# Patient Record
Sex: Female | Born: 1960 | Race: White | Hispanic: No | Marital: Single | Smoking: Current every day smoker
Health system: Southern US, Community
[De-identification: ages and names within clinical notes are randomized; demographics above are authoritative.]

## PROBLEM LIST (undated history)

## (undated) DIAGNOSIS — F4310 Post-traumatic stress disorder, unspecified: Secondary | ICD-10-CM

## (undated) DIAGNOSIS — M5136 Other intervertebral disc degeneration, lumbar region: Secondary | ICD-10-CM

## (undated) DIAGNOSIS — M549 Dorsalgia, unspecified: Secondary | ICD-10-CM

## (undated) DIAGNOSIS — F32A Depression, unspecified: Secondary | ICD-10-CM

## (undated) DIAGNOSIS — J449 Chronic obstructive pulmonary disease, unspecified: Secondary | ICD-10-CM

## (undated) DIAGNOSIS — S060XAA Concussion with loss of consciousness status unknown, initial encounter: Secondary | ICD-10-CM

## (undated) DIAGNOSIS — M51369 Other intervertebral disc degeneration, lumbar region without mention of lumbar back pain or lower extremity pain: Secondary | ICD-10-CM

## (undated) DIAGNOSIS — F329 Major depressive disorder, single episode, unspecified: Secondary | ICD-10-CM

## (undated) DIAGNOSIS — S060X9A Concussion with loss of consciousness of unspecified duration, initial encounter: Secondary | ICD-10-CM

## (undated) DIAGNOSIS — J45909 Unspecified asthma, uncomplicated: Secondary | ICD-10-CM

## (undated) HISTORY — DX: Unspecified asthma, uncomplicated: J45.909

## (undated) HISTORY — DX: Chronic obstructive pulmonary disease, unspecified: J44.9

## (undated) HISTORY — DX: Other intervertebral disc degeneration, lumbar region: M51.36

## (undated) HISTORY — PX: TUBAL LIGATION: SHX77

## (undated) HISTORY — PX: TIBIA FRACTURE SURGERY: SHX806

## (undated) HISTORY — DX: Concussion with loss of consciousness status unknown, initial encounter: S06.0XAA

## (undated) HISTORY — DX: Other intervertebral disc degeneration, lumbar region without mention of lumbar back pain or lower extremity pain: M51.369

## (undated) HISTORY — PX: WRIST SURGERY: SHX841

## (undated) HISTORY — DX: Concussion with loss of consciousness of unspecified duration, initial encounter: S06.0X9A

---

## 2009-02-11 ENCOUNTER — Emergency Department: Payer: Self-pay | Admitting: Emergency Medicine

## 2009-12-31 ENCOUNTER — Emergency Department: Payer: Self-pay | Admitting: Emergency Medicine

## 2011-03-05 ENCOUNTER — Emergency Department (HOSPITAL_COMMUNITY): Payer: Medicaid Other

## 2011-03-05 ENCOUNTER — Emergency Department (HOSPITAL_COMMUNITY)
Admission: EM | Admit: 2011-03-05 | Discharge: 2011-03-05 | Disposition: A | Discharge status: Home / Self Care | Payer: Medicaid Other | Attending: Emergency Medicine | Admitting: Emergency Medicine

## 2011-03-05 DIAGNOSIS — S2239XA Fracture of one rib, unspecified side, initial encounter for closed fracture: Secondary | ICD-10-CM | POA: Insufficient documentation

## 2011-03-05 DIAGNOSIS — W010XXA Fall on same level from slipping, tripping and stumbling without subsequent striking against object, initial encounter: Secondary | ICD-10-CM | POA: Insufficient documentation

## 2011-05-07 ENCOUNTER — Emergency Department (HOSPITAL_COMMUNITY)
Admission: EM | Admit: 2011-05-07 | Discharge: 2011-05-08 | Disposition: A | Discharge status: Home / Self Care | Payer: Self-pay | Attending: Emergency Medicine | Admitting: Emergency Medicine

## 2011-05-07 DIAGNOSIS — F329 Major depressive disorder, single episode, unspecified: Secondary | ICD-10-CM | POA: Insufficient documentation

## 2011-05-07 DIAGNOSIS — F3289 Other specified depressive episodes: Secondary | ICD-10-CM | POA: Insufficient documentation

## 2011-05-07 LAB — DIFFERENTIAL
Basophils Absolute: 0 10*3/uL (ref 0.0–0.1)
Basophils Relative: 1 % (ref 0–1)
Eosinophils Absolute: 0.1 10*3/uL (ref 0.0–0.7)
Eosinophils Relative: 3 % (ref 0–5)
Lymphocytes Relative: 54 % — ABNORMAL HIGH (ref 12–46)
Lymphs Abs: 2.8 10*3/uL (ref 0.7–4.0)
Monocytes Absolute: 0.5 10*3/uL (ref 0.1–1.0)
Monocytes Relative: 9 % (ref 3–12)
Neutro Abs: 1.8 10*3/uL (ref 1.7–7.7)
Neutrophils Relative %: 34 % — ABNORMAL LOW (ref 43–77)

## 2011-05-07 LAB — URINALYSIS, ROUTINE W REFLEX MICROSCOPIC
Bilirubin Urine: NEGATIVE
Glucose, UA: NEGATIVE mg/dL
Ketones, ur: NEGATIVE mg/dL
Nitrite: NEGATIVE
Protein, ur: NEGATIVE mg/dL
Specific Gravity, Urine: 1.006 (ref 1.005–1.030)
Urobilinogen, UA: 0.2 mg/dL (ref 0.0–1.0)
pH: 5.5 (ref 5.0–8.0)

## 2011-05-07 LAB — RAPID URINE DRUG SCREEN, HOSP PERFORMED
Amphetamines: NOT DETECTED
Barbiturates: NOT DETECTED
Benzodiazepines: NOT DETECTED
Cocaine: NOT DETECTED
Opiates: NOT DETECTED
Tetrahydrocannabinol: NOT DETECTED

## 2011-05-07 LAB — URINE MICROSCOPIC-ADD ON

## 2011-05-07 LAB — CBC
HCT: 38.3 % (ref 36.0–46.0)
Hemoglobin: 13.7 g/dL (ref 12.0–15.0)
MCH: 35.7 pg — ABNORMAL HIGH (ref 26.0–34.0)
MCHC: 35.8 g/dL (ref 30.0–36.0)
MCV: 99.7 fL (ref 78.0–100.0)
Platelets: 178 10*3/uL (ref 150–400)
RBC: 3.84 MIL/uL — ABNORMAL LOW (ref 3.87–5.11)
RDW: 12.7 % (ref 11.5–15.5)
WBC: 5.2 10*3/uL (ref 4.0–10.5)

## 2011-05-07 LAB — POCT PREGNANCY, URINE: Preg Test, Ur: NEGATIVE

## 2011-05-08 LAB — BASIC METABOLIC PANEL
BUN: 6 mg/dL (ref 6–23)
CO2: 20 mEq/L (ref 19–32)
Calcium: 8.9 mg/dL (ref 8.4–10.5)
Chloride: 95 mEq/L — ABNORMAL LOW (ref 96–112)
Creatinine, Ser: 0.62 mg/dL (ref 0.50–1.10)
GFR calc Af Amer: >60 mL/min (ref >60)
GFR calc non Af Amer: >60 mL/min (ref >60)
Glucose, Bld: 69 mg/dL — ABNORMAL LOW (ref 70–99)
Potassium: 3.8 mEq/L (ref 3.5–5.1)
Sodium: 132 mEq/L — ABNORMAL LOW (ref 135–145)

## 2011-05-08 LAB — ETHANOL: Alcohol, Ethyl (B): 294 mg/dL — ABNORMAL HIGH (ref 0–11)

## 2011-08-17 ENCOUNTER — Encounter: Payer: Self-pay | Admitting: *Deleted

## 2011-08-17 ENCOUNTER — Emergency Department (HOSPITAL_COMMUNITY)
Admission: EM | Admit: 2011-08-17 | Discharge: 2011-08-17 | Disposition: A | Discharge status: Home / Self Care | Payer: Self-pay | Attending: Emergency Medicine | Admitting: Emergency Medicine

## 2011-08-17 DIAGNOSIS — M5380 Other specified dorsopathies, site unspecified: Secondary | ICD-10-CM | POA: Insufficient documentation

## 2011-08-17 DIAGNOSIS — M545 Low back pain, unspecified: Secondary | ICD-10-CM | POA: Insufficient documentation

## 2011-08-17 DIAGNOSIS — M5430 Sciatica, unspecified side: Secondary | ICD-10-CM | POA: Insufficient documentation

## 2011-08-17 DIAGNOSIS — Z79899 Other long term (current) drug therapy: Secondary | ICD-10-CM | POA: Insufficient documentation

## 2011-08-17 DIAGNOSIS — M79609 Pain in unspecified limb: Secondary | ICD-10-CM | POA: Insufficient documentation

## 2011-08-17 HISTORY — DX: Post-traumatic stress disorder, unspecified: F43.10

## 2011-08-17 MED ORDER — NAPROXEN 500 MG PO TABS: 500.0000 mg | ORAL_TABLET | Freq: Two times a day (BID) | ORAL | Status: DC

## 2011-08-17 MED ORDER — CYCLOBENZAPRINE HCL 5 MG PO TABS: 5.0000 mg | ORAL_TABLET | Freq: Three times a day (TID) | ORAL | Status: AC | PRN

## 2011-08-17 MED ORDER — HYDROCODONE-ACETAMINOPHEN 5-325 MG PO TABS: 1.0000 | ORAL_TABLET | Freq: Four times a day (QID) | ORAL | Status: AC | PRN

## 2011-08-17 NOTE — ED Notes (Signed)
x2 weeks lower back pain' "pinched nerve." pain radiating down rt. leg

## 2011-08-17 NOTE — ED Provider Notes (Signed)
History     CSN: [id number] Arrival date & time: 08/17/2011  8:57 AM   First MD Initiated Contact with Patient 08/17/11 [phone]      No chief complaint on file.   (Consider location/radiation/quality/duration/timing/severity/associated sxs/prior treatment) HPI Comments: The pain started after doing some lifting and moving.  Patient denies any falls. She does feel like the pain is sharp it shoots down her left leg and sometimes it feels like her leg will give out on her.  Patient is a 50 y.o. female presenting with back pain. The history is provided by the patient.  Back Pain  This is a new problem. Episode onset: The pain has been ongoing for several weeks. The problem has been gradually worsening. The pain is associated with lifting heavy objects. The pain is present in the lumbar spine. The quality of the pain is described as shooting and stabbing. The pain is moderate. The symptoms are aggravated by bending and certain positions. Associated symptoms include leg pain. Pertinent negatives include no fever, no abdominal swelling, no perianal numbness, no dysuria, no paresis and no weakness. She has tried NSAIDs and heat for the symptoms. The treatment provided mild relief.    No past medical history on file.  No past surgical history on file.  No family history on file.  History  Substance Use Topics  . Smoking status: Not on file  . Smokeless tobacco: Not on file  . Alcohol Use: Not on file    OB History    No data available      Review of Systems  Constitutional: Negative for fever.  Genitourinary: Negative for dysuria.  Musculoskeletal: Positive for back pain.  Neurological: Negative for weakness.  All other systems reviewed and are negative.    Allergies  Review of patient's allergies indicates no known allergies.  Home Medications   Current Outpatient Rx  Name Route Sig Dispense Refill  . HYDROXYZINE HCL 25 MG PO TABS Oral Take 25 mg by mouth 4 (four) times  daily as needed. For anxiety or sleep     . SERTRALINE HCL 100 MG PO TABS Oral Take 50 mg by mouth daily.      . CYCLOBENZAPRINE HCL 5 MG PO TABS Oral Take 1 tablet (5 mg total) by mouth 3 (three) times daily as needed for muscle spasms. 21 tablet 0  . HYDROCODONE-ACETAMINOPHEN 5-325 MG PO TABS Oral Take 1 tablet by mouth every 6 (six) hours as needed for pain. 20 tablet 0  . NAPROXEN 500 MG PO TABS Oral Take 1 tablet (500 mg total) by mouth 2 (two) times daily with a meal. As needed for pain 20 tablet 0    BP 113/80  Pulse 110  Temp(Src) 97 F (36.1 C) (Oral)  Resp 20  SpO2 96%  Physical Exam  Nursing note and vitals reviewed. Constitutional: She appears well-developed and well-nourished.  HENT:  Head: Normocephalic and atraumatic.  Right Ear: External ear normal.  Left Ear: External ear normal.  Nose: Nose normal.  Eyes: Conjunctivae and EOM are normal.  Neck: Neck supple. No tracheal deviation present.  Pulmonary/Chest: Effort normal. No stridor. No respiratory distress.  Abdominal: She exhibits no distension. There is no tenderness.  Musculoskeletal: She exhibits no edema and no tenderness.       Lumbar back: She exhibits decreased range of motion, tenderness, pain and spasm. She exhibits no swelling and no edema.  Neurological: She is alert. She is not disoriented. No cranial nerve deficit or sensory deficit.  She exhibits normal muscle tone. Coordination normal.  Reflex Scores:      Patellar reflexes are 2+ on the right side and 2+ on the left side.      Achilles reflexes are 2+ on the right side and 2+ on the left side. Skin: Skin is warm and dry. No rash noted. She is not diaphoretic. No erythema.  Psychiatric: She has a normal mood and affect. Her behavior is normal. Thought content normal.    ED Course  Procedures (including critical care time)  Labs Reviewed - No data to display No results found.   1. Sciatica       MDM  No sign of acute neurological or  vascular emergency associated with pt's back pain.  May have a component of sciatica.  Safe for outpatient follow up.         Celene Kras, MD 08/17/11 [phone]

## 2011-09-30 ENCOUNTER — Emergency Department: Payer: Self-pay | Admitting: Unknown Physician Specialty

## 2011-10-22 ENCOUNTER — Other Ambulatory Visit: Payer: Self-pay | Admitting: Anesthesiology

## 2011-10-22 DIAGNOSIS — M79604 Pain in right leg: Secondary | ICD-10-CM

## 2011-10-24 ENCOUNTER — Ambulatory Visit
Admission: RE | Admit: 2011-10-24 | Discharge: 2011-10-24 | Disposition: A | Discharge status: Home / Self Care | Payer: Self-pay | Source: Ambulatory Visit | Attending: Anesthesiology | Admitting: Anesthesiology

## 2011-10-24 DIAGNOSIS — M79605 Pain in left leg: Secondary | ICD-10-CM

## 2011-10-24 DIAGNOSIS — M79604 Pain in right leg: Secondary | ICD-10-CM

## 2011-10-25 ENCOUNTER — Other Ambulatory Visit: Payer: Self-pay

## 2011-11-29 ENCOUNTER — Encounter (HOSPITAL_COMMUNITY): Payer: Self-pay | Admitting: Physical Medicine and Rehabilitation

## 2011-11-29 ENCOUNTER — Emergency Department (HOSPITAL_COMMUNITY)
Admission: EM | Admit: 2011-11-29 | Discharge: 2011-11-29 | Disposition: A | Discharge status: Home / Self Care | Payer: Self-pay | Attending: Emergency Medicine | Admitting: Emergency Medicine

## 2011-11-29 DIAGNOSIS — G8929 Other chronic pain: Secondary | ICD-10-CM | POA: Insufficient documentation

## 2011-11-29 DIAGNOSIS — M47817 Spondylosis without myelopathy or radiculopathy, lumbosacral region: Secondary | ICD-10-CM | POA: Insufficient documentation

## 2011-11-29 DIAGNOSIS — F17200 Nicotine dependence, unspecified, uncomplicated: Secondary | ICD-10-CM | POA: Insufficient documentation

## 2011-11-29 DIAGNOSIS — F4310 Post-traumatic stress disorder, unspecified: Secondary | ICD-10-CM | POA: Insufficient documentation

## 2011-11-29 HISTORY — DX: Major depressive disorder, single episode, unspecified: F32.9

## 2011-11-29 HISTORY — DX: Depression, unspecified: F32.A

## 2011-11-29 LAB — URINALYSIS, ROUTINE W REFLEX MICROSCOPIC
Bilirubin Urine: NEGATIVE
Glucose, UA: NEGATIVE mg/dL
Hgb urine dipstick: NEGATIVE
Ketones, ur: NEGATIVE mg/dL
Nitrite: NEGATIVE
Protein, ur: NEGATIVE mg/dL
Specific Gravity, Urine: 1.008 (ref 1.005–1.030)
Urobilinogen, UA: 0.2 mg/dL (ref 0.0–1.0)
pH: 7 (ref 5.0–8.0)

## 2011-11-29 LAB — URINE MICROSCOPIC-ADD ON

## 2011-11-29 MED ORDER — HYDROCODONE-ACETAMINOPHEN 5-325 MG PO TABS
1.0000 | ORAL_TABLET | Freq: Once | ORAL | Status: AC
  Administered 2011-11-29: 1 via ORAL
  Filled 2011-11-29: qty 1

## 2011-11-29 MED ORDER — HYDROCODONE-ACETAMINOPHEN 5-325 MG PO TABS: 1.0000 | ORAL_TABLET | Freq: Four times a day (QID) | ORAL | Status: AC | PRN

## 2011-11-29 NOTE — ED Provider Notes (Signed)
History     CSN: [id number]  Arrival date & time 11/29/11  1013   First MD Initiated Contact with Patient 11/29/11 1024      Chief Complaint  Patient presents with  . Back Pain    (Consider location/radiation/quality/duration/timing/severity/associated sxs/prior treatment) HPI The patient is a 51 yo woman, presenting with back pain.  She notes a history of lower back pain, with shooting pain radiating to both legs, since an injury while moving heavy furniture in 08/2011.  She later had another accident involving tripping over a rope.  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  Since then, the patient has been taking tramadol and flexeril, which initially helped with the pain.  However, now her pain has been gradually increasing, causing her to seek medical attention.  She notes no new injury since her last MRI, and no loss of bowel/bladder function or motor dysfunction.  Past Medical History  Diagnosis Date  . Post traumatic stress disorder (PTSD)   . Depression     History reviewed. No pertinent past surgical history.  History reviewed. No pertinent family history.  History  Substance Use Topics  . Smoking status: Current Everyday Smoker    Types: Cigarettes  . Smokeless tobacco: Not on file  . Alcohol Use: Yes    OB History    Grav Para Term Preterm Abortions TAB SAB Ect Mult Living                  Review of Systems ROS General: no fevers, chills, changes in weight, changes in appetite Skin: no rash HEENT: no blurry vision, hearing changes, sore throat Pulm: no dyspnea, coughing, wheezing CV: no chest pain, palpitations, shortness of breath Abd: no abdominal pain, nausea/vomiting, diarrhea/constipation GU: no dysuria, hematuria, polyuria Ext: no arthralgias, myalgias Neuro: no  weakness, numbness, or tingling  Allergies  Review of patient's allergies indicates no known allergies.  Home Medications   Current Outpatient Rx  Name Route Sig Dispense Refill  . CYCLOBENZAPRINE HCL 10 MG PO TABS Oral Take 10 mg by mouth every 12 (twelve) hours as needed. As needed for muscle spasms.    Marland Kitchen HYDROXYZINE HCL 25 MG PO TABS Oral Take 25 mg by mouth 4 (four) times daily as needed. For anxiety or sleep     . SERTRALINE HCL 100 MG PO TABS Oral Take 50 mg by mouth daily.      . TRAMADOL HCL 50 MG PO TABS Oral Take 50 mg by mouth every 12 (twelve) hours as needed. As needed for pain.      BP 147/89  Pulse 103  Temp(Src) 97.7 F (36.5 C) (Oral)  Resp 20  SpO2 99%  LMP 06/17/2011  Physical Exam General: alert, cooperative, and in no apparent distress HEENT: pupils equal round and reactive to light, vision grossly intact, oropharynx clear and non-erythematous  Neck: supple, no lymphadenopathy Lungs: clear to ascultation bilaterally, normal work of respiration, no wheezes, rales, ronchi Heart: regular rate and rhythm, no murmurs, gallops, or rubs Abdomen: soft, non-tender, non-distended, normal bowel sounds   Back: ttp at L4-L5 vertebrae, mild tenderness to paraspinal muscles Extremities: 2+ DP/PT pulses bilaterally, no cyanosis, clubbing, or edema Neurologic: alert & oriented X3, cranial nerves II-XII intact, strength 5/5 in all 4 extremities, sensation intact to light touch, reflexes 2+ throughout bilateral LE  ED Course  Procedures (including critical care time)   Labs Reviewed  URINALYSIS, ROUTINE W REFLEX MICROSCOPIC   No results found.   No diagnosis found.    MDM  The patient is a 51 yo woman, history of back pain, presenting with persistent back pain.  # Back pain - she notes chronic back pain, with MRI imaging 1 month ago showing vertebral changes.  Flexeril and tramadol is giving diminishing results.  Unfortunately, this is a chronic problem, that will  require multiple visits to a PCP, and possibly an Orthopedic Surgeon +/- a pain clinic.  The first step will be establishing a PCP, as the patient is currently dissatisfied with her current provider. -hydrocodone now for pain -will prescribe short-term course of hydrocodone for home use -patient provided with list of PCP's in the area  # Dispo - discharge, patient to establish care with PCP   Linward Headland, MD 11/29/11 1100

## 2011-11-29 NOTE — ED Notes (Signed)
Pt presents to department for evaluation of lower back pain. Ongoing x1 month. Pt states she tripped on rope and injured back. 9/10 pain upon arrival to ED. Pt also states urinary frequency. Pain becomes with walking. No relief from tramadol at home. She is conscious alert and oriented x4.

## 2011-11-29 NOTE — Discharge Instructions (Signed)
RESOURCE GUIDE  Dental Problems  Patients with Medicaid: [facility]                     [facility] [address]                                           [address] Phone:  [phone]                                                  Phone:  [phone]  If unable to pay or uninsured, contact:  [facility] or [facility] to become qualified for the adult dental clinic.  Chronic Pain Problems Contact [facility]  [phone] Patients need to be referred by their primary care doctor.  Insufficient Money for Medicine Contact [employer]:  call "211" or [facility] 271-5999.  No Primary Care Doctor Call [facility]  832-8000 Other agencies that provide inexpensive medical care    [facility]  [phone]    [facility]  [phone]    [facility]  [phone]    Women's Clinic  [phone]    [facility]  [phone]    [facility]  [phone]  Psychological Services [facility]  [phone] [facility]  [phone] [facility]   800 853-5163 (emergency services 641-4993)  Substance Abuse Resources [facility]  336-882-2125 [facility] 336-784-9470 [facility] 336-285-9073 [facility] 336-845-3988 Residential & Outpatient Substance Abuse Program  800-659-3381  Abuse/Neglect [place] Child Abuse Hotline (336) 641-3795 [place] Child Abuse Hotline 800-378-5315 (After Hours)  Emergency Shelter [facility] (336) 271-5985  Maternity Homes [facility] (336) 275-9566 [facility] (704) 372-4663  MRSA Hotline #:   832-7006    [place] [facility]  [facility] [place] [facility]     [employer]                          [facility] [address] [place]                       [address]      [address]  [place]                                                [place]                            [place] Phone:  [phone]                                   Phone:  [phone]                 Phone:  [phone]  [facility] Phone:  [phone]    [facility] Child Abuse Hotline 915-285-0409 (281)303-0085 (After Hours)   Chronic Back Pain When back pain lasts longer than 3 months, it is called chronic back pain.This pain can be frustrating, but the cause of the pain is rarely dangerous.People with chronic back pain often go through certain periods that are more intense (flare-ups). CAUSES Chronic back pain can be caused by wear and tear (degeneration) on different structures in your back. These structures may include bones, ligaments, or discs. This degeneration may result in more pressure being placed on the nerves that travel to your legs and feet. This can lead to pain traveling from the low back down the back of the legs. When pain lasts longer than 3 months, it is not unusual for people to experience anxiety or depression. Anxiety and depression can also contribute to low back pain. TREATMENT  Establish a regular exercise plan. This is critical to improving your functional level.   Have a self-management plan for when you flare-up. Flare-ups rarely require a medical visit. Regular exercise will help reduce the intensity and frequency of your flare-ups.   Manage how you feel about your back pain and the rest of your life. Anxiety, depression, and feeling that you cannot alter your back pain have been shown to make back pain more intense and debilitating.   Medicines should never be your only treatment. They should be used along with other treatments to help you return to a more active lifestyle.   Procedures such as injections or surgery may be helpful but are rarely necessary. You may be able to get the same results with physical therapy or  chiropractic care.  HOME CARE INSTRUCTIONS  Avoid bending, heavy lifting, prolonged sitting, and activities which make the problem worse.   Continue normal activity as much as possible.   Take brief periods of rest throughout the day to reduce your pain during flare-ups.   Follow your back exercise rehabilitation program. This can help reduce symptoms and prevent more pain.   Only take over-the-counter or prescription medicines as directed by your caregiver. Muscle relaxants are sometimes prescribed. Narcotic pain medicine is discouraged for long-term pain, since addiction is a possible outcome.   If you smoke, quit.   Eat healthy foods and maintain a recommended body weight.  SEEK IMMEDIATE MEDICAL CARE IF:   You have weakness or numbness in one of your legs or feet.   You have trouble controlling your bladder or bowels.   You develop nausea, vomiting, abdominal pain, shortness of breath, or fainting.  Document Released: 09/27/2004 Document Revised: 08/09/2011 Document Reviewed: 08/04/2011 [facility] Patient Information 2012 [place], [place].

## 2011-11-29 NOTE — ED Provider Notes (Signed)
I saw and evaluated the patient, reviewed the resident's note and I agree with the findings and plan.  I saw the patient along with Dr. Manson Passey and agree with his note, assessment, and plan.  The patient presents with back pain and has a known history of abnormal mri last month.  She is having difficulty obtaining follow up due to financial reasons.  Her pain worsened several days ago and she has no relief with tramadol and flexeril.  There are no bowel or bladder complaints.  On exam, the patient appears uncomfortable but the vitals are stable. DTR's are symmetrical and strength is 5/5 in ble.  She is ambulatory without difficulty.  She will be given lortab for pain and is given the resource guide to help her to arrange follow up.  Geoffery Lyons, MD 11/29/11 1525

## 2011-12-26 ENCOUNTER — Emergency Department (HOSPITAL_COMMUNITY)
Admission: EM | Admit: 2011-12-26 | Discharge: 2011-12-26 | Disposition: A | Discharge status: Home / Self Care | Payer: Self-pay | Attending: Emergency Medicine | Admitting: Emergency Medicine

## 2011-12-26 ENCOUNTER — Encounter (HOSPITAL_COMMUNITY): Payer: Self-pay | Admitting: *Deleted

## 2011-12-26 DIAGNOSIS — M79609 Pain in unspecified limb: Secondary | ICD-10-CM | POA: Insufficient documentation

## 2011-12-26 DIAGNOSIS — IMO0001 Reserved for inherently not codable concepts without codable children: Secondary | ICD-10-CM | POA: Insufficient documentation

## 2011-12-26 DIAGNOSIS — G8929 Other chronic pain: Secondary | ICD-10-CM | POA: Insufficient documentation

## 2011-12-26 DIAGNOSIS — M549 Dorsalgia, unspecified: Secondary | ICD-10-CM

## 2011-12-26 DIAGNOSIS — M545 Low back pain, unspecified: Secondary | ICD-10-CM | POA: Insufficient documentation

## 2011-12-26 DIAGNOSIS — F329 Major depressive disorder, single episode, unspecified: Secondary | ICD-10-CM | POA: Insufficient documentation

## 2011-12-26 DIAGNOSIS — IMO0002 Reserved for concepts with insufficient information to code with codable children: Secondary | ICD-10-CM | POA: Insufficient documentation

## 2011-12-26 DIAGNOSIS — Z79899 Other long term (current) drug therapy: Secondary | ICD-10-CM | POA: Insufficient documentation

## 2011-12-26 DIAGNOSIS — F3289 Other specified depressive episodes: Secondary | ICD-10-CM | POA: Insufficient documentation

## 2011-12-26 DIAGNOSIS — M5416 Radiculopathy, lumbar region: Secondary | ICD-10-CM

## 2011-12-26 DIAGNOSIS — F17200 Nicotine dependence, unspecified, uncomplicated: Secondary | ICD-10-CM | POA: Insufficient documentation

## 2011-12-26 DIAGNOSIS — Z7982 Long term (current) use of aspirin: Secondary | ICD-10-CM | POA: Insufficient documentation

## 2011-12-26 MED ORDER — HYDROCODONE-ACETAMINOPHEN 5-500 MG PO TABS: 1.0000 | ORAL_TABLET | Freq: Four times a day (QID) | ORAL | Status: AC | PRN

## 2011-12-26 MED ORDER — HYDROCODONE-ACETAMINOPHEN 5-325 MG PO TABS
2.0000 | ORAL_TABLET | Freq: Once | ORAL | Status: AC
  Administered 2011-12-26: 2 via ORAL
  Filled 2011-12-26: qty 2

## 2011-12-26 MED ORDER — NAPROXEN 375 MG PO TABS: 375.0000 mg | ORAL_TABLET | Freq: Two times a day (BID) | ORAL | Status: AC

## 2011-12-26 NOTE — ED Provider Notes (Signed)
History     CSN: [id number]  Arrival date & time 12/26/11  1712   First MD Initiated Contact with Patient 12/26/11 1732      Chief Complaint  Patient presents with  . Back Pain    (Consider location/radiation/quality/duration/timing/severity/associated sxs/prior treatment) HPI  Patient presents to emergency department with complaint of left lower back pain with radiation of pain into her left buttock and leg. Patient states that she has been seen in emergency department on numerous occasions for this pain. She was last seen last month and was told that she needed to establish care with primary care provider. Patient states that she has a appointment in the near future to establish her orange card and to get her primary care provider. Patient states that over the last few months she's had multiple episodes of flares of her chronic pain. Patient states her current symptoms are consistent with her flares her chronic pain. Patient states that initially, multiple month ago, the pain was aggravated by sudden movement. However today patient states that she was standing in her kitchen and twisted to look behind her when the pain began once again. Patient denies extremity numbness/tingling/weakness, saddle seat paresthesias, loss of bowel or bladder function. She denies any abdominal pain, dysuria, or flank pain. Patient states that she has taken Vicodin, Percocet, and Ultram in the past for her pain. Patient states she most recently took day an Ultram without relief of pain. Pain is aggravated by movement. Past Medical History  Diagnosis Date  . Post traumatic stress disorder (PTSD)   . Depression     History reviewed. No pertinent past surgical history.  History reviewed. No pertinent family history.  History  Substance Use Topics  . Smoking status: Current Everyday Smoker    Types: Cigarettes  . Smokeless tobacco: Not on file  . Alcohol Use: Yes    OB History    Grav Para Term Preterm  Abortions TAB SAB Ect Mult Living                  Review of Systems  All other systems reviewed and are negative.    Allergies  Review of patient's allergies indicates no known allergies.  Home Medications   Current Outpatient Rx  Name Route Sig Dispense Refill  . ASPIRIN EC 81 MG PO TBEC Oral Take 81 mg by mouth daily.    . CYCLOBENZAPRINE HCL 10 MG PO TABS Oral Take 10 mg by mouth every 12 (twelve) hours as needed. As needed for muscle spasms.    Marland Kitchen HYDROXYZINE HCL 25 MG PO TABS Oral Take 25 mg by mouth 4 (four) times daily as needed. For anxiety or sleep     . SERTRALINE HCL 100 MG PO TABS Oral Take 100 mg by mouth daily.     . TRAMADOL HCL 50 MG PO TABS Oral Take 50 mg by mouth every 12 (twelve) hours as needed. As needed for pain.    Marland Kitchen HYDROCODONE-ACETAMINOPHEN 5-500 MG PO TABS Oral Take 1-2 tablets by mouth every 6 (six) hours as needed for pain. 20 tablet 0  . NAPROXEN 375 MG PO TABS Oral Take 1 tablet (375 mg total) by mouth 2 (two) times daily. 30 tablet 0    BP 132/90  Pulse 112  Temp(Src) 98.2 F (36.8 C) (Oral)  Resp 18  SpO2 97%  LMP 06/17/2011  Physical Exam  Nursing note and vitals reviewed. Constitutional: She is oriented to person, place, and time. She appears well-developed and  well-nourished. No distress.  HENT:  Head: Normocephalic and atraumatic.  Eyes: Conjunctivae are normal.  Neck: Normal range of motion. Neck supple.  Cardiovascular: Normal rate, regular rhythm, normal heart sounds and intact distal pulses.  Exam reveals no gallop and no friction rub.   No murmur heard. Pulmonary/Chest: Effort normal and breath sounds normal. No respiratory distress. She has no wheezes. She has no rales. She exhibits no tenderness.  Abdominal: Soft. Bowel sounds are normal. She exhibits no distension and no mass. There is no tenderness. There is no rebound and no guarding.  Musculoskeletal: Normal range of motion. She exhibits tenderness. She exhibits no edema.        Tenderness to palpation of left lumbar Pierce spinal and sacral region without skin changes, rash, or crepitus. Full range of motion of bilateral hips with pain in left lower back. 5 out of 5 strength bilaterally. Normal reflexes. Good femoral pulses bilaterally.  Neurological: She is alert and oriented to person, place, and time. She has normal reflexes.  Skin: Skin is warm and dry. No rash noted. She is not diaphoretic. No erythema.  Psychiatric: She has a normal mood and affect.    ED Course  Procedures (including critical care time)  By mouth Percocet  Labs Reviewed - No data to display No results found.   1. Chronic back pain   2. Lumbar radicular pain       MDM  Patient has history of chronic left lower back pain and lumbar radiculopathy. Patient states pain is consistent with her chronic pain. She has no signs or symptoms of central cord compression or cauda equina. She denies flank pain, urinary symptoms, or abdominal pain. Once again I spoke at length with patient about the importance of primary care provider and or having a limited number of providers for chronic pain management. Patient voices her understanding and states that she has an appointment in the near future, may first, to establish her orange card and her primary care provider.        Jenness [place], [place] 12/26/11 2215

## 2011-12-26 NOTE — ED Notes (Signed)
PT STATES SHE RAN OUT OF VICODIN LAST WEEK SHE THINKS. STATES SHE DID TAKE 3 TRAMADOL TODAY AND A FLEXERIL BUT CONTINUES TO HAVE PAIN. SHE DENIES INJURY TO HER BACK, STATES HAS BEEN TRYING TO GET HER ORANGE CARD AND HAS APPT ON MAY 1ST

## 2011-12-26 NOTE — ED Notes (Signed)
Reports lower back pain for several months, ambulatory at triage.

## 2011-12-26 NOTE — Discharge Instructions (Signed)
Quit smoking. Use naprosyn as directed for inflammation and pain with vicodin for breakthrough pain. A daily stretch routine has been shown to have great benefit for chronic low back pain and lumbar radiculopathy. Establishment with a Primary Care provider is Very important for general health care concerns, minor illness and minor injury and for further evaluation and management of your chronic back. Return to ER for emergent changing or worsening of symptoms.    Radicular Pain Radicular pain in either the arm or leg is usually from a bulging or herniated disk in the spine. A piece of the herniated disk may press against the nerves as the nerves exit the spine. This causes pain which is felt at the tips of the nerves down the arm or leg. Other causes of radicular pain may include:  Fractures.   Heart disease.   Cancer.   An abnormal and usually degenerative state of the nervous system or nerves (neuropathy).  Diagnosis may require CT or MRI scanning to determine the primary cause.  Nerves that start at the neck (nerve roots) may cause radicular pain in the outer shoulder and arm. It can spread down to the thumb and fingers. The symptoms vary depending on which nerve root has been affected. In most cases radicular pain improves with conservative treatment. Neck problems may require physical therapy, a neck collar, or cervical traction. Treatment may take many weeks, and surgery may be considered if the symptoms do not improve.  Conservative treatment is also recommended for sciatica. Sciatica causes pain to radiate from the lower back or buttock area down the leg into the foot. Often there is a history of back problems. Most patients with sciatica are better after 2 to 4 weeks of rest and other supportive care. Short term bed rest can reduce the disk pressure considerably. Sitting, however, is not a good position since this increases the pressure on the disk. You should avoid bending, lifting, and all  other activities which make the problem worse. Traction can be used in severe cases. Surgery is usually reserved for patients who do not improve within the first months of treatment. Only take over-the-counter or prescription medicines for pain, discomfort, or fever as directed by your caregiver. Narcotics and muscle relaxants may help by relieving more severe pain and spasm and by providing mild sedation. Cold or massage can give significant relief. Spinal manipulation is not recommended. It can increase the degree of disc protrusion. Epidural steroid injections are often effective treatment for radicular pain. These injections deliver medicine to the spinal nerve in the space between the protective covering of the spinal cord and back bones (vertebrae). Your caregiver can give you more information about steroid injections. These injections are most effective when given within two weeks of the onset of pain.  You should see your caregiver for follow up care as recommended. A program for neck and back injury rehabilitation with stretching and strengthening exercises is an important part of management.  SEEK IMMEDIATE MEDICAL CARE IF:  You develop increased pain, weakness, or numbness in your arm or leg.   You develop difficulty with bladder or bowel control.   You develop abdominal pain.  Document Released: 09/27/2004 Document Revised: 08/09/2011 Document Reviewed: 12/13/2008 [facility] Patient Information 2012 [place], [place].  Chronic Back Pain When back pain lasts longer than 3 months, it is called chronic back pain.This pain can be frustrating, but the cause of the pain is rarely dangerous.People with chronic back pain often go through certain periods that  are more intense (flare-ups). CAUSES Chronic back pain can be caused by wear and tear (degeneration) on different structures in your back. These structures may include bones, ligaments, or discs. This degeneration may result in more pressure  being placed on the nerves that travel to your legs and feet. This can lead to pain traveling from the low back down the back of the legs. When pain lasts longer than 3 months, it is not unusual for people to experience anxiety or depression. Anxiety and depression can also contribute to low back pain. TREATMENT  Establish a regular exercise plan. This is critical to improving your functional level.   Have a self-management plan for when you flare-up. Flare-ups rarely require a medical visit. Regular exercise will help reduce the intensity and frequency of your flare-ups.   Manage how you feel about your back pain and the rest of your life. Anxiety, depression, and feeling that you cannot alter your back pain have been shown to make back pain more intense and debilitating.   Medicines should never be your only treatment. They should be used along with other treatments to help you return to a more active lifestyle.   Procedures such as injections or surgery may be helpful but are rarely necessary. You may be able to get the same results with physical therapy or chiropractic care.  HOME CARE INSTRUCTIONS  Avoid bending, heavy lifting, prolonged sitting, and activities which make the problem worse.   Continue normal activity as much as possible.   Take brief periods of rest throughout the day to reduce your pain during flare-ups.   Follow your back exercise rehabilitation program. This can help reduce symptoms and prevent more pain.   Only take over-the-counter or prescription medicines as directed by your caregiver. Muscle relaxants are sometimes prescribed. Narcotic pain medicine is discouraged for long-term pain, since addiction is a possible outcome.   If you smoke, quit.   Eat healthy foods and maintain a recommended body weight.  SEEK IMMEDIATE MEDICAL CARE IF:   You have weakness or numbness in one of your legs or feet.   You have trouble controlling your bladder or bowels.   You  develop nausea, vomiting, abdominal pain, shortness of breath, or fainting.  Document Released: 09/27/2004 Document Revised: 08/09/2011 Document Reviewed: 08/04/2011 [facility] Patient Information 2012 [place], [place].  Back Exercises Back exercises help treat and prevent back injuries. The goal of back exercises is to increase the strength of your abdominal and back muscles and the flexibility of your back. These exercises should be started when you no longer have back pain. Back exercises include:  Pelvic Tilt. Lie on your back with your knees bent. Tilt your pelvis until the lower part of your back is against the floor. Hold this position 5 to 10 sec and repeat 5 to 10 times.   Knee to Chest. Pull first 1 knee up against your chest and hold for 20 to 30 seconds, repeat this with the other knee, and then both knees. This may be done with the other leg straight or bent, whichever feels better.   Sit-Ups or Curl-Ups. Bend your knees 90 degrees. Start with tilting your pelvis, and do a partial, slow sit-up, lifting your trunk only 30 to 45 degrees off the floor. Take at least 2 to 3 seconds for each sit-up. Do not do sit-ups with your knees out straight. If partial sit-ups are difficult, simply do the above but with only tightening your abdominal muscles and holding it  as directed.   Hip-Lift. Lie on your back with your knees flexed 90 degrees. Push down with your feet and shoulders as you raise your hips a couple inches off the floor; hold for 10 seconds, repeat 5 to 10 times.   Back arches. Lie on your stomach, propping yourself up on bent elbows. Slowly press on your hands, causing an arch in your low back. Repeat 3 to 5 times. Any initial stiffness and discomfort should lessen with repetition over time.   Shoulder-Lifts. Lie face down with arms beside your body. Keep hips and torso pressed to floor as you slowly lift your head and shoulders off the floor.  Do not overdo your exercises, especially  in the beginning. Exercises may cause you some mild back discomfort which lasts for a few minutes; however, if the pain is more severe, or lasts for more than 15 minutes, do not continue exercises until you see your caregiver. Improvement with exercise therapy for back problems is slow.  See your caregivers for assistance with developing a proper back exercise program. Document Released: 09/27/2004 Document Revised: 08/09/2011 Document Reviewed: 08/20/2005 [facility] Patient Information 2012 [place], [place].

## 2011-12-27 NOTE — ED Provider Notes (Signed)
Medical screening examination/treatment/procedure(s) were performed by non-physician practitioner and as supervising physician I was immediately available for consultation/collaboration.  Azalyn Sliwa, MD 12/27/11 1509 

## 2013-01-09 ENCOUNTER — Emergency Department (HOSPITAL_COMMUNITY)
Admission: EM | Admit: 2013-01-09 | Discharge: 2013-01-09 | Disposition: A | Discharge status: Home / Self Care | Payer: Medicaid Other | Attending: Emergency Medicine | Admitting: Emergency Medicine

## 2013-01-09 ENCOUNTER — Encounter (HOSPITAL_COMMUNITY): Payer: Self-pay | Admitting: *Deleted

## 2013-01-09 ENCOUNTER — Emergency Department (HOSPITAL_COMMUNITY): Payer: Medicaid Other

## 2013-01-09 DIAGNOSIS — M545 Low back pain, unspecified: Secondary | ICD-10-CM

## 2013-01-09 DIAGNOSIS — IMO0002 Reserved for concepts with insufficient information to code with codable children: Secondary | ICD-10-CM | POA: Insufficient documentation

## 2013-01-09 DIAGNOSIS — F17200 Nicotine dependence, unspecified, uncomplicated: Secondary | ICD-10-CM | POA: Insufficient documentation

## 2013-01-09 DIAGNOSIS — Y929 Unspecified place or not applicable: Secondary | ICD-10-CM | POA: Insufficient documentation

## 2013-01-09 DIAGNOSIS — R296 Repeated falls: Secondary | ICD-10-CM | POA: Insufficient documentation

## 2013-01-09 DIAGNOSIS — W19XXXA Unspecified fall, initial encounter: Secondary | ICD-10-CM

## 2013-01-09 DIAGNOSIS — Z8659 Personal history of other mental and behavioral disorders: Secondary | ICD-10-CM | POA: Insufficient documentation

## 2013-01-09 DIAGNOSIS — S79919A Unspecified injury of unspecified hip, initial encounter: Secondary | ICD-10-CM | POA: Insufficient documentation

## 2013-01-09 DIAGNOSIS — Y939 Activity, unspecified: Secondary | ICD-10-CM | POA: Insufficient documentation

## 2013-01-09 MED ORDER — TRAMADOL HCL 50 MG PO TABS: 50.0000 mg | ORAL_TABLET | Freq: Four times a day (QID) | ORAL | Status: DC | PRN

## 2013-01-09 MED ORDER — CYCLOBENZAPRINE HCL 10 MG PO TABS: 10.0000 mg | ORAL_TABLET | Freq: Two times a day (BID) | ORAL | Status: DC | PRN

## 2013-01-09 NOTE — ED Provider Notes (Signed)
History     CSN: [id number]  Arrival date & time 01/09/13  1148   First MD Initiated Contact with Patient 01/09/13 1300      Chief Complaint  Patient presents with  . Fall    (Consider location/radiation/quality/duration/timing/severity/associated sxs/prior treatment) HPI Jill Poole is a 52 y.o. female who presents to the ED with low back and hip pain. She has a history of chronic pain and nerve pain. Her PCP will no longer give her pain medication until she sees a doctor for her pain. She reports that this is a new problem today. Because of her "nerve problem" she sometimes falls. This time when she fell on her buttocks she had pain in her lower back and left buttock. The history was provided by the patient.   Past Medical History  Diagnosis Date  . Post traumatic stress disorder (PTSD)   . Depression     Past Surgical History  Procedure Laterality Date  . Tibia fracture surgery    . Wrist surgery    . Tubal ligation      History reviewed. No pertinent family history.  History  Substance Use Topics  . Smoking status: Current Every Day Smoker    Types: Cigarettes  . Smokeless tobacco: Not on file  . Alcohol Use: Yes    OB History   Grav Para Term Preterm Abortions TAB SAB Ect Mult Living                  Review of Systems  Constitutional: Negative for fever and chills.  HENT: Negative for neck pain.   Respiratory: Negative for shortness of breath.   Cardiovascular: Negative for chest pain.  Gastrointestinal: Negative for nausea and vomiting.  Genitourinary: Negative for dysuria, urgency and frequency.  Musculoskeletal: Positive for back pain.  Skin: Negative for wound.  Neurological: Negative for syncope and headaches.  Psychiatric/Behavioral:       Hx of depression     Allergies  Review of patient's allergies indicates no known allergies.  Home Medications  No current outpatient prescriptions on file.  BP 132/88  Temp(Src) 97.5 F (36.4 C)  (Oral)  Resp 20  Ht 5\' 4"  (1.626 m)  Wt 135 lb (61.236 kg)  BMI 23.16 kg/m2  LMP 06/17/2011  Physical Exam  Nursing note and vitals reviewed. Constitutional: She is oriented to person, place, and time. She appears well-developed and well-nourished.  HENT:  Head: Normocephalic and atraumatic.  Eyes: EOM are normal.  Neck: Normal range of motion. Neck supple.  Pulmonary/Chest: Effort normal.  Abdominal: Soft. Bowel sounds are normal. There is no tenderness.  Musculoskeletal:       Lumbar back: She exhibits decreased range of motion, tenderness and spasm. She exhibits no deformity and no laceration.       Back:  Tenderness and muscle spasm noted in the left lower lumbar area. Pain with range of motion. Pain over the left sciatic nerve.  Neurological: She is alert and oriented to person, place, and time. She has normal strength and normal reflexes. No cranial nerve deficit or sensory deficit.  Pedal pulses equal bilateral. Adequate circulation, good touch sensation. Straight leg raises without difficulty. Complains of pain with leg raise on the left.   Skin: Skin is warm and dry.  Psychiatric: She has a normal mood and affect.    ED Course  Procedures (including critical care time)  Labs Reviewed - No data to display Dg Lumbar Spine Complete  01/09/2013  *RADIOLOGY REPORT*  Clinical  Data: Fall.  Back pain.  LUMBAR SPINE - COMPLETE 4+ VIEW  Comparison: 10/24/2011 lumbar spine MR.  Findings: Levoscoliosis of the lumbar spine with degenerative changes most notable along the concavity.  Facet joint degenerative changes most prominent L5-S1.  No acute fracture detected.  IMPRESSION: Levoscoliosis of the lumbar spine with degenerative changes most notable along the concavity.  Facet joint degenerative changes most prominent L5-S1.  No acute fracture detected.   Original Report Authenticated By: Lacy Duverney, M.D.     Assessment: 52 y.o. female with low back pain   Sciatica  Plan:  Pain  management, muscle relaxants, follow up with ortho   Return as needed  MDM  I have reviewed this patient's vital signs, nurses notes, appropriate imaging and discussed findings with the patient and plan of care. She voices understanding.      Medication List    TAKE these medications       cyclobenzaprine 10 MG tablet  Commonly known as:  FLEXERIL  Take 1 tablet (10 mg total) by mouth 2 (two) times daily as needed for muscle spasms.     traMADol 50 MG tablet  Commonly known as:  ULTRAM  Take 1 tablet (50 mg total) by mouth every 6 (six) hours as needed for pain.               [facility] Orlene Och, [place] 01/09/13 [phone]

## 2013-01-09 NOTE — ED Notes (Signed)
Pain lt hip since fall, Hx of back and "nerve" pain

## 2013-01-11 NOTE — ED Provider Notes (Signed)
Medical screening examination/treatment/procedure(s) were performed by non-physician practitioner and as supervising physician I was immediately available for consultation/collaboration.  Joseline Mccampbell, MD 01/11/13 1521 

## 2013-04-27 ENCOUNTER — Emergency Department (HOSPITAL_COMMUNITY)
Admission: EM | Admit: 2013-04-27 | Discharge: 2013-04-27 | Disposition: A | Discharge status: Home / Self Care | Payer: Medicaid Other | Attending: Emergency Medicine | Admitting: Emergency Medicine

## 2013-04-27 ENCOUNTER — Encounter (HOSPITAL_COMMUNITY): Payer: Self-pay | Admitting: *Deleted

## 2013-04-27 DIAGNOSIS — R238 Other skin changes: Secondary | ICD-10-CM

## 2013-04-27 DIAGNOSIS — Z79899 Other long term (current) drug therapy: Secondary | ICD-10-CM | POA: Insufficient documentation

## 2013-04-27 DIAGNOSIS — N760 Acute vaginitis: Secondary | ICD-10-CM | POA: Insufficient documentation

## 2013-04-27 DIAGNOSIS — A499 Bacterial infection, unspecified: Secondary | ICD-10-CM | POA: Insufficient documentation

## 2013-04-27 DIAGNOSIS — B9689 Other specified bacterial agents as the cause of diseases classified elsewhere: Secondary | ICD-10-CM | POA: Insufficient documentation

## 2013-04-27 DIAGNOSIS — L988 Other specified disorders of the skin and subcutaneous tissue: Secondary | ICD-10-CM | POA: Insufficient documentation

## 2013-04-27 DIAGNOSIS — F3289 Other specified depressive episodes: Secondary | ICD-10-CM | POA: Insufficient documentation

## 2013-04-27 DIAGNOSIS — F329 Major depressive disorder, single episode, unspecified: Secondary | ICD-10-CM | POA: Insufficient documentation

## 2013-04-27 DIAGNOSIS — F17200 Nicotine dependence, unspecified, uncomplicated: Secondary | ICD-10-CM | POA: Insufficient documentation

## 2013-04-27 DIAGNOSIS — Z8659 Personal history of other mental and behavioral disorders: Secondary | ICD-10-CM | POA: Insufficient documentation

## 2013-04-27 HISTORY — DX: Dorsalgia, unspecified: M54.9

## 2013-04-27 LAB — WET PREP, GENITAL
Trich, Wet Prep: NONE SEEN
Yeast Wet Prep HPF POC: NONE SEEN

## 2013-04-27 MED ORDER — METRONIDAZOLE 500 MG PO TABS: 500.0000 mg | ORAL_TABLET | Freq: Two times a day (BID) | ORAL | Status: DC

## 2013-04-27 MED ORDER — BACITRACIN ZINC 500 UNIT/GM EX OINT
TOPICAL_OINTMENT | Freq: Two times a day (BID) | CUTANEOUS | Status: DC
Start: 2013-04-27 — End: 2013-09-24

## 2013-04-27 NOTE — ED Provider Notes (Signed)
CSN: [id number]     Arrival date & time 04/27/13  1100 History     First MD Initiated Contact with Patient 04/27/13 1135     Chief Complaint  Patient presents with  . Rash   (Consider location/radiation/quality/duration/timing/severity/associated sxs/prior Treatment) HPI Comments: Jill Poole is a 52 y.o. Female presenting with a skin lesion in her right groin which is itchy and has become irritated and now burning probably from scratching and irritating her skin.  She recently discovered her long term partner has had sex with another partner and is concerned for possible std.  She denies vaginal discharge, also has had no abdominal or pelvic pain, denies nausea, vomiting, fever or flu like symptoms.  She denies any new exposures to soaps, lotions, detergents or other skin products which might explain itching.  To her knowledge her partner was having no symptoms.     The history is provided by the patient.    Past Medical History  Diagnosis Date  . Post traumatic stress disorder (PTSD)   . Depression   . Back pain    Past Surgical History  Procedure Laterality Date  . Tibia fracture surgery    . Wrist surgery    . Tubal ligation     History reviewed. No pertinent family history. History  Substance Use Topics  . Smoking status: Current Every Day Smoker    Types: Cigarettes  . Smokeless tobacco: Not on file  . Alcohol Use: Yes   OB History   Grav Para Term Preterm Abortions TAB SAB Ect Mult Living                 Review of Systems  Constitutional: Negative for fever.  HENT: Negative for congestion, sore throat and neck pain.   Eyes: Negative.   Respiratory: Negative for chest tightness and shortness of breath.   Cardiovascular: Negative for chest pain.  Gastrointestinal: Negative for nausea and abdominal pain.  Genitourinary: Positive for genital sores. Negative for dysuria, vaginal bleeding, vaginal discharge and vaginal pain.  Musculoskeletal: Negative for joint  swelling and arthralgias.  Skin: Negative.  Negative for rash and wound.  Neurological: Negative for dizziness, weakness, light-headedness, numbness and headaches.  Psychiatric/Behavioral: Negative.     Allergies  Tramadol  Home Medications   Current Outpatient Rx  Name  Route  Sig  Dispense  Refill  . FLUoxetine (PROZAC) 20 MG capsule   Oral   Take 20 mg by mouth daily.         . traZODone (DESYREL) 100 MG tablet   Oral   Take 100 mg by mouth at bedtime.         . bacitracin ointment   Topical   Apply topically 2 (two) times daily.   15 g   0   . metroNIDAZOLE (FLAGYL) 500 MG tablet   Oral   Take 1 tablet (500 mg total) by mouth 2 (two) times daily.   14 tablet   0    BP 111/81  Pulse 92  Temp(Src) 98.5 F (36.9 C) (Oral)  Resp 18  Ht 5\' 4"  (1.626 m)  Wt 130 lb (58.968 kg)  BMI 22.3 kg/m2  SpO2 97%  LMP 06/17/2011 Physical Exam  Constitutional: She appears well-developed and well-nourished. No distress.  HENT:  Head: Normocephalic.  Eyes: Conjunctivae are normal.  Neck: Neck supple.  Cardiovascular: Normal rate.   Pulmonary/Chest: Effort normal. She has no wheezes.  Abdominal: Soft. Bowel sounds are normal. She exhibits no distension. There is  no tenderness.  Genitourinary: Vagina normal and uterus normal. Cervix exhibits no motion tenderness, no discharge and no friability. Right adnexum displays no mass, no tenderness and no fullness. Left adnexum displays no mass, no tenderness and no fullness. No erythema or tenderness around the vagina. No vaginal discharge found.  Small papule right groin without drainage or surrounding erythema.  Excoriations.  Musculoskeletal: Normal range of motion. She exhibits no edema.  Skin: Skin is warm and dry.    ED Course   Procedures (including critical care time)  Labs Reviewed  WET PREP, GENITAL - Abnormal; Notable for the following:    Clue Cells Wet Prep HPF POC FEW (*)    WBC, Wet Prep HPF POC FEW (*)     All other components within normal limits  GC/CHLAMYDIA PROBE AMP  RPR   No results found. 1. Bacterial vaginosis   2. Papule     MDM  Pt with small tender papule right groin with surrounding lichenificantion c/w scratching.  Unclear why patient is itching,  She has no signs of fungal infection,  Small clue cells, no findings suggesting std.  The papule is tender, appears to be an infected hair follicle.  Doubt chancre.  No ulceration.  Pt was prescribed flagyl, bacitracin ointment for folliculitis.  Encouraged desitin or vagisil cream for skin irritation.  Discussed that std cultures are pending.   Burgess Amor, PA-C 04/27/13 1736
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
## 2013-04-27 NOTE — ED Notes (Signed)
Pt left after lab draw without signing out. Discharge papers were given and scripts discussed. Pt verbalized understanding.

## 2013-04-28 LAB — GC/CHLAMYDIA PROBE AMP
CT Probe RNA: NEGATIVE
GC Probe RNA: NEGATIVE

## 2013-04-28 LAB — RPR: RPR Ser Ql: NONREACTIVE

## 2013-04-28 NOTE — ED Provider Notes (Signed)
Medical screening examination/treatment/procedure(s) were performed by non-physician practitioner and as supervising physician I was immediately available for consultation/collaboration.  Donnetta Hutching, MD 04/28/13 [phone]

## 2013-05-21 ENCOUNTER — Other Ambulatory Visit (HOSPITAL_COMMUNITY): Payer: Self-pay | Admitting: Physician Assistant

## 2013-05-21 DIAGNOSIS — Z139 Encounter for screening, unspecified: Secondary | ICD-10-CM

## 2013-06-05 ENCOUNTER — Ambulatory Visit (HOSPITAL_COMMUNITY)
Admission: RE | Admit: 2013-06-05 | Discharge: 2013-06-05 | Disposition: A | Discharge status: Home / Self Care | Payer: Medicaid Other | Source: Ambulatory Visit | Attending: Physician Assistant | Admitting: Physician Assistant

## 2013-06-05 DIAGNOSIS — Z139 Encounter for screening, unspecified: Secondary | ICD-10-CM

## 2013-09-24 ENCOUNTER — Emergency Department (HOSPITAL_COMMUNITY)
Admission: EM | Admit: 2013-09-24 | Discharge: 2013-09-25 | Disposition: A | Discharge status: Home / Self Care | Payer: MEDICAID | Attending: Emergency Medicine | Admitting: Emergency Medicine

## 2013-09-24 ENCOUNTER — Encounter (HOSPITAL_COMMUNITY): Payer: Self-pay | Admitting: Emergency Medicine

## 2013-09-24 DIAGNOSIS — F329 Major depressive disorder, single episode, unspecified: Secondary | ICD-10-CM

## 2013-09-24 DIAGNOSIS — F3289 Other specified depressive episodes: Secondary | ICD-10-CM | POA: Insufficient documentation

## 2013-09-24 DIAGNOSIS — R45851 Suicidal ideations: Secondary | ICD-10-CM | POA: Insufficient documentation

## 2013-09-24 DIAGNOSIS — F32A Depression, unspecified: Secondary | ICD-10-CM

## 2013-09-24 DIAGNOSIS — Z8739 Personal history of other diseases of the musculoskeletal system and connective tissue: Secondary | ICD-10-CM | POA: Insufficient documentation

## 2013-09-24 DIAGNOSIS — F17200 Nicotine dependence, unspecified, uncomplicated: Secondary | ICD-10-CM | POA: Insufficient documentation

## 2013-09-24 LAB — BASIC METABOLIC PANEL
BUN: 11 mg/dL (ref 6–23)
CO2: 23 mEq/L (ref 19–32)
Calcium: 9.2 mg/dL (ref 8.4–10.5)
Chloride: 95 mEq/L — ABNORMAL LOW (ref 96–112)
Creatinine, Ser: 0.7 mg/dL (ref 0.50–1.10)
GFR calc Af Amer: >90 mL/min (ref >90)
GFR calc non Af Amer: >90 mL/min (ref >90)
Glucose, Bld: 92 mg/dL (ref 70–99)
Potassium: 3.8 mEq/L (ref 3.7–5.3)
Sodium: 135 mEq/L — ABNORMAL LOW (ref 137–147)

## 2013-09-24 LAB — CBC WITH DIFFERENTIAL/PLATELET
Basophils Absolute: 0 10*3/uL (ref 0.0–0.1)
Basophils Relative: 0 % (ref 0–1)
Eosinophils Absolute: 0 10*3/uL (ref 0.0–0.7)
Eosinophils Relative: 0 % (ref 0–5)
HCT: 37.3 % (ref 36.0–46.0)
Hemoglobin: 13.2 g/dL (ref 12.0–15.0)
Lymphocytes Relative: 29 % (ref 12–46)
Lymphs Abs: 2.3 10*3/uL (ref 0.7–4.0)
MCH: 34.5 pg — ABNORMAL HIGH (ref 26.0–34.0)
MCHC: 35.4 g/dL (ref 30.0–36.0)
MCV: 97.4 fL (ref 78.0–100.0)
Monocytes Absolute: 0.4 10*3/uL (ref 0.1–1.0)
Monocytes Relative: 5 % (ref 3–12)
Neutro Abs: 5.2 10*3/uL (ref 1.7–7.7)
Neutrophils Relative %: 65 % (ref 43–77)
Platelets: 236 10*3/uL (ref 150–400)
RBC: 3.83 MIL/uL — ABNORMAL LOW (ref 3.87–5.11)
RDW: 11.8 % (ref 11.5–15.5)
WBC: 8 10*3/uL (ref 4.0–10.5)

## 2013-09-24 LAB — RAPID URINE DRUG SCREEN, HOSP PERFORMED
Amphetamines: NOT DETECTED
Barbiturates: NOT DETECTED
Benzodiazepines: NOT DETECTED
Cocaine: NOT DETECTED
Opiates: NOT DETECTED
Tetrahydrocannabinol: NOT DETECTED

## 2013-09-24 LAB — ETHANOL: Alcohol, Ethyl (B): 249 mg/dL — ABNORMAL HIGH (ref 0–11)

## 2013-09-24 MED ORDER — ZOLPIDEM TARTRATE 5 MG PO TABS: 5.0000 mg | ORAL_TABLET | Freq: Every evening | ORAL | Status: DC | PRN

## 2013-09-24 MED ORDER — ONDANSETRON HCL 4 MG PO TABS: 4.0000 mg | ORAL_TABLET | Freq: Three times a day (TID) | ORAL | Status: DC | PRN

## 2013-09-24 MED ORDER — ACETAMINOPHEN 325 MG PO TABS: 650.0000 mg | ORAL_TABLET | ORAL | Status: DC | PRN

## 2013-09-24 MED ORDER — IBUPROFEN 400 MG PO TABS: 600.0000 mg | ORAL_TABLET | Freq: Three times a day (TID) | ORAL | Status: DC | PRN

## 2013-09-24 MED ORDER — ALUM & MAG HYDROXIDE-SIMETH 200-200-20 MG/5ML PO SUSP: 30.0000 mL | ORAL | Status: DC | PRN

## 2013-09-24 MED ORDER — LORAZEPAM 1 MG PO TABS
1.0000 mg | ORAL_TABLET | Freq: Three times a day (TID) | ORAL | Status: DC | PRN
  Administered 2013-09-24: 1 mg via ORAL
  Filled 2013-09-24: qty 1

## 2013-09-24 MED ORDER — NICOTINE 21 MG/24HR TD PT24
21.0000 mg | MEDICATED_PATCH | Freq: Every day | TRANSDERMAL | Status: DC
  Administered 2013-09-24 – 2013-09-25 (×2): 21 mg via TRANSDERMAL
  Filled 2013-09-24 (×2): qty 1
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
## 2013-09-24 NOTE — ED Notes (Signed)
Pt states she is not suicidal. States she was just upset at the moment and feels better now. Spoke with EDP and still wants pt to have TTS since she initially stated she was suicidal and since police had to go to residence to bring pt in. Pt is aware.

## 2013-09-24 NOTE — ED Notes (Signed)
[facility] signed off by previous nurse.
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
## 2013-09-24 NOTE — ED Notes (Signed)
Pt resting w/ eyes closed, snoring respirations, rise & fall of chest noted. Bed in low position, side rails up x2. NAD noted, sitter remains in sight of pt.
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
## 2013-09-25 DIAGNOSIS — F3289 Other specified depressive episodes: Secondary | ICD-10-CM

## 2013-09-25 DIAGNOSIS — F329 Major depressive disorder, single episode, unspecified: Secondary | ICD-10-CM

## 2013-09-25 NOTE — ED Notes (Signed)
EDP signed rescending papers and paperwork faxed to magistrate.

## 2013-09-25 NOTE — Discharge Instructions (Signed)
°Emergency Department Resource Guide °1) Find a Doctor and Pay Out of Pocket °Although you won't have to find out who is covered by your insurance plan, it is a good idea to ask around and get recommendations. You will then need to call the office and see if the doctor you have chosen will accept you as a new patient and what types of options they offer for patients who are self-pay. Some doctors offer discounts or will set up payment plans for their patients who do not have insurance, but you will need to ask so you aren't surprised when you get to your appointment. ° °2) Contact Your Local Health Department °Not all health departments have doctors that can see patients for sick visits, but many do, so it is worth a call to see if yours does. If you don't know where your local health department is, you can check in your phone book. The CDC also has a tool to help you locate your state's health department, and many state websites also have listings of all of their local health departments. ° °3) Find a Walk-in Clinic °If your illness is not likely to be very severe or complicated, you may want to try a walk in clinic. These are popping up all over the country in pharmacies, drugstores, and shopping centers. They're usually staffed by nurse practitioners or physician assistants that have been trained to treat common illnesses and complaints. They're usually fairly quick and inexpensive. However, if you have serious medical issues or chronic medical problems, these are probably not your best option. ° °No Primary Care Doctor: °- Call [facility] at  832-8000 - they can help you locate a primary care doctor that  accepts your insurance, provides certain services, etc. °- Physician Referral Service- 1-800-533-3463 ° °Chronic Pain Problems: °Organization         Address  Phone   Notes  °[facility]  (336) [phone] Patients need to be referred by their primary care doctor.  ° °Medication  Assistance: °Organization         Address  Phone   Notes  °[place] [facility] [address] °[place], [place] [address] (336) 641-8030 --Must be a resident of [place] °-- Must have NO insurance coverage whatsoever (no Medicaid/ Medicare, etc.) °-- The pt. MUST have a primary care doctor that directs their care regularly and follows them in the community °  °[facility]  (866) 331-1348   °[employer]  (888) 892-1162   ° °Agencies that provide inexpensive medical care: °Organization         Address  Phone   Notes  °[facility]  (336) 832-8035   ° Internal Medicine    (336) 832-7272   °[facility] [address] °[place], [place] [address] (336) 832-4777   °[facility] [address], °[place] (336) 271-4999   °[facility]    (336) 373-0678   °[facility]    (336) 272-1050   °[facility] ° [address], [place] Phone:  (336) 832-4444, Fax:  (336) 832-4440 Hours of Operation:  9 am - 6 pm, M-F.  Also accepts Medicaid/Medicare and self-pay.  °[facility] ° [address], [place] Phone: (336) [phone], Fax: (336) [phone]. Hours of Operation:  8:30 am - 5:30 pm, M-F.  Also accepts Medicaid and self-pay.  °[facility] [place] [address], [place] Phone: (336) 878-6027   °[facility] [address], [place], [place] (336)723-1848, Ext. 123 Mondays & Thursdays: 7-9 AM.  First 15 patients are seen on a first come, first serve basis. °  ° °Medicaid-accepting [place] Providers: ° °Organization         Address  Phone   Notes  °[facility] [address], [place] (336) 641-2100 Also accepts self-pay patients.  °[facility] [address], [place] ° (336) 856-9996   °[facility] [address], [place]  (336) 288-8857   °[facility] [address] [place] [address], [place] (336) 299-7000   °Veita Bland [address], [place]  ° (336) 373-1557 Only accepts [place] Access Medicaid patients after they have their name applied to their card.  ° °Self-Pay (no insurance) in [place]: ° °Organization         Address  Phone   Notes  °Sickle Cell Patients, [facility] [address], [place] (336) 832-1970   °[facility] [address], [place] (336) 832-4400   ° [facility] ° [address], [place] (336) 992-4800   °[facility]/Dr. Osei-Bonsu ° [address] [place] [address], [place] or [address], [place] (336) [phone] Phone number for both [place] and [place] locations is the same.  °[facility] [address], [place] (336) 299-0000   °[facility] [address] [place] [address], [place] or [address] (336) 852-7530 °(336) 878-2260   °[facility] [address], [place] (336) 350-1642, phone; (336) 294-5005, fax Sees patients 1st and 3rd Saturday of every month.  Must not qualify for public or private insurance (i.e. Medicaid, Medicare, [place] Health Choice, Veterans' Benefits) • Household income should be no more than 200% of the poverty level •The clinic cannot treat you if you are pregnant or think you are pregnant • Sexually transmitted diseases are not treated at the clinic.  ° ° °Dental Care: °Organization         Address  Phone  Notes  °[place] [facility] [address], [place] (336) 641-6152 Accepts children up to age 21 who are enrolled in Medicaid or [place] Health Choice; pregnant women with a Medicaid card; and children who have applied for Medicaid or [place] Health Choice, but were declined, whose parents can pay a reduced fee at time of service.  °[place]  [facility] [place]  [address], [place] (336) 641-7733 Accepts children up to age 21 who are enrolled in Medicaid or [place] Health Choice; pregnant women with a Medicaid card; and children who have applied for Medicaid or [place] Health Choice, but were declined, whose parents can pay a reduced fee at time of service.  °[facility] ° [address],  (336) 641-4533 Patients are seen by appointment only. Walk-ins are not accepted. [facility] will see patients 18 years of age and older. °Monday - Tuesday (8am-5pm) °Most Wednesdays (8:30-5pm) °$30 per visit, cash only  °[facility] ° [address], [place] (336) 641-4533 Patients are seen by appointment only. Walk-ins are not accepted. [facility] will see patients 18 years of age and older. °One   Wednesday Evening (Monthly: Volunteer Based).  $30 per visit, cash only  °[facility]  (919) [phone] for adults; Children under age 4, call Graduate Pediatric Dentistry at ([phone]) [phone]. Children aged 4-14, please call ([phone]) [phone] to request a pediatric application. ° Dental services are provided in all areas of dental care including fillings, crowns and bridges, complete and partial dentures, implants, gum treatment, root canals, and extractions. Preventive care is also provided. Treatment is provided to both adults and children. °Patients are selected via a lottery and there is often a waiting list. °  °[facility] [address], °[place] ° (336) 763-8833 [email or web address] °  °[facility] [address], [place], [place] (336)723-1848, Ext. 123 Second and Fourth Thursday of each month, opens at 6:30 AM; Clinic ends at 9 AM.  Patients are seen on a first-come first-served basis, and a limited number are seen during each clinic.  ° °[facility] ° [address], [place], [place] (336) 723-7904    Eligibility Requirements °You must have lived in [place], [place], or [place] counties for at least the last three months. °  You cannot be eligible for state or federal sponsored healthcare insurance, including Veterans Administration, Medicaid, or Medicare. °  You generally cannot be eligible for healthcare insurance through your employer.  °  How to apply: °Eligibility screenings are held every Tuesday and Wednesday afternoon from 1:00 pm until 4:00 pm. You do not need an appointment for the interview!  °[facility] [address], [place], [place] 336-631-2330   °[facility]  336-342-8273   °[facility]  336-703-3100   °[facility]  336-570-6415   ° °Behavioral Health Resources in the Community: °Intensive Outpatient Programs °Organization         Address  Phone  Notes  °[facility] [address], [place], [place] 336-878-6098   °[facility] [address], [place], [place] 336-832-9800   °[facility] [address], [place], [place] ° 336-882-2125   °[facility] [address],  °[place], [place] 1-800-853-5163 or 336-641-4981   °Substance Abuse Resources °Organization         Address  Phone  Notes  °[facility]  336-882-2125   °[facility]  336-784-9470   °[facility]  336-285-9073   °[facility]  336-845-3988   °Residential & Outpatient Substance Abuse Program  1-800-659-3381   °Psychological Services °Organization         Address  Phone  Notes  °[facility]  336- 832-9600   °[facility]  336- 378-7881   °[facility] [address], [place] 1-800-853-5163 or 336-641-4981   ° °Mobile Crisis Teams °Organization         Address  Phone  Notes  °[facility], Mobile Crisis Care Unit  1-877-626-1772   °Assertive °[facility] ° [address]  [place], [place] 336-834-9664   °Sharon DeEsch [address] °[place] 336-554-5454   ° °Self-Help/Support Groups °Organization         Address  Phone             Notes  °[facility] [place] - variety of support groups  336- 373-1402 Call for more information  °Narcotics Anonymous (NA), [facility] [address], °[place]  2 meetings at this location  ° °  Residential Treatment Programs Organization         Address  Phone  Notes  [facility] [address],    [place]  [phone]   [facility]  [address] [place] [id number], [place], [place]   [facility] [place], [place] (949)753-6199 Admissions: 8am-3pm M-F  [facility] [place] [address]    [place], [place] 697-948-0165   [facility] [address] [place], [place], [place]   [facility] [address],  [place], [place]   [facility] - Intensive Outpatient [address], [place], [place]   [facility] [address]  [place], [place] 1-915-339-5873 or 973-855-6267   [facility] [address], [place], [place] Accepts Medicaid  [facility] [address]  [place] 1-(760)600-1853 Substance Abuse/Addiction Treatment   [facility]         Address  Phone  Notes  [facility]  762-731-2622   Domenic Schwab, PhD [address] Arlis Porta [place], [place]   9412498260 or [phone]   [facility], [place] (862)765-6203   [facility] [address], [place], [place] (217) 660-7332 Insurance/Medicaid/sponsorship through [facility] [address] [place]                                    [place], [place] [phone] [place] [address], [place] ([phone]) [phone]    Dr. Adele Schilder  ([phone])[phone]   [facility] Dept. 1) [address], [place] 2) Media 3)  [address], [place] [phone]  ([phone]) [phone]   [place] [phone] or [phone] (After Hours)       Follow the instructions given to you by the mental health team. Take your usual prescriptions as previously directed.  Call your regular medical doctor and the mental health resources given to you today to schedule a follow up appointment within the next week.  Return to the Emergency Department immediately sooner if worsening.
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
## 2013-09-25 NOTE — Progress Notes (Signed)
B.Opha Mcghee, MHT was requested by Marlou Porch, TTS to complete a hospital discharge appointment for patient who has been recommended to follow up with [facility] in [place]. Writer contacted [facility] spoke with Dejennet who advised to contact [employer] for authorization and appointment time. Writer spoke with clinician Bessie at [facility] and arranged for hospital discharge appointment. Patient has an appointment scheduled with [facility] on 09/28/13 between 7:45 am and 11:am appointment ID # [phone] provided by clinician. Writer advised attending nurse of this discharge plan.

## 2013-09-25 NOTE — ED Provider Notes (Signed)
Psych NP has evaluated pt: states pt denies SI/HI, does not have a plan, contracts for safety, agrees to re-start her psych meds; pt does not meet IVC nor inpt psych admit criteria at this time; requests EDP to rescind IVC and d/c pt to home with outpt f/u with [facility]. Will d/c pt.   Alfonzo Feller, DO 09/25/13 1441

## 2013-09-25 NOTE — ED Notes (Signed)
Pt set up to do 2nd telepsych.

## 2013-09-25 NOTE — ED Notes (Signed)
Per Bruce at [facility], pt to be d/c with a referral for [facility].  [facility] will call pt for appt.

## 2013-09-28 NOTE — Consult Note (Signed)
Case discussed, patient needs to attend AA, outpatient counseling. Patient does not need inpatient treatment and can be discharged with followup

## 2013-12-02 DIAGNOSIS — F17200 Nicotine dependence, unspecified, uncomplicated: Secondary | ICD-10-CM | POA: Insufficient documentation

## 2013-12-02 DIAGNOSIS — M5430 Sciatica, unspecified side: Secondary | ICD-10-CM | POA: Insufficient documentation

## 2014-03-18 DIAGNOSIS — R29898 Other symptoms and signs involving the musculoskeletal system: Secondary | ICD-10-CM | POA: Insufficient documentation

## 2014-03-18 DIAGNOSIS — M5416 Radiculopathy, lumbar region: Secondary | ICD-10-CM | POA: Insufficient documentation

## 2014-09-15 DIAGNOSIS — F17200 Nicotine dependence, unspecified, uncomplicated: Secondary | ICD-10-CM | POA: Insufficient documentation

## 2014-09-15 DIAGNOSIS — G5602 Carpal tunnel syndrome, left upper limb: Secondary | ICD-10-CM | POA: Insufficient documentation

## 2014-12-28 LAB — HM MAMMOGRAPHY: HM Mammogram: NORMAL (ref 0–4)

## 2014-12-29 LAB — HM MAMMOGRAPHY

## 2016-01-13 ENCOUNTER — Encounter: Payer: Self-pay | Admitting: Anesthesiology

## 2016-01-13 ENCOUNTER — Ambulatory Visit: Payer: Medicaid Other | Attending: Anesthesiology | Admitting: Anesthesiology

## 2016-01-13 VITALS — BP 145/84 | HR 58 | Temp 97.8°F | Resp 16 | Ht 64.0 in | Wt 134.0 lb

## 2016-01-13 DIAGNOSIS — M1288 Other specific arthropathies, not elsewhere classified, other specified site: Secondary | ICD-10-CM | POA: Insufficient documentation

## 2016-01-13 DIAGNOSIS — J449 Chronic obstructive pulmonary disease, unspecified: Secondary | ICD-10-CM | POA: Diagnosis not present

## 2016-01-13 DIAGNOSIS — M545 Low back pain, unspecified: Secondary | ICD-10-CM

## 2016-01-13 DIAGNOSIS — M5136 Other intervertebral disc degeneration, lumbar region: Secondary | ICD-10-CM | POA: Insufficient documentation

## 2016-01-13 DIAGNOSIS — J45909 Unspecified asthma, uncomplicated: Secondary | ICD-10-CM | POA: Insufficient documentation

## 2016-01-13 DIAGNOSIS — M797 Fibromyalgia: Secondary | ICD-10-CM

## 2016-01-13 DIAGNOSIS — G8929 Other chronic pain: Secondary | ICD-10-CM | POA: Diagnosis not present

## 2016-01-13 NOTE — Progress Notes (Signed)
Safety precautions to be maintained throughout the outpatient stay will include: orient to surroundings, keep bed in low position, maintain call bell within reach at all times, provide assistance with transfer out of bed and ambulation.  
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
## 2016-02-01 ENCOUNTER — Telehealth: Payer: Self-pay | Admitting: *Deleted

## 2016-02-01 NOTE — Telephone Encounter (Signed)
Returned pt call asking her to give me a call..Thanks

## 2016-02-03 ENCOUNTER — Encounter: Payer: Self-pay | Admitting: Anesthesiology

## 2016-02-03 ENCOUNTER — Ambulatory Visit: Payer: Medicaid Other | Attending: Anesthesiology | Admitting: Anesthesiology

## 2016-02-03 ENCOUNTER — Encounter (INDEPENDENT_AMBULATORY_CARE_PROVIDER_SITE_OTHER): Payer: Self-pay

## 2016-02-03 VITALS — BP 162/92 | HR 63 | Temp 98.1°F | Resp 16 | Ht 64.0 in | Wt 134.0 lb

## 2016-02-03 DIAGNOSIS — M1288 Other specific arthropathies, not elsewhere classified, other specified site: Secondary | ICD-10-CM | POA: Diagnosis not present

## 2016-02-03 DIAGNOSIS — M5136 Other intervertebral disc degeneration, lumbar region: Secondary | ICD-10-CM | POA: Diagnosis not present

## 2016-02-03 DIAGNOSIS — L720 Epidermal cyst: Secondary | ICD-10-CM | POA: Diagnosis not present

## 2016-02-03 DIAGNOSIS — M545 Low back pain, unspecified: Secondary | ICD-10-CM

## 2016-02-03 DIAGNOSIS — G8929 Other chronic pain: Secondary | ICD-10-CM | POA: Diagnosis not present

## 2016-02-03 NOTE — Progress Notes (Signed)
Safety precautions to be maintained throughout the outpatient stay will include: orient to surroundings, keep bed in low position, maintain call bell within reach at all times, provide assistance with transfer out of bed and ambulation.  

## 2016-02-06 NOTE — Progress Notes (Signed)
   Subjective:    Patient ID: Jill Poole, female    DOB: Sep 02, 1961, 55 y.o.   MRN: [id number]  HPI  This patient came in to the clinic to would not have the prior schedule caudal epidural  Steroid injection:because she has a lumbar facet cyst I assured her that the location of my needle for the caudal epidural steroid injection is quite a distance from her possible lumbar facet cyst She was determined that she was not going to risk having the injection I nevertheless respected helping and and she requested opioids I advised her that since she was not going to have the treatment that I was recommendin that she should return to the [facility] pain clinic for care She agreed with that recommendation and was discharged back to the [facility]>  Review of Systems  Constitutional: Negative.   HENT: Negative.   Eyes: Negative.   Respiratory: Negative.   Cardiovascular: Negative.   Gastrointestinal: Negative.   Endocrine: Negative.   Genitourinary: Negative.   Musculoskeletal: Positive for myalgias, back pain and arthralgias. Negative for joint swelling, gait problem, neck pain and neck stiffness.       HER LUMBAR MRI showed multiple level degenerative disc changes from the left L5-S1 facet joint which contained a large facet cyst which abuts the left S1 nerve root as it exits from the S1-S2 neural foramina  Skin: Negative.   Allergic/Immunologic: Negative.   Neurological: Negative.   Hematological: Negative.   Psychiatric/Behavioral: Negative.        Objective:   Physical Exam  Cardiovascular:  The patient appeared to be in no distrerss There were no new neurological or  Musculoskeletal findings Vital signs were relatively stable Blood pressure was 162/92 mmHg Pulse was 63 bpm Equal and regular Temperature was 98.56F Respirations was 16 breaths per min SPO2 was 100%  patient appeared to be in no distress at all  Nursing note and vitals reviewed.         Assessment & Plan:    Assessment 1 chronic low back pain 2  lumbar degenerative disc disease 3 lumbar facet arthropathy 4 left  Lumbar facet cyst    Plan of management Patient has refused therapy and we have mutually agreed to have her referred back to the [facility]  Pain clinic    Established patient      Level III    Lance Bosch M.D.

## 2016-02-29 DIAGNOSIS — M4105 Infantile idiopathic scoliosis, thoracolumbar region: Secondary | ICD-10-CM | POA: Insufficient documentation

## 2016-02-29 DIAGNOSIS — F419 Anxiety disorder, unspecified: Secondary | ICD-10-CM | POA: Insufficient documentation

## 2016-05-14 ENCOUNTER — Encounter: Payer: Self-pay | Admitting: Family Medicine

## 2016-05-14 ENCOUNTER — Ambulatory Visit (INDEPENDENT_AMBULATORY_CARE_PROVIDER_SITE_OTHER): Payer: Medicaid Other | Admitting: Family Medicine

## 2016-05-14 VITALS — BP 126/82 | HR 91 | Temp 99.0°F | Ht 62.0 in | Wt 131.0 lb

## 2016-05-14 DIAGNOSIS — F329 Major depressive disorder, single episode, unspecified: Secondary | ICD-10-CM | POA: Diagnosis not present

## 2016-05-14 DIAGNOSIS — Z23 Encounter for immunization: Secondary | ICD-10-CM | POA: Diagnosis not present

## 2016-05-14 DIAGNOSIS — J449 Chronic obstructive pulmonary disease, unspecified: Secondary | ICD-10-CM | POA: Diagnosis not present

## 2016-05-14 DIAGNOSIS — M5136 Other intervertebral disc degeneration, lumbar region: Secondary | ICD-10-CM | POA: Insufficient documentation

## 2016-05-14 DIAGNOSIS — F32A Depression, unspecified: Secondary | ICD-10-CM | POA: Insufficient documentation

## 2016-05-14 MED ORDER — ALBUTEROL SULFATE HFA 108 (90 BASE) MCG/ACT IN AERS: 2.0000 | INHALATION_SPRAY | Freq: Four times a day (QID) | RESPIRATORY_TRACT | 1 refills | Status: DC | PRN

## 2016-05-14 MED ORDER — FLUTICASONE-SALMETEROL 250-50 MCG/DOSE IN AEPB: 1.0000 | INHALATION_SPRAY | Freq: Two times a day (BID) | RESPIRATORY_TRACT | 12 refills | Status: DC

## 2016-05-14 NOTE — Assessment & Plan Note (Signed)
Had epidural steroid injection done at [facility] 05/01/16. States that it did not help. Would like to go back on her percocet. Discussed with patient that I do not prescribe any controlled substances on the first visit, and that for long term medications we would recommend that she see pain management. She cannot do PT due to insurance issues. She is currently seeing [facility] pain management and is being worked up by them. Called over to their office today and she has an appointment scheduled for 05/31/16 at 8:30AM. Advised her to keep that appointment, continue naproxen and baclofen. Advised OTC lidoderm patches. Call with any concerns.

## 2016-05-14 NOTE — Assessment & Plan Note (Signed)
Stable. Does not need refill today. Continue current regimen. Continue to monitor.
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
## 2016-05-14 NOTE — Patient Instructions (Addendum)
Influenza (Flu) Vaccine (Inactivated or Recombinant):  1. Why get vaccinated? Influenza ("flu") is a contagious disease that spreads around the [place] every year, usually between October and May. Flu is caused by influenza viruses, and is spread mainly by coughing, sneezing, and close contact. Anyone can get flu. Flu strikes suddenly and can last several days. Symptoms vary by age, but can include:  fever/chills  sore throat  muscle aches  fatigue  cough  headache  runny or stuffy nose Flu can also lead to pneumonia and blood infections, and cause diarrhea and seizures in children. If you have a medical condition, such as heart or lung disease, flu can make it worse. Flu is more dangerous for some people. Infants and young children, people 65 years of age and older, pregnant women, and people with certain health conditions or a weakened immune system are at greatest risk. Each year thousands of people in the [place] die from flu, and many more are hospitalized. Flu vaccine can:  keep you from getting flu,  make flu less severe if you do get it, and  keep you from spreading flu to your family and other people. 2. Inactivated and recombinant flu vaccines A dose of flu vaccine is recommended every flu season. Children 6 months through 8 years of age may need two doses during the same flu season. Everyone else needs only one dose each flu season. Some inactivated flu vaccines contain a very small amount of a mercury-based preservative called thimerosal. Studies have not shown thimerosal in vaccines to be harmful, but flu vaccines that do not contain thimerosal are available. There is no live flu virus in flu shots. They cannot cause the flu. There are many flu viruses, and they are always changing. Each year a new flu vaccine is made to protect against three or four viruses that are likely to cause disease in the upcoming flu season. But even when the vaccine doesn't exactly  match these viruses, it may still provide some protection. Flu vaccine cannot prevent:  flu that is caused by a virus not covered by the vaccine, or  illnesses that look like flu but are not. It takes about 2 weeks for protection to develop after vaccination, and protection lasts through the flu season. 3. Some people should not get this vaccine Tell the person who is giving you the vaccine:  If you have any severe, life-threatening allergies. If you ever had a life-threatening allergic reaction after a dose of flu vaccine, or have a severe allergy to any part of this vaccine, you may be advised not to get vaccinated. Most, but not all, types of flu vaccine contain a small amount of egg protein.  If you ever had Guillain-Barre Syndrome (also called GBS). Some people with a history of GBS should not get this vaccine. This should be discussed with your doctor.  If you are not feeling well. It is usually okay to get flu vaccine when you have a mild illness, but you might be asked to come back when you feel better. 4. Risks of a vaccine reaction With any medicine, including vaccines, there is a chance of reactions. These are usually mild and go away on their own, but serious reactions are also possible. Most people who get a flu shot do not have any problems with it. Minor problems following a flu shot include:  soreness, redness, or swelling where the shot was given  hoarseness  sore, red or itchy eyes  cough    fever  aches  headache  itching  fatigue If these problems occur, they usually begin soon after the shot and last 1 or 2 days. More serious problems following a flu shot can include the following:  There may be a small increased risk of Guillain-Barre Syndrome (GBS) after inactivated flu vaccine. This risk has been estimated at 1 or 2 additional cases per million people vaccinated. This is much lower than the risk of severe complications from flu, which can be prevented by  flu vaccine.  Young children who get the flu shot along with pneumococcal vaccine (PCV13) and/or DTaP vaccine at the same time might be slightly more likely to have a seizure caused by fever. Ask your doctor for more information. Tell your doctor if a child who is getting flu vaccine has ever had a seizure. Problems that could happen after any injected vaccine:  People sometimes faint after a medical procedure, including vaccination. Sitting or lying down for about 15 minutes can help prevent fainting, and injuries caused by a fall. Tell your doctor if you feel dizzy, or have vision changes or ringing in the ears.  Some people get severe pain in the shoulder and have difficulty moving the arm where a shot was given. This happens very rarely.  Any medication can cause a severe allergic reaction. Such reactions from a vaccine are very rare, estimated at about 1 in a million doses, and would happen within a few minutes to a few hours after the vaccination. As with any medicine, there is a very remote chance of a vaccine causing a serious injury or death. The safety of vaccines is always being monitored. For more information, visit: www.cdc.gov/vaccinesafety/ 5. What if there is a serious reaction? What should I look for?  Look for anything that concerns you, such as signs of a severe allergic reaction, very high fever, or unusual behavior. Signs of a severe allergic reaction can include hives, swelling of the face and throat, difficulty breathing, a fast heartbeat, dizziness, and weakness. These would start a few minutes to a few hours after the vaccination. What should I do?  If you think it is a severe allergic reaction or other emergency that can't wait, call 9-1-1 and get the person to the nearest hospital. Otherwise, call your doctor.  Reactions should be reported to the Vaccine Adverse Event Reporting System (VAERS). Your doctor should file this report, or you can do it yourself through the  VAERS web site at www.vaers.hhs.gov, or by calling 1-800-822-7967. VAERS does not give medical advice. 6. The National Vaccine Injury Compensation Program The National Vaccine Injury Compensation Program (VICP) is a federal program that was created to compensate people who may have been injured by certain vaccines. Persons who believe they may have been injured by a vaccine can learn about the program and about filing a claim by calling 1-800-338-2382 or visiting the VICP website at www.hrsa.gov/vaccinecompensation. There is a time limit to file a claim for compensation. 7. How can I learn more?  Ask your healthcare provider. He or she can give you the vaccine package insert or suggest other sources of information.  Call your local or state health department.  Contact the Centers for Disease Control and Prevention (CDC):  Call 1-800-232-4636 (1-800-CDC-INFO) or  Visit CDC's website at www.cdc.gov/flu Vaccine Information Statement Inactivated Influenza Vaccine (04/09/2014)   This information is not intended to replace advice given to you by your health care provider. Make sure you discuss any questions you have with   your health care provider.   Document Released: 06/14/2006 Document Revised: 09/10/2014 Document Reviewed: 04/12/2014 Elsevier Interactive Patient Education 2016 [place]. Lidocaine dermal patch- Look for the 4% OTC patches, they are cheaper than the Rx What is this medicine? LIDOCAINE (LYE doe kane) causes loss of feeling in the skin and surrounding area. The medicine helps treat nerve pain from herpes (shingles) infection. This medicine may be used for other purposes; ask your health care provider or pharmacist if you have questions. What should I tell my health care provider before I take this medicine? They need to know if you have any of these conditions: -liver disease -skin rash or inflamed and irritated skin -an unusual or allergic reaction to parabens,  lidocaine, other medicines, foods, dyes, or preservatives -pregnant or trying to get pregnant -breast-feeding How should I use this medicine? Apply the patches over the most painful areas of skin. Make sure the skin does not have any open sores or rashes. If irritation or burning feelings occur, remove the patch or patches, and do not apply the patch again until the irritation resolves. Do not touch your eyes after touching a patch. The medicine can irritate your eyes. If medicine gets in your eye, flush the eye with water, and protect the eye until sensation returns. You may apply up to 3 patches to different skin areas at one time. Leave the patches on for only 12 hours. After a patch has been on your skin for up to 12 hours, remove the patch and throw it away. Do not apply another patch or patches for at least 12 hours. If you use more than 3 patches at a time or leave a patch on your skin for more than 12 hours, you may have serious side effects. Patches may be cut into smaller sizes with scissors before removing the adhesive liner. You may wear clothing over the patches. Do not use a heating pad or electric blanket over the patch. The patch may not stick to the skin if it gets wet. Avoid contact with water when wearing the patch. Use this medicine as directed. Talk to your pediatrician regarding the use of this medicine in children. Special care may be needed. Overdosage: If you think you have taken too much of this medicine contact a poison control center or emergency room at once. NOTE: This medicine is only for you. Do not share this medicine with others. What if I miss a dose? Apply the patches as needed for pain. What may interact with this medicine? -dofetilide -heart medicines -MAOIs like Carbex, Eldepryl, Marplan, Nardil, and Parnate -other ointments or creams that may contain anesthetic medicine This list may not describe all possible interactions. Give your health care provider a list  of all the medicines, herbs, non-prescription drugs, or dietary supplements you use. Also tell them if you smoke, drink alcohol, or use illegal drugs. Some items may interact with your medicine. What should I watch for while using this medicine? Be careful to avoid injury while the area is numb from the medicine, and you are not aware of pain. If you are going to have a MRI procedure, let your MRI technician know about the use of these patches. Some drug patches contain an aluminized backing that can become heated when exposed to MRI and may cause burns. You may need to temporarily remove the patch during the MRI procedure. What side effects may I notice from receiving this medicine? Side effects that you should report to your doctor  or health care professional as soon as possible: -chest pain -dizziness -skin rash -swelling of face, lips, or tongue -wheezing or difficulty breathing Side effects that usually do not require medical attention (report to your doctor or health care professional if they continue or are bothersome): -skin irritation such as redness or swelling -unusual sensations such as numbness, tingling, or burning feelings This list may not describe all possible side effects. Call your doctor for medical advice about side effects. You may report side effects to FDA at 1-800-FDA-1088. Where should I keep my medicine? Keep out of the reach of children or pets. Accidental chewing or swallowing of a new or used patch may cause serious and life-threatening effects. A used patch still contains enough medicine to cause serious side effects and even death to children or pets. Store at room temperature between 15 and 30 degrees C (59 and 86 degrees F). Do not store the patches out of their wrappers. Throw away any unused medicine after the expiration date. NOTE: This sheet is a summary. It may not cover all possible information. If you have questions about this medicine, talk to your doctor,  pharmacist, or health care provider.    2016, Elsevier/Gold Standard. (2013-04-14 15:01:52)

## 2016-05-14 NOTE — Assessment & Plan Note (Signed)
Stable. Will continue current regimen. Continue to monitor. Refills given today.

## 2016-05-29 ENCOUNTER — Encounter (INDEPENDENT_AMBULATORY_CARE_PROVIDER_SITE_OTHER): Payer: Self-pay

## 2016-06-14 ENCOUNTER — Encounter: Payer: Self-pay | Admitting: Family Medicine

## 2016-06-14 ENCOUNTER — Ambulatory Visit (INDEPENDENT_AMBULATORY_CARE_PROVIDER_SITE_OTHER): Payer: Medicaid Other | Admitting: Family Medicine

## 2016-06-14 VITALS — BP 144/94 | HR 71 | Temp 98.6°F | Ht 62.1 in | Wt 131.6 lb

## 2016-06-14 DIAGNOSIS — Z23 Encounter for immunization: Secondary | ICD-10-CM

## 2016-06-14 DIAGNOSIS — Z114 Encounter for screening for human immunodeficiency virus [HIV]: Secondary | ICD-10-CM

## 2016-06-14 DIAGNOSIS — Z0000 Encounter for general adult medical examination without abnormal findings: Secondary | ICD-10-CM

## 2016-06-14 DIAGNOSIS — Z1159 Encounter for screening for other viral diseases: Secondary | ICD-10-CM

## 2016-06-14 DIAGNOSIS — M5136 Other intervertebral disc degeneration, lumbar region: Secondary | ICD-10-CM

## 2016-06-14 DIAGNOSIS — Z124 Encounter for screening for malignant neoplasm of cervix: Secondary | ICD-10-CM

## 2016-06-14 DIAGNOSIS — Z1211 Encounter for screening for malignant neoplasm of colon: Secondary | ICD-10-CM

## 2016-06-14 NOTE — Progress Notes (Signed)
BP (!) 144/94 (BP Location: Left Arm, Patient Position: Sitting, Cuff Size: Normal)   Pulse 71   Temp 98.6 F (37 C)   Ht 5' 2.1" (1.577 m)   Wt 131 lb 9.3 oz (59.7 kg)   LMP 06/17/2011   SpO2 97%   BMI 23.99 kg/m    Subjective:    Patient ID: Jill Poole, female    DOB: [birth year], 55 y.o.   MRN: [id number]  HPI: Jill Poole is a 55 y.o. female presenting on 06/14/2016 for comprehensive medical examination. Current medical complaints include: back is still bothering her, not seeing the pain clinic any more.   Menopausal Symptoms: no  Depression Screen done today and results listed below:  Depression screen [facility] 2/9 05/14/2016 02/03/2016 01/13/2016  Decreased Interest 0 0 0  Down, Depressed, Hopeless 0 0 0  PHQ - 2 Score 0 0 0    Past Medical History:  Past Medical History:  Diagnosis Date  . Asthma   . Back pain   . Concussion   . COPD (chronic obstructive pulmonary disease) ([place])   . DDD (degenerative disc disease), lumbar   . Depression   . Post traumatic stress disorder (PTSD)     Surgical History:  Past Surgical History:  Procedure Laterality Date  . TIBIA FRACTURE SURGERY    . TUBAL LIGATION    . WRIST SURGERY      Medications:  Current Outpatient Prescriptions on File Prior to Visit  Medication Sig  . albuterol (PROVENTIL HFA;VENTOLIN HFA) 108 (90 Base) MCG/ACT inhaler Inhale 2 puffs into the lungs every 6 (six) hours as needed for wheezing or shortness of breath.  Marland Kitchen aspirin 81 MG tablet Take 81 mg by mouth daily.  Marland Kitchen FLUoxetine (PROZAC) 20 MG capsule Take 20 mg by mouth daily.  . Fluticasone-Salmeterol (ADVAIR) 250-50 MCG/DOSE AEPB Inhale 1 puff into the lungs 2 (two) times daily.  . Multiple Vitamins-Minerals (CENTRUM ADULTS PO) Take by mouth every other day.  . naproxen (NAPROSYN) 500 MG tablet Take 500 mg by mouth 2 (two) times daily with a meal.  . omeprazole (PRILOSEC) 20 MG capsule Take 20 mg by mouth daily.  . traZODone (DESYREL) 150 MG tablet  Take 100 mg by mouth at bedtime.    No current facility-administered medications on file prior to visit.     Allergies:  Allergies  Allergen Reactions  . Adenosine   . Tramadol Nausea And Vomiting  . Codeine Nausea And Vomiting and Other (See Comments)    Hot flashes, throwing up sick.  . Hydrocodone-Acetaminophen Nausea Only    Social History:  Social History   Social History  . Marital status: Single    Spouse name: N/A  . Number of children: N/A  . Years of education: N/A   Occupational History  . Not on file.   Social History Main Topics  . Smoking status: Current Every Day Smoker    Packs/day: 0.50    Types: Cigarettes  . Smokeless tobacco: Never Used  . Alcohol use Yes     Comment: once every two weeks  . Drug use: No  . Sexual activity: No   Other Topics Concern  . Not on file   Social History Narrative  . No narrative on file   History  Smoking Status  . Current Every Day Smoker  . Packs/day: 0.50  . Types: Cigarettes  Smokeless Tobacco  . Never Used   History  Alcohol Use  . Yes    Comment:  once every two weeks    Family History:  Family History  Problem Relation Age of Onset  . Arthritis Mother   . Hypertension Mother   . Hypertension Sister   . Diabetes Sister   . Cancer Sister   . Heart disease Brother   . Asthma Brother   . Other Son     Lipoma  . Cancer Maternal Grandmother     Stomach    Past medical history, surgical history, medications, allergies, family history and social history reviewed with patient today and changes made to appropriate areas of the chart.   Review of Systems  Constitutional: Negative.   HENT: Positive for hearing loss and tinnitus. Negative for congestion, ear discharge, ear pain, nosebleeds and sore throat.   Eyes: Negative.   Respiratory: Positive for cough, shortness of breath (with moving around, inhalers help sometimes) and wheezing. Negative for hemoptysis, sputum production and stridor.     Cardiovascular: Negative.   Gastrointestinal: Negative.   Genitourinary: Negative.   Musculoskeletal: Positive for back pain and myalgias. Negative for falls, joint pain and neck pain.  Skin: Negative.   Neurological: Positive for dizziness. Negative for tingling, tremors, sensory change, speech change, focal weakness, seizures, loss of consciousness and headaches.  Endo/Heme/Allergies: Negative for environmental allergies and polydipsia. Bruises/bleeds easily.  Psychiatric/Behavioral: Negative.     All other ROS negative except what is listed above and in the HPI.      Objective:    BP (!) 144/94 (BP Location: Left Arm, Patient Position: Sitting, Cuff Size: Normal)   Pulse 71   Temp 98.6 F (37 C)   Ht 5' 2.1" (1.577 m)   Wt 131 lb 9.3 oz (59.7 kg)   LMP 06/17/2011   SpO2 97%   BMI 23.99 kg/m   Wt Readings from Last 3 Encounters:  06/14/16 131 lb 9.3 oz (59.7 kg)  05/14/16 131 lb (59.4 kg)  02/03/16 134 lb (60.8 kg)    Physical Exam  Constitutional: She is oriented to person, place, and time. She appears well-developed and well-nourished. No distress.  HENT:  Head: Normocephalic and atraumatic.  Right Ear: Hearing, tympanic membrane, external ear and ear canal normal.  Left Ear: Hearing, tympanic membrane, external ear and ear canal normal.  Nose: Nose normal.  Mouth/Throat: Uvula is midline, oropharynx is clear and moist and mucous membranes are normal. No oropharyngeal exudate.  Eyes: Conjunctivae, EOM and lids are normal. Pupils are equal, round, and reactive to light. Right eye exhibits no discharge. Left eye exhibits no discharge. No scleral icterus.  Neck: Normal range of motion. Neck supple. No JVD present. No tracheal deviation present. No thyromegaly present.  Cardiovascular: Normal rate, regular rhythm, normal heart sounds and intact distal pulses.  Exam reveals no gallop and no friction rub.   No murmur heard. Pulmonary/Chest: Effort normal. No stridor. No  respiratory distress. She has no decreased breath sounds. She has wheezes in the right upper field and the left upper field. She has no rhonchi. She has no rales. She exhibits no tenderness.  Abdominal: Soft. Bowel sounds are normal. She exhibits no distension and no mass. There is no tenderness. There is no rebound and no guarding. Hernia confirmed negative in the right inguinal area and confirmed negative in the left inguinal area.  Genitourinary: Vagina normal and uterus normal. No breast swelling, tenderness, discharge or bleeding. No labial fusion. There is no rash, tenderness, lesion or injury on the right labia. There is no rash, tenderness, lesion or injury  on the left labia. Uterus is not deviated, not enlarged, not fixed and not tender. Cervix exhibits no motion tenderness, no discharge and no friability. Right adnexum displays no mass, no tenderness and no fullness. Left adnexum displays no mass, no tenderness and no fullness. No erythema, tenderness or bleeding in the vagina. No foreign body in the vagina. No signs of injury around the vagina. No vaginal discharge found.    Musculoskeletal: Normal range of motion. She exhibits no edema, tenderness or deformity.  Lymphadenopathy:    She has no cervical adenopathy.       Right: No inguinal adenopathy present.       Left: No inguinal adenopathy present.  Neurological: She is alert and oriented to person, place, and time. She has normal reflexes. She displays normal reflexes. No cranial nerve deficit. She exhibits normal muscle tone. Coordination normal.  Skin: Skin is warm, dry and intact. No rash noted. She is not diaphoretic. No erythema. No pallor.  Psychiatric: She has a normal mood and affect. Her speech is normal and behavior is normal. Judgment and thought content normal. Cognition and memory are normal.  Nursing note and vitals reviewed.   Results for orders placed or performed in visit on 05/14/16  HM MAMMOGRAPHY  Result Value Ref  Range   HM Mammogram Self Reported Normal 0-4 Bi-Rad, Self Reported Normal  HM MAMMOGRAPHY  Result Value Ref Range   HM Mammogram 0-4 Bi-Rad 0-4 Bi-Rad, Self Reported Normal      Assessment & Plan:   Problem List Items Addressed This Visit      Musculoskeletal and Integument   DDD (degenerative disc disease), lumbar    Not happy with current pain management at [facility]. Will get her into new pain management. Call with any concerns.       Relevant Medications   tiZANidine (ZANAFLEX) 4 MG tablet   Other Relevant Orders   Ambulatory referral to Pain Clinic    Other Visit Diagnoses    Routine general medical examination at a health care facility    -  Primary   updating vaccines. Screening labs checked today. Mammogram up to date. Pap done today. Will do FOBT for colon cancer screening. Continue diet and exercise.    Relevant Orders   CBC with Differential/Platelet   Comprehensive metabolic panel   Lipid Panel Piccol[place], Waived   TSH   UA/M w/rflx Culture, Routine   Immunization due       Pneumovax given today.   Relevant Orders   Pneumococcal polysaccharide vaccine 23-valent greater than or equal to 2yo subcutaneous/IM (Completed)   Need for hepatitis C screening test       Labs checked today.    Relevant Orders   Hepatitis C Antibody   Screening for HIV without presence of risk factors       Labs checked today.    Relevant Orders   HIV antibody   Screening for colon cancer       Will return FOBT cards   Relevant Orders   Fecal occult blood, imunochemical   Screening for cervical cancer       Pap done today.   Relevant Orders   IGP, Aptima HPV, rfx 16/18,45       Follow up plan: No Follow-up on file.   LABORATORY TESTING:  - Pap smear: pap done  IMMUNIZATIONS:   - Tdap: Tetanus vaccination status reviewed: Wants to get it next time - Influenza: Up to date - Pneumovax: Up to date -  Prevnar: Not applicable - Zostavax vaccine: Will check on  coverage  SCREENING: -Mammogram: Up to date  - Colonoscopy: Will do stool cards - Bone Density: Not applicable   PATIENT COUNSELING:   Advised to take 1 mg of folate supplement per day if capable of pregnancy.   Sexuality: Discussed sexually transmitted diseases, partner selection, use of condoms, avoidance of unintended pregnancy  and contraceptive alternatives.   Advised to avoid cigarette smoking.  I discussed with the patient that most people either abstain from alcohol or drink within safe limits (<=14/week and <=4 drinks/occasion for males, <=7/weeks and <= 3 drinks/occasion for females) and that the risk for alcohol disorders and other health effects rises proportionally with the number of drinks per week and how often a drinker exceeds daily limits.  Discussed cessation/primary prevention of drug use and availability of treatment for abuse.   Diet: Encouraged to adjust caloric intake to maintain  or achieve ideal body weight, to reduce intake of dietary saturated fat and total fat, to limit sodium intake by avoiding high sodium foods and not adding table salt, and to maintain adequate dietary potassium and calcium preferably from fresh fruits, vegetables, and low-fat dairy products.    stressed the importance of regular exercise  Injury prevention: Discussed safety belts, safety helmets, smoke detector, smoking near bedding or upholstery.   Dental health: Discussed importance of regular tooth brushing, flossing, and dental visits.    NEXT PREVENTATIVE PHYSICAL DUE IN 1 YEAR. No Follow-up on file.

## 2016-06-14 NOTE — Patient Instructions (Addendum)
Pneumococcal Vaccine, Polyvalent solution for injection What is this medicine? PNEUMOCOCCAL VACCINE, POLYVALENT (NEU mo KOK al vak SEEN, pol ee VEY luhnt) is a vaccine to prevent pneumococcus bacteria infection. These bacteria are a major cause of ear infections, Strep throat infections, and serious pneumonia, meningitis, or blood infections worldwide. These vaccines help the body to produce antibodies (protective substances) that help your body defend against these bacteria. This vaccine is recommended for people 53 years of age and older with health problems. It is also recommended for all adults over 52 years old. This vaccine will not treat an infection. This medicine may be used for other purposes; ask your health care provider or pharmacist if you have questions. What should I tell my health care provider before I take this medicine? They need to know if you have any of these conditions: -bleeding problems -bone marrow or organ transplant -cancer, Hodgkin's disease -fever -infection -immune system problems -low platelet count in the blood -seizures -an unusual or allergic reaction to pneumococcal vaccine, diphtheria toxoid, other vaccines, latex, other medicines, foods, dyes, or preservatives -pregnant or trying to get pregnant -breast-feeding How should I use this medicine? This vaccine is for injection into a muscle or under the skin. It is given by a health care professional. A copy of Vaccine Information Statements will be given before each vaccination. Read this sheet carefully each time. The sheet may change frequently. Talk to your pediatrician regarding the use of this medicine in children. While this drug may be prescribed for children as young as 55 years of age for selected conditions, precautions do apply. Overdosage: If you think you have taken too much of this medicine contact a poison control center or emergency room at once. NOTE: This medicine is only for you. Do not share  this medicine with others. What if I miss a dose? It is important not to miss your dose. Call your doctor or health care professional if you are unable to keep an appointment. What may interact with this medicine? -medicines for cancer chemotherapy -medicines that suppress your immune function -medicines that treat or prevent blood clots like warfarin, enoxaparin, and dalteparin -steroid medicines like prednisone or cortisone This list may not describe all possible interactions. Give your health care provider a list of all the medicines, herbs, non-prescription drugs, or dietary supplements you use. Also tell them if you smoke, drink alcohol, or use illegal drugs. Some items may interact with your medicine. What should I watch for while using this medicine? Mild fever and pain should go away in 3 days or less. Report any unusual symptoms to your doctor or health care professional. What side effects may I notice from receiving this medicine? Side effects that you should report to your doctor or health care professional as soon as possible: -allergic reactions like skin rash, itching or hives, swelling of the face, lips, or tongue -breathing problems -confused -fever over 102 degrees F -pain, tingling, numbness in the hands or feet -seizures -unusual bleeding or bruising -unusual muscle weakness Side effects that usually do not require medical attention (report to your doctor or health care professional if they continue or are bothersome): -aches and pains -diarrhea -fever of 102 degrees F or less -headache -irritable -loss of appetite -pain, tender at site where injected -trouble sleeping This list may not describe all possible side effects. Call your doctor for medical advice about side effects. You may report side effects to FDA at 1-800-FDA-1088. Where should I keep my medicine? This  does not apply. This vaccine is given in a clinic, pharmacy, doctor's office, or other health care  setting and will not be stored at home. NOTE: This sheet is a summary. It may not cover all possible information. If you have questions about this medicine, talk to your doctor, pharmacist, or health care provider.    2016, Elsevier/[employer] Standard. (2008-03-26 14:32:37) Health Maintenance, Female Adopting a healthy lifestyle and getting preventive care can go a long way to promote health and wellness. Talk with your health care provider about what schedule of regular examinations is right for you. This is a good chance for you to check in with your provider about disease prevention and staying healthy. In between checkups, there are plenty of things you can do on your own. Experts have done a lot of research about which lifestyle changes and preventive measures are most likely to keep you healthy. Ask your health care provider for more information. WEIGHT AND DIET  Eat a healthy diet  Be sure to include plenty of vegetables, fruits, low-fat dairy products, and lean protein.  Do not eat a lot of foods high in solid fats, added sugars, or salt.  Get regular exercise. This is one of the most important things you can do for your health.  Most adults should exercise for at least 150 minutes each week. The exercise should increase your heart rate and make you sweat (moderate-intensity exercise).  Most adults should also do strengthening exercises at least twice a week. This is in addition to the moderate-intensity exercise.  Maintain a healthy weight  Body mass index (BMI) is a measurement that can be used to identify possible weight problems. It estimates body fat based on height and weight. Your health care provider can help determine your BMI and help you achieve or maintain a healthy weight.  For females 53 years of age and older:   A BMI below 18.5 is considered underweight.  A BMI of 18.5 to 24.9 is normal.  A BMI of 25 to 29.9 is considered overweight.  A BMI of 30 and above is  considered obese.  Watch levels of cholesterol and blood lipids  You should start having your blood tested for lipids and cholesterol at 53 years of age, then have this test every 5 years.  You may need to have your cholesterol levels checked more often if:  Your lipid or cholesterol levels are high.  You are older than 53 years of age.  You are at high risk for heart disease.  CANCER SCREENING   Lung Cancer  Lung cancer screening is recommended for adults 68-14 years old who are at high risk for lung cancer because of a history of smoking.  A yearly low-dose CT scan of the lungs is recommended for people who:  Currently smoke.  Have quit within the past 15 years.  Have at least a 30-pack-year history of smoking. A pack year is smoking an average of one pack of cigarettes a day for 1 year.  Yearly screening should continue until it has been 15 years since you quit.  Yearly screening should stop if you develop a health problem that would prevent you from having lung cancer treatment.  Breast Cancer  Practice breast self-awareness. This means understanding how your breasts normally appear and feel.  It also means doing regular breast self-exams. Let your health care provider know about any changes, no matter how small.  If you are in your 20s or 30s, you should have  a clinical breast exam (CBE) by a health care provider every 1-3 years as part of a regular health exam.  If you are 62 or older, have a CBE every year. Also consider having a breast X-ray (mammogram) every year.  If you have a family history of breast cancer, talk to your health care provider about genetic screening.  If you are at high risk for breast cancer, talk to your health care provider about having an MRI and a mammogram every year.  Breast cancer gene (BRCA) assessment is recommended for women who have family members with BRCA-related cancers. BRCA-related cancers  include:  Breast.  Ovarian.  Tubal.  Peritoneal cancers.  Results of the assessment will determine the need for genetic counseling and BRCA1 and BRCA2 testing. Cervical Cancer Your health care provider may recommend that you be screened regularly for cancer of the pelvic organs (ovaries, uterus, and vagina). This screening involves a pelvic examination, including checking for microscopic changes to the surface of your cervix (Pap test). You may be encouraged to have this screening done every 3 years, beginning at age 27.  For women ages 85-65, health care providers may recommend pelvic exams and Pap testing every 3 years, or they may recommend the Pap and pelvic exam, combined with testing for human papilloma virus (HPV), every 5 years. Some types of HPV increase your risk of cervical cancer. Testing for HPV may also be done on women of any age with unclear Pap test results.  Other health care providers may not recommend any screening for nonpregnant women who are considered low risk for pelvic cancer and who do not have symptoms. Ask your health care provider if a screening pelvic exam is right for you.  If you have had past treatment for cervical cancer or a condition that could lead to cancer, you need Pap tests and screening for cancer for at least 20 years after your treatment. If Pap tests have been discontinued, your risk factors (such as having a new sexual partner) need to be reassessed to determine if screening should resume. Some women have medical problems that increase the chance of getting cervical cancer. In these cases, your health care provider may recommend more frequent screening and Pap tests. Colorectal Cancer  This type of cancer can be detected and often prevented.  Routine colorectal cancer screening usually begins at 52 years of age and continues through 53 years of age.  Your health care provider may recommend screening at an earlier age if you have risk factors for  colon cancer.  Your health care provider may also recommend using home test kits to check for hidden blood in the stool.  A small camera at the end of a tube can be used to examine your colon directly (sigmoidoscopy or colonoscopy). This is done to check for the earliest forms of colorectal cancer.  Routine screening usually begins at age 39.  Direct examination of the colon should be repeated every 5-10 years through 53 years of age. However, you may need to be screened more often if early forms of precancerous polyps or small growths are found. Skin Cancer  Check your skin from head to toe regularly.  Tell your health care provider about any new moles or changes in moles, especially if there is a change in a mole's shape or color.  Also tell your health care provider if you have a mole that is larger than the size of a pencil eraser.  Always use sunscreen. Apply sunscreen  liberally and repeatedly throughout the day.  Protect yourself by wearing long sleeves, pants, a wide-brimmed hat, and sunglasses whenever you are outside. HEART DISEASE, DIABETES, AND HIGH BLOOD PRESSURE   High blood pressure causes heart disease and increases the risk of stroke. High blood pressure is more likely to develop in:  People who have blood pressure in the high end of the normal range (130-139/85-89 mm Hg).  People who are overweight or obese.  People who are African American.  If you are 40-36 years of age, have your blood pressure checked every 3-5 years. If you are 99 years of age or older, have your blood pressure checked every year. You should have your blood pressure measured twice--once when you are at a hospital or clinic, and once when you are not at a hospital or clinic. Record the average of the two measurements. To check your blood pressure when you are not at a hospital or clinic, you can use:  An automated blood pressure machine at a pharmacy.  A home blood pressure monitor.  If you  are between 28 years and 66 years old, ask your health care provider if you should take aspirin to prevent strokes.  Have regular diabetes screenings. This involves taking a blood sample to check your fasting blood sugar level.  If you are at a normal weight and have a low risk for diabetes, have this test once every three years after 53 years of age.  If you are overweight and have a high risk for diabetes, consider being tested at a younger age or more often. PREVENTING INFECTION  Hepatitis B  If you have a higher risk for hepatitis B, you should be screened for this virus. You are considered at high risk for hepatitis B if:  You were born in a country where hepatitis B is common. Ask your health care provider which countries are considered high risk.  Your parents were born in a high-risk country, and you have not been immunized against hepatitis B (hepatitis B vaccine).  You have HIV or AIDS.  You use needles to inject street drugs.  You live with someone who has hepatitis B.  You have had sex with someone who has hepatitis B.  You get hemodialysis treatment.  You take certain medicines for conditions, including cancer, organ transplantation, and autoimmune conditions. Hepatitis C  Blood testing is recommended for:  Everyone born from [birth year] through 1963.  Anyone with known risk factors for hepatitis C. Sexually transmitted infections (STIs)  You should be screened for sexually transmitted infections (STIs) including gonorrhea and chlamydia if:  You are sexually active and are younger than 53 years of age.  You are older than 53 years of age and your health care provider tells you that you are at risk for this type of infection.  Your sexual activity has changed since you were last screened and you are at an increased risk for chlamydia or gonorrhea. Ask your health care provider if you are at risk.  If you do not have HIV, but are at risk, it may be recommended that you  take a prescription medicine daily to prevent HIV infection. This is called pre-exposure prophylaxis (PrEP). You are considered at risk if:  You are sexually active and do not regularly use condoms or know the HIV status of your partner(s).  You take drugs by injection.  You are sexually active with a partner who has HIV. Talk with your health care provider about whether you  are at high risk of being infected with HIV. If you choose to begin PrEP, you should first be tested for HIV. You should then be tested every 3 months for as long as you are taking PrEP.  PREGNANCY   If you are premenopausal and you may become pregnant, ask your health care provider about preconception counseling.  If you may become pregnant, take 400 to 800 micrograms (mcg) of folic acid every day.  If you want to prevent pregnancy, talk to your health care provider about birth control (contraception). OSTEOPOROSIS AND MENOPAUSE   Osteoporosis is a disease in which the bones lose minerals and strength with aging. This can result in serious bone fractures. Your risk for osteoporosis can be identified using a bone density scan.  If you are 51 years of age or older, or if you are at risk for osteoporosis and fractures, ask your health care provider if you should be screened.  Ask your health care provider whether you should take a calcium or vitamin D supplement to lower your risk for osteoporosis.  Menopause may have certain physical symptoms and risks.  Hormone replacement therapy may reduce some of these symptoms and risks. Talk to your health care provider about whether hormone replacement therapy is right for you.  HOME CARE INSTRUCTIONS   Schedule regular health, dental, and eye exams.  Stay current with your immunizations.   Do not use any tobacco products including cigarettes, chewing tobacco, or electronic cigarettes.  If you are pregnant, do not drink alcohol.  If you are breastfeeding, limit how  much and how often you drink alcohol.  Limit alcohol intake to no more than 1 drink per day for nonpregnant women. One drink equals 12 ounces of beer, 5 ounces of wine, or 1 ounces of hard liquor.  Do not use street drugs.  Do not share needles.  Ask your health care provider for help if you need support or information about quitting drugs.  Tell your health care provider if you often feel depressed.  Tell your health care provider if you have ever been abused or do not feel safe at home.   This information is not intended to replace advice given to you by your health care provider. Make sure you discuss any questions you have with your health care provider.   Document Released: 03/05/2011 Document Revised: 09/10/2014 Document Reviewed: 07/22/2013 Elsevier Interactive Patient Education [employer]. Menopause is a normal process in which your reproductive ability comes to an end. This process happens gradually over a span of months to years, usually between the ages of 58 and 4. Menopause is complete when you have missed 12 consecutive menstrual periods. It is important to talk with your health care provider about some of the most common conditions that affect postmenopausal women, such as heart disease, cancer, and bone loss (osteoporosis). Adopting a healthy lifestyle and getting preventive care can help to promote your health and wellness. Those actions can also lower your chances of developing some of these common conditions. WHAT SHOULD I KNOW ABOUT MENOPAUSE? During menopause, you may experience a number of symptoms, such as:  Moderate-to-severe hot flashes.  Night sweats.  Decrease in sex drive.  Mood swings.  Headaches.  Tiredness.  Irritability.  Memory problems.  Insomnia. Choosing to treat or not to treat menopausal changes is an individual decision that you make with your health care provider. WHAT SHOULD I KNOW ABOUT HORMONE REPLACEMENT THERAPY AND  SUPPLEMENTS? Hormone therapy products are effective  for treating symptoms that are associated with menopause, such as hot flashes and night sweats. Hormone replacement carries certain risks, especially as you become older. If you are thinking about using estrogen or estrogen with progestin treatments, discuss the benefits and risks with your health care provider. WHAT SHOULD I KNOW ABOUT HEART DISEASE AND STROKE? Heart disease, heart attack, and stroke become more likely as you age. This may be due, in part, to the hormonal changes that your body experiences during menopause. These can affect how your body processes dietary fats, triglycerides, and cholesterol. Heart attack and stroke are both medical emergencies. There are many things that you can do to help prevent heart disease and stroke:  Have your blood pressure checked at least every 1-2 years. High blood pressure causes heart disease and increases the risk of stroke.  If you are 35-62 years old, ask your health care provider if you should take aspirin to prevent a heart attack or a stroke.  Do not use any tobacco products, including cigarettes, chewing tobacco, or electronic cigarettes. If you need help quitting, ask your health care provider.  It is important to eat a healthy diet and maintain a healthy weight.  Be sure to include plenty of vegetables, fruits, low-fat dairy products, and lean protein.  Avoid eating foods that are high in solid fats, added sugars, or salt (sodium).  Get regular exercise. This is one of the most important things that you can do for your health.  Try to exercise for at least 150 minutes each week. The type of exercise that you do should increase your heart rate and make you sweat. This is known as moderate-intensity exercise.  Try to do strengthening exercises at least twice each week. Do these in addition to the moderate-intensity exercise.  Know your numbers.Ask your health care provider to check  your cholesterol and your blood glucose. Continue to have your blood tested as directed by your health care provider. WHAT SHOULD I KNOW ABOUT CANCER SCREENING? There are several types of cancer. Take the following steps to reduce your risk and to catch any cancer development as early as possible. Breast Cancer  Practice breast self-awareness.  This means understanding how your breasts normally appear and feel.  It also means doing regular breast self-exams. Let your health care provider know about any changes, no matter how small.  If you are 68 or older, have a clinician do a breast exam (clinical breast exam or CBE) every year. Depending on your age, family history, and medical history, it may be recommended that you also have a yearly breast X-ray (mammogram).  If you have a family history of breast cancer, talk with your health care provider about genetic screening.  If you are at high risk for breast cancer, talk with your health care provider about having an MRI and a mammogram every year.  Breast cancer (BRCA) gene test is recommended for women who have family members with BRCA-related cancers. Results of the assessment will determine the need for genetic counseling and BRCA1 and for BRCA2 testing. BRCA-related cancers include these types:  Breast. This occurs in males or females.  Ovarian.  Tubal. This may also be called fallopian tube cancer.  Cancer of the abdominal or pelvic lining (peritoneal cancer).  Prostate.  Pancreatic. Cervical, Uterine, and Ovarian Cancer Your health care provider may recommend that you be screened regularly for cancer of the pelvic organs. These include your ovaries, uterus, and vagina. This screening involves a pelvic exam,  which includes checking for microscopic changes to the surface of your cervix (Pap test).  For women ages 21-65, health care providers may recommend a pelvic exam and a Pap test every three years. For women ages 38-65, they  may recommend the Pap test and pelvic exam, combined with testing for human papilloma virus (HPV), every five years. Some types of HPV increase your risk of cervical cancer. Testing for HPV may also be done on women of any age who have unclear Pap test results.  Other health care providers may not recommend any screening for nonpregnant women who are considered low risk for pelvic cancer and have no symptoms. Ask your health care provider if a screening pelvic exam is right for you.  If you have had past treatment for cervical cancer or a condition that could lead to cancer, you need Pap tests and screening for cancer for at least 20 years after your treatment. If Pap tests have been discontinued for you, your risk factors (such as having a new sexual partner) need to be reassessed to determine if you should start having screenings again. Some women have medical problems that increase the chance of getting cervical cancer. In these cases, your health care provider may recommend that you have screening and Pap tests more often.  If you have a family history of uterine cancer or ovarian cancer, talk with your health care provider about genetic screening.  If you have vaginal bleeding after reaching menopause, tell your health care provider.  There are currently no reliable tests available to screen for ovarian cancer. Lung Cancer Lung cancer screening is recommended for adults 78-55 years old who are at high risk for lung cancer because of a history of smoking. A yearly low-dose CT scan of the lungs is recommended if you:  Currently smoke.  Have a history of at least 30 pack-years of smoking and you currently smoke or have quit within the past 15 years. A pack-year is smoking an average of one pack of cigarettes per day for one year. Yearly screening should:  Continue until it has been 15 years since you quit.  Stop if you develop a health problem that would prevent you from having lung cancer  treatment. Colorectal Cancer  This type of cancer can be detected and can often be prevented.  Routine colorectal cancer screening usually begins at age 18 and continues through age 70.  If you have risk factors for colon cancer, your health care provider may recommend that you be screened at an earlier age.  If you have a family history of colorectal cancer, talk with your health care provider about genetic screening.  Your health care provider may also recommend using home test kits to check for hidden blood in your stool.  A small camera at the end of a tube can be used to examine your colon directly (sigmoidoscopy or colonoscopy). This is done to check for the earliest forms of colorectal cancer.  Direct examination of the colon should be repeated every 5-10 years until age 35. However, if early forms of precancerous polyps or small growths are found or if you have a family history or genetic risk for colorectal cancer, you may need to be screened more often. Skin Cancer  Check your skin from head to toe regularly.  Monitor any moles. Be sure to tell your health care provider:  About any new moles or changes in moles, especially if there is a change in a mole's shape or color.  If you have a mole that is larger than the size of a pencil eraser.  If any of your family members has a history of skin cancer, especially at a young age, talk with your health care provider about genetic screening.  Always use sunscreen. Apply sunscreen liberally and repeatedly throughout the day.  Whenever you are outside, protect yourself by wearing long sleeves, pants, a wide-brimmed hat, and sunglasses. WHAT SHOULD I KNOW ABOUT OSTEOPOROSIS? Osteoporosis is a condition in which bone destruction happens more quickly than new bone creation. After menopause, you may be at an increased risk for osteoporosis. To help prevent osteoporosis or the bone fractures that can happen because of osteoporosis, the  following is recommended:  If you are 19-69 years old, get at least 1,000 mg of calcium and at least 600 mg of vitamin D per day.  If you are older than age 37 but younger than age 66, get at least 1,200 mg of calcium and at least 600 mg of vitamin D per day.  If you are older than age 31, get at least 1,200 mg of calcium and at least 800 mg of vitamin D per day. Smoking and excessive alcohol intake increase the risk of osteoporosis. Eat foods that are rich in calcium and vitamin D, and do weight-bearing exercises several times each week as directed by your health care provider. WHAT SHOULD I KNOW ABOUT HOW MENOPAUSE AFFECTS [place]? Depression may occur at any age, but it is more common as you become older. Common symptoms of depression include:  Low or sad mood.  Changes in sleep patterns.  Changes in appetite or eating patterns.  Feeling an overall lack of motivation or enjoyment of activities that you previously enjoyed.  Frequent crying spells. Talk with your health care provider if you think that you are experiencing depression. WHAT SHOULD I KNOW ABOUT IMMUNIZATIONS? It is important that you get and maintain your immunizations. These include:  Tetanus, diphtheria, and pertussis (Tdap) booster vaccine.  Influenza every year before the flu season begins.  Pneumonia vaccine.  Shingles vaccine. Your health care provider may also recommend other immunizations.   This information is not intended to replace advice given to you by your health care provider. Make sure you discuss any questions you have with your health care provider.   Document Released: 10/12/2005 Document Revised: 09/10/2014 Document Reviewed: 04/22/2014 Elsevier Interactive Patient Education [employer].

## 2016-06-14 NOTE — Assessment & Plan Note (Signed)
Not happy with current pain management at [facility]. Will get her into new pain management. Call with any concerns.

## 2016-06-15 ENCOUNTER — Encounter: Payer: Self-pay | Admitting: Family Medicine

## 2016-06-15 LAB — COMPREHENSIVE METABOLIC PANEL
ALT: 14 IU/L (ref 0–32)
AST: 19 IU/L (ref 0–40)
Albumin/Globulin Ratio: 1.7 (ref 1.2–2.2)
Albumin: 4.6 g/dL (ref 3.5–5.5)
Alkaline Phosphatase: 95 IU/L (ref 39–117)
BUN/Creatinine Ratio: 13 (ref 9–23)
BUN: 9 mg/dL (ref 6–24)
Bilirubin Total: 0.2 mg/dL (ref 0.0–1.2)
CO2: 22 mmol/L (ref 18–29)
Calcium: 9.2 mg/dL (ref 8.7–10.2)
Chloride: 96 mmol/L (ref 96–106)
Creatinine, Ser: 0.7 mg/dL (ref 0.57–1.00)
GFR calc Af Amer: 114 mL/min/{1.73_m2} (ref >59)
GFR calc non Af Amer: 99 mL/min/{1.73_m2} (ref >59)
Globulin, Total: 2.7 g/dL (ref 1.5–4.5)
Glucose: 95 mg/dL (ref 65–99)
Potassium: 3.8 mmol/L (ref 3.5–5.2)
Sodium: 135 mmol/L (ref 134–144)
Total Protein: 7.3 g/dL (ref 6.0–8.5)

## 2016-06-15 LAB — CBC WITH DIFFERENTIAL/PLATELET
Basophils Absolute: 0 10*3/uL (ref 0.0–0.2)
Basos: 1 %
EOS (ABSOLUTE): 0.1 10*3/uL (ref 0.0–0.4)
Eos: 1 %
Hematocrit: 38.1 % (ref 34.0–46.6)
Hemoglobin: 13.3 g/dL (ref 11.1–15.9)
Immature Grans (Abs): 0 10*3/uL (ref 0.0–0.1)
Immature Granulocytes: 0 %
Lymphocytes Absolute: 1.8 10*3/uL (ref 0.7–3.1)
Lymphs: 34 %
MCH: 35.7 pg — ABNORMAL HIGH (ref 26.6–33.0)
MCHC: 34.9 g/dL (ref 31.5–35.7)
MCV: 102 fL — ABNORMAL HIGH (ref 79–97)
Monocytes Absolute: 0.5 10*3/uL (ref 0.1–0.9)
Monocytes: 9 %
Neutrophils Absolute: 3 10*3/uL (ref 1.4–7.0)
Neutrophils: 55 %
Platelets: 210 10*3/uL (ref 150–379)
RBC: 3.73 x10E6/uL — ABNORMAL LOW (ref 3.77–5.28)
RDW: 13.7 % (ref 12.3–15.4)
WBC: 5.4 10*3/uL (ref 3.4–10.8)

## 2016-06-15 LAB — TSH: TSH: 1.12 u[IU]/mL (ref 0.450–4.500)

## 2016-06-15 LAB — HEPATITIS C ANTIBODY: Hep C Virus Ab: <0.1 s/co ratio (ref 0.0–0.9)

## 2016-06-15 LAB — HIV ANTIBODY (ROUTINE TESTING W REFLEX): HIV Screen 4th Generation wRfx: NONREACTIVE

## 2016-06-16 LAB — MICROSCOPIC EXAMINATION

## 2016-06-16 LAB — LIPID PANEL W/O CHOL/HDL RATIO
Cholesterol, Total: 225 mg/dL — ABNORMAL HIGH (ref 100–199)
HDL: 93 mg/dL (ref >39)
LDL Calculated: 86 mg/dL (ref 0–99)
Triglycerides: 232 mg/dL — ABNORMAL HIGH (ref 0–149)
VLDL Cholesterol Cal: 46 mg/dL — ABNORMAL HIGH (ref 5–40)

## 2016-06-16 LAB — UA/M W/RFLX CULTURE, ROUTINE
Bilirubin, UA: NEGATIVE
Glucose, UA: NEGATIVE
Ketones, UA: NEGATIVE
Nitrite, UA: NEGATIVE
Protein, UA: NEGATIVE
Specific Gravity, UA: 1.01 (ref 1.005–1.030)
Urobilinogen, Ur: 0.2 mg/dL (ref 0.2–1.0)
pH, UA: 6 (ref 5.0–7.5)

## 2016-06-16 LAB — SPECIMEN STATUS REPORT

## 2016-06-16 LAB — URINE CULTURE, REFLEX: Organism ID, Bacteria: NO GROWTH

## 2016-06-22 ENCOUNTER — Encounter: Payer: Self-pay | Admitting: Family Medicine

## 2016-06-22 DIAGNOSIS — B977 Papillomavirus as the cause of diseases classified elsewhere: Secondary | ICD-10-CM | POA: Insufficient documentation

## 2016-06-22 LAB — IGP, APTIMA HPV, RFX 16/18,45
HPV Aptima: POSITIVE — AB
HPV Genotype 16: NEGATIVE
HPV Genotype 18,45: NEGATIVE
PAP Smear Comment: 0

## 2016-09-14 ENCOUNTER — Ambulatory Visit (INDEPENDENT_AMBULATORY_CARE_PROVIDER_SITE_OTHER): Payer: Medicaid Other | Admitting: Family Medicine

## 2016-09-14 ENCOUNTER — Encounter: Payer: Self-pay | Admitting: Family Medicine

## 2016-09-14 ENCOUNTER — Telehealth: Payer: Self-pay

## 2016-09-14 VITALS — BP 147/87 | HR 69 | Wt 144.6 lb

## 2016-09-14 DIAGNOSIS — F331 Major depressive disorder, recurrent, moderate: Secondary | ICD-10-CM | POA: Diagnosis not present

## 2016-09-14 DIAGNOSIS — L719 Rosacea, unspecified: Secondary | ICD-10-CM | POA: Diagnosis not present

## 2016-09-14 DIAGNOSIS — G4700 Insomnia, unspecified: Secondary | ICD-10-CM | POA: Diagnosis not present

## 2016-09-14 DIAGNOSIS — R0683 Snoring: Secondary | ICD-10-CM

## 2016-09-14 DIAGNOSIS — M5136 Other intervertebral disc degeneration, lumbar region: Secondary | ICD-10-CM | POA: Diagnosis not present

## 2016-09-14 MED ORDER — TRAZODONE HCL 100 MG PO TABS: 100.0000 mg | ORAL_TABLET | Freq: Every day | ORAL | 6 refills | Status: DC

## 2016-09-14 MED ORDER — NICOTINE POLACRILEX 2 MG MT GUM: 2.0000 mg | CHEWING_GUM | OROMUCOSAL | 1 refills | Status: DC | PRN

## 2016-09-14 MED ORDER — METRONIDAZOLE 500 MG PO TABS: 500.0000 mg | ORAL_TABLET | Freq: Two times a day (BID) | ORAL | 0 refills | Status: DC

## 2016-09-14 NOTE — Assessment & Plan Note (Signed)
Stable. Will continue her current regimen. Concern for OSA- will obtain sleep study. Call with any concerns.

## 2016-09-14 NOTE — Assessment & Plan Note (Signed)
Stable. Continue current regimen. Call with any concerns.  

## 2016-09-14 NOTE — Assessment & Plan Note (Signed)
Will check on her pain center referral. Call with any concerns.

## 2016-09-14 NOTE — Telephone Encounter (Signed)
Called and spoke with patient.   Patient had a referral back in November. She wanted to see Dr. Patricia Nettle new office. However, the number that is listed does not work. I called and told patient of this and she explained she would stick with [facility] appointment 07/25/2016.  Appoinment not kept in [place]. Patient had an appointment today and asked Dr. Wynetta Emery about her referral.   Reminded patient of what we discussed previously and she said "yeah, I just didn't like the doctor at [facility]. She was demanding or wanted me to do what she wanted me to do."  Offered patient referral to [facility]. Patient declined and said she'd been there once before.  Offered patient referral to [facility] in [place]. Patient denied again.  Patient said "they're going to tell me the same thing, I'll just wait till it's really bad".  Canceling referral. Dr. Wynetta Emery notified.

## 2016-09-14 NOTE — Addendum Note (Signed)
Addended by: Valerie Roys on: 09/14/2016 02:10 PM   Modules accepted: Orders

## 2016-09-14 NOTE — Assessment & Plan Note (Signed)
Will treat with metronidazole. May have to use metrogel. Call with any concerns. Continue to monitor.

## 2016-09-14 NOTE — Patient Instructions (Addendum)
Rosacea Introduction Rosacea is a long-term (chronic) condition that affects the skin of the face, including the cheeks, nose, brow, and chin. This condition can also affect the eyes. Rosacea causes blood vessels near the surface of the skin to get bigger (be enlarged), and that makes the skin red. There is no cure for this condition, but treatment can help to control your symptoms. Follow these instructions at home: Skin Care  Take care of your skin as told by your doctor. Your doctor may tell you do these things:  Wash your skin gently two or more times each day.  Use mild soap.  Use a sunscreen or sunblock with SPF 30 or greater.  Use gentle cosmetics that are meant for sensitive skin.  Shave with an electric shaver instead of a blade. Lifestyle  Try to keep track of what foods trigger this condition. Avoid any triggers. These may include:  Spicy foods.  Seafood.  Cheese.  Hot liquids.  Nuts.  Chocolate.  Iodized salt.  Do not drink alcohol.  Avoid extremely cold or hot temperatures.  Try to reduce your stress. If you need help to do this, talk with your doctor.  When you exercise, do these things to stay cool:  Limit your sun exposure.  Use a fan.  Exercise for a shorter time, and exercise more often. General instructions  Keep all follow-up visits as told by your doctor. This is important.  Take over-the-counter and prescription medicines only as told by your doctor.  If your eyelids are affected, press warm compresses to them. Do this as told by your doctor.  If you were prescribed an antibiotic medicine, apply or take it as told by your doctor. Do not stop using the antibiotic even if your condition improves. Contact a doctor if:  Your symptoms get worse.  Your symptoms do not improve after two months of treatment.  You have new symptoms.  You have any changes in vision or you have problems with your eyes, such as redness or itching.  You feel  very sad (depressed).  You lose your appetite.  You have trouble concentrating. This information is not intended to replace advice given to you by your health care provider. Make sure you discuss any questions you have with your health care provider. Document Released: 11/12/2011 Document Revised: 01/26/2016 Document Reviewed: 10/27/2014  2017 Elsevier

## 2016-09-14 NOTE — Progress Notes (Signed)
BP (!) 147/87 (BP Location: Left Arm, Patient Position: Sitting, Cuff Size: Normal)   Pulse 69   Wt 144 lb 9.6 oz (65.6 kg)   LMP 06/17/2011   SpO2 99%   BMI 26.36 kg/m    Subjective:    Patient ID: Jill Poole, female    DOB: [birth year], 56 y.o.   MRN: [id number]  HPI: Jill Poole is a 56 y.o. female  Chief Complaint  Patient presents with  . Insomnia  . Back Pain  . Gastroesophageal Reflux  . Depression  . Rash    Patient states that her face has been breaking out on her face, she went to urgent care, they gave her steriods, that did not help her    INSOMNIA Duration: chronic Satisfied with sleep quality: yes Difficulty falling asleep: no Difficulty staying asleep: yes Waking a few hours after sleep onset: no Early morning awakenings: yes Daytime hypersomnolence: no Wakes feeling refreshed: yes Good sleep hygiene: yes Apnea: yes Snoring: yes Depressed/anxious mood: yes Recent stress: yes Restless legs/nocturnal leg cramps: no Chronic pain/arthritis: yes History of sleep study: no Treatments attempted: trazadone   RASH Duration:  3 weeks  Location: face  Itching: yes Burning: yes Redness: yes Oozing: no Scaling: yes Blisters: no Painful: no Fevers: no Change in detergents/soaps/personal care products: no- did move into a new place Recent illness: no Recent travel:no History of same: no Context: better Alleviating factors: nothing Treatments attempted:allergy medicine, hydrocortisone cream, benadryl and lotion/moisturizer Shortness of breath: no  Throat/tongue swelling: no Myalgias/arthralgias: no    GERD under good control. Has not heard from the pain center yet. Otherwise feeling well with no concerns. Her mood has been stable.   Relevant past medical, surgical, family and social history reviewed and updated as indicated. Interim medical history since our last visit reviewed. Allergies and medications reviewed and updated.  Review of  Systems  Constitutional: Negative.   Respiratory: Negative.   Cardiovascular: Negative.   Musculoskeletal: Positive for back pain, gait problem and myalgias. Negative for arthralgias, joint swelling, neck pain and neck stiffness.  Neurological: Negative for dizziness, tremors, seizures, syncope, facial asymmetry, speech difficulty, weakness, light-headedness, numbness and headaches.  Psychiatric/Behavioral: Positive for sleep disturbance. Negative for agitation, behavioral problems, confusion, decreased concentration, dysphoric mood, hallucinations, self-injury and suicidal ideas. The patient is not nervous/anxious and is not hyperactive.     Per HPI unless specifically indicated above     Objective:    BP (!) 147/87 (BP Location: Left Arm, Patient Position: Sitting, Cuff Size: Normal)   Pulse 69   Wt 144 lb 9.6 oz (65.6 kg)   LMP 06/17/2011   SpO2 99%   BMI 26.36 kg/m   Wt Readings from Last 3 Encounters:  09/14/16 144 lb 9.6 oz (65.6 kg)  06/14/16 131 lb 9.3 oz (59.7 kg)  05/14/16 131 lb (59.4 kg)    Physical Exam  Constitutional: She is oriented to person, place, and time. She appears well-developed and well-nourished. No distress.  HENT:  Head: Normocephalic and atraumatic.  Right Ear: Hearing normal.  Left Ear: Hearing normal.  Nose: Nose normal.  Eyes: Conjunctivae and lids are normal. Right eye exhibits no discharge. Left eye exhibits no discharge. No scleral icterus.  Cardiovascular: Normal rate, regular rhythm, normal heart sounds and intact distal pulses.  Exam reveals no gallop and no friction rub.   No murmur heard. Pulmonary/Chest: Effort normal and breath sounds normal. No respiratory distress. She has no wheezes. She has no rales. She exhibits  no tenderness.  Musculoskeletal: Normal range of motion.  Neurological: She is alert and oriented to person, place, and time.  Skin: Skin is warm, dry and intact. No rash noted. She is not diaphoretic. No erythema. No  pallor.  Psychiatric: She has a normal mood and affect. Her speech is normal and behavior is normal. Judgment and thought content normal. Cognition and memory are normal.  Nursing note and vitals reviewed.   Results for orders placed or performed in visit on 06/14/16  Microscopic Examination  Result Value Ref Range   WBC, UA 6-10 (A) 0 - 5 /hpf   RBC, UA 3-10 (A) 0 - 2 /hpf   Epithelial Cells (non renal) 0-10 0 - 10 /hpf   Bacteria, UA Moderate (A) None seen/Few  CBC with Differential/Platelet  Result Value Ref Range   WBC 5.4 3.4 - 10.8 x10E3/uL   RBC 3.73 (L) 3.77 - 5.28 x10E6/uL   Hemoglobin 13.3 11.1 - 15.9 g/dL   Hematocrit 38.1 34.0 - 46.6 %   MCV 102 (H) 79 - 97 fL   MCH 35.7 (H) 26.6 - 33.0 pg   MCHC 34.9 31.5 - 35.7 g/dL   RDW 13.7 12.3 - 15.4 %   Platelets 210 150 - 379 x10E3/uL   Neutrophils 55 Not Estab. %   Lymphs 34 Not Estab. %   Monocytes 9 Not Estab. %   Eos 1 Not Estab. %   Basos 1 Not Estab. %   Neutrophils Absolute 3.0 1.4 - 7.0 x10E3/uL   Lymphocytes Absolute 1.8 0.7 - 3.1 x10E3/uL   Monocytes Absolute 0.5 0.1 - 0.9 x10E3/uL   EOS (ABSOLUTE) 0.1 0.0 - 0.4 x10E3/uL   Basophils Absolute 0.0 0.0 - 0.2 x10E3/uL   Immature Granulocytes 0 Not Estab. %   Immature Grans (Abs) 0.0 0.0 - 0.1 x10E3/uL  Comprehensive metabolic panel  Result Value Ref Range   Glucose 95 65 - 99 mg/dL   BUN 9 6 - 24 mg/dL   Creatinine, Ser 0.70 0.57 - 1.00 mg/dL   GFR calc non Af Amer 99 >59 mL/min/1.73   GFR calc Af Amer 114 >59 mL/min/1.73   BUN/Creatinine Ratio 13 9 - 23   Sodium 135 134 - 144 mmol/L   Potassium 3.8 3.5 - 5.2 mmol/L   Chloride 96 96 - 106 mmol/L   CO2 22 18 - 29 mmol/L   Calcium 9.2 8.7 - 10.2 mg/dL   Total Protein 7.3 6.0 - 8.5 g/dL   Albumin 4.6 3.5 - 5.5 g/dL   Globulin, Total 2.7 1.5 - 4.5 g/dL   Albumin/Globulin Ratio 1.7 1.2 - 2.2   Bilirubin Total 0.2 0.0 - 1.2 mg/dL   Alkaline Phosphatase 95 39 - 117 IU/L   AST 19 0 - 40 IU/L   ALT 14 0 - 32  IU/L  TSH  Result Value Ref Range   TSH 1.120 0.450 - 4.500 uIU/mL  UA/M w/rflx Culture, Routine  Result Value Ref Range   Specific Gravity, UA 1.010 1.005 - 1.030   pH, UA 6.0 5.0 - 7.5   Color, UA Yellow Yellow   Appearance Ur Clear Clear   Leukocytes, UA 1+ (A) Negative   Protein, UA Negative Negative/Trace   Glucose, UA Negative Negative   Ketones, UA Negative Negative   RBC, UA 1+ (A) Negative   Bilirubin, UA Negative Negative   Urobilinogen, Ur 0.2 0.2 - 1.0 mg/dL   Nitrite, UA Negative Negative   Microscopic Examination See below:  Urinalysis Reflex Comment   Hepatitis C Antibody  Result Value Ref Range   Hep C Virus Ab <0.1 0.0 - 0.9 s/co ratio  HIV antibody  Result Value Ref Range   HIV Screen 4th Generation wRfx Non Reactive Non Reactive  Urine Culture, Routine  Result Value Ref Range   Urine Culture, Routine Final report    Urine Culture result 1 No growth   Lipid Panel w/o Chol/HDL Ratio  Result Value Ref Range   Cholesterol, Total 225 (H) 100 - 199 mg/dL   Triglycerides 232 (H) 0 - 149 mg/dL   HDL 93 >39 mg/dL   VLDL Cholesterol Cal 46 (H) 5 - 40 mg/dL   LDL Calculated 86 0 - 99 mg/dL  Specimen status report  Result Value Ref Range   specimen status report Comment   IGP, Aptima HPV, rfx 16/18,45  Result Value Ref Range   DIAGNOSIS: Comment    Specimen adequacy: Comment    CLINICIAN PROVIDED ICD10: Comment    Performed by: Comment    QC reviewed by: Comment    Electronically signed by: Comment    PAP SMEAR COMMENT .    Note: Comment    Test Methodology Comment    HPV Aptima Positive (A) Negative   HPV Genotype 16 Negative Negative   HPV Genotype 18,45 Negative Negative      Assessment & Plan:   Problem List Items Addressed This Visit      Musculoskeletal and Integument   DDD (degenerative disc disease), lumbar    Will check on her pain center referral. Call with any concerns.       Rosacea    Will treat with metronidazole. May have to  use metrogel. Call with any concerns. Continue to monitor.         Other   Depression    Stable. Continue current regimen. Call with any concerns.       Relevant Medications   traZODone (DESYREL) 100 MG tablet   Insomnia    Stable. Will continue her current regimen. Concern for OSA- will obtain sleep study. Call with any concerns.        Other Visit Diagnoses    Snoring    -  Primary   Will check sleep study. Await results. Call with any concerns.    Relevant Orders   Ambulatory referral to Sleep Studies       Follow up plan: Return in about 6 months (around 03/14/2017) for follow up.

## 2016-09-27 ENCOUNTER — Other Ambulatory Visit: Payer: Self-pay | Admitting: Family Medicine

## 2016-10-04 ENCOUNTER — Telehealth: Payer: Self-pay | Admitting: Family Medicine

## 2016-10-04 DIAGNOSIS — L719 Rosacea, unspecified: Secondary | ICD-10-CM

## 2016-10-04 NOTE — Telephone Encounter (Signed)
Patient needs a referral to [facility] regarding her issue with rash, bumps she saw Dr Wynetta Emery with these issues and does not seem to be getting any better.  Thank You

## 2016-10-04 NOTE — Telephone Encounter (Signed)
Routing to provider to enter in a referral.

## 2016-10-17 ENCOUNTER — Ambulatory Visit (INDEPENDENT_AMBULATORY_CARE_PROVIDER_SITE_OTHER): Payer: Medicaid Other | Admitting: Family Medicine

## 2016-10-17 ENCOUNTER — Encounter: Payer: Self-pay | Admitting: Family Medicine

## 2016-10-17 ENCOUNTER — Telehealth: Payer: Self-pay | Admitting: Family Medicine

## 2016-10-17 VITALS — BP 139/79 | HR 73 | Temp 97.8°F | Wt 151.0 lb

## 2016-10-17 DIAGNOSIS — R21 Rash and other nonspecific skin eruption: Secondary | ICD-10-CM | POA: Diagnosis not present

## 2016-10-17 MED ORDER — TRIAMCINOLONE ACETONIDE 0.1 % EX CREA: 1.0000 "application " | TOPICAL_CREAM | Freq: Two times a day (BID) | CUTANEOUS | 1 refills | Status: DC

## 2016-10-17 MED ORDER — HYDROXYZINE HCL 25 MG PO TABS: 25.0000 mg | ORAL_TABLET | Freq: Three times a day (TID) | ORAL | 0 refills | Status: DC | PRN

## 2016-10-17 NOTE — Progress Notes (Signed)
   BP 139/79   Pulse 73   Temp 97.8 F (36.6 C)   Wt 151 lb (68.5 kg)   LMP 06/17/2011   SpO2 97%   BMI 27.53 kg/m    Subjective:    Patient ID: Jill Poole, female    DOB: 1961-07-16, 56 y.o.   MRN: [id number]  HPI: Jill Poole is a 56 y.o. female  Chief Complaint  Patient presents with  . Facial Swelling    x 2 months, was given abx at last visit, started clearing up and now back again. Itches and burns. Neosporin helps a little.    Patient presents for follow up regarding a rash that has been present diffusely on face x 2 months. Itching, burning, red. Does not notice anything that makes it better or worse. Thought her foundation was causing it at first, but d/c'd it after one use. No new soaps, foods, medications. Tried benadryl cream, hydrocortisone, and flagyl tablets with minimal relief. Uses cetaphil cream intermittently. No other assoc. Sxs.   Dermatology referral placed, appt scheduled for May.   Relevant past medical, surgical, family and social history reviewed and updated as indicated. Interim medical history since our last visit reviewed. Allergies and medications reviewed and updated.  Review of Systems  Constitutional: Negative.   HENT: Negative.   Respiratory: Negative.   Cardiovascular: Negative.   Gastrointestinal: Negative.   Genitourinary: Negative.   Musculoskeletal: Negative.   Skin: Negative.   Neurological: Negative.   Psychiatric/Behavioral: Negative.     Per HPI unless specifically indicated above     Objective:    BP 139/79   Pulse 73   Temp 97.8 F (36.6 C)   Wt 151 lb (68.5 kg)   LMP 06/17/2011   SpO2 97%   BMI 27.53 kg/m   Wt Readings from Last 3 Encounters:  10/17/16 151 lb (68.5 kg)  09/14/16 144 lb 9.6 oz (65.6 kg)  06/14/16 131 lb 9.3 oz (59.7 kg)    Physical Exam  Constitutional: She is oriented to person, place, and time. She appears well-developed and well-nourished.  HENT:  Head: Atraumatic.  Eyes: Conjunctivae  are normal. Pupils are equal, round, and reactive to light.  Neck: Normal range of motion. Neck supple.  Cardiovascular: Normal rate and normal heart sounds.   Pulmonary/Chest: Effort normal and breath sounds normal. No respiratory distress.  Musculoskeletal: Normal range of motion.  Neurological: She is alert and oriented to person, place, and time.  Skin: Skin is warm and dry. Rash (erythematous, diffuse macular rash across face. skin appears mildly dry, no scaling, acne bumps, or pustules present. ) noted.  Psychiatric: She has a normal mood and affect. Her behavior is normal.  Nursing note and vitals reviewed.     Assessment & Plan:   Problem List Items Addressed This Visit    None    Visit Diagnoses    Rash    -  Primary   Unclear etiology, will try hydroxyzine and triamcinolone for relief. Gentle unscented products, good moisturizing regimen. Follow up with Derm as scheduled.       Follow up plan: Return for with dermatology.

## 2016-10-17 NOTE — Patient Instructions (Signed)
Follow up as needed

## 2016-10-17 NOTE — Telephone Encounter (Signed)
Patient scheduled appt with Merrie Roof

## 2016-11-26 ENCOUNTER — Other Ambulatory Visit: Payer: Self-pay | Admitting: Family Medicine

## 2016-11-26 NOTE — Telephone Encounter (Signed)
Routing to provider. Appt 02/25/17.

## 2017-01-03 DIAGNOSIS — M5431 Sciatica, right side: Secondary | ICD-10-CM | POA: Diagnosis not present

## 2017-01-03 DIAGNOSIS — M545 Low back pain: Secondary | ICD-10-CM | POA: Diagnosis not present

## 2017-01-25 ENCOUNTER — Other Ambulatory Visit: Payer: Self-pay | Admitting: Family Medicine

## 2017-02-25 ENCOUNTER — Ambulatory Visit (INDEPENDENT_AMBULATORY_CARE_PROVIDER_SITE_OTHER): Payer: Medicaid Other | Admitting: Family Medicine

## 2017-02-25 ENCOUNTER — Encounter: Payer: Self-pay | Admitting: Family Medicine

## 2017-02-25 ENCOUNTER — Other Ambulatory Visit: Payer: Self-pay | Admitting: Family Medicine

## 2017-02-25 VITALS — BP 132/81 | HR 85 | Temp 98.4°F | Wt 144.4 lb

## 2017-02-25 DIAGNOSIS — J449 Chronic obstructive pulmonary disease, unspecified: Secondary | ICD-10-CM

## 2017-02-25 DIAGNOSIS — G4700 Insomnia, unspecified: Secondary | ICD-10-CM | POA: Diagnosis not present

## 2017-02-25 DIAGNOSIS — K219 Gastro-esophageal reflux disease without esophagitis: Secondary | ICD-10-CM | POA: Insufficient documentation

## 2017-02-25 DIAGNOSIS — M25551 Pain in right hip: Secondary | ICD-10-CM | POA: Diagnosis not present

## 2017-02-25 DIAGNOSIS — L719 Rosacea, unspecified: Secondary | ICD-10-CM

## 2017-02-25 DIAGNOSIS — M5136 Other intervertebral disc degeneration, lumbar region: Secondary | ICD-10-CM | POA: Diagnosis not present

## 2017-02-25 DIAGNOSIS — F331 Major depressive disorder, recurrent, moderate: Secondary | ICD-10-CM | POA: Diagnosis not present

## 2017-02-25 DIAGNOSIS — M51369 Other intervertebral disc degeneration, lumbar region without mention of lumbar back pain or lower extremity pain: Secondary | ICD-10-CM

## 2017-02-25 MED ORDER — CYCLOBENZAPRINE HCL 10 MG PO TABS: 10.0000 mg | ORAL_TABLET | Freq: Every day | ORAL | 0 refills | Status: DC

## 2017-02-25 MED ORDER — FLUOXETINE HCL 20 MG PO CAPS: 20.0000 mg | ORAL_CAPSULE | Freq: Every day | ORAL | 3 refills | Status: DC

## 2017-02-25 MED ORDER — ALBUTEROL SULFATE HFA 108 (90 BASE) MCG/ACT IN AERS: 2.0000 | INHALATION_SPRAY | Freq: Four times a day (QID) | RESPIRATORY_TRACT | 1 refills | Status: DC | PRN

## 2017-02-25 MED ORDER — TRAZODONE HCL 100 MG PO TABS: 100.0000 mg | ORAL_TABLET | Freq: Every day | ORAL | 1 refills | Status: DC

## 2017-02-25 MED ORDER — FLUTICASONE-SALMETEROL 250-50 MCG/DOSE IN AEPB: 1.0000 | INHALATION_SPRAY | Freq: Two times a day (BID) | RESPIRATORY_TRACT | 3 refills | Status: DC

## 2017-02-25 MED ORDER — NAPROXEN 500 MG PO TABS: 500.0000 mg | ORAL_TABLET | Freq: Two times a day (BID) | ORAL | 1 refills | Status: DC

## 2017-02-25 NOTE — Assessment & Plan Note (Signed)
Stable on current regimen. Continue current regimen. Continue to monitor. Call with any concerns.  

## 2017-02-25 NOTE — Patient Instructions (Signed)
Sciatica Rehab  Ask your health care provider which exercises are safe for you. Do exercises exactly as told by your health care provider and adjust them as directed. It is normal to feel mild stretching, pulling, tightness, or discomfort as you do these exercises, but you should stop right away if you feel sudden pain or your pain gets worse. Do not begin these exercises until told by your health care provider.  Stretching and range of motion exercises  These exercises warm up your muscles and joints and improve the movement and flexibility of your hips and your back. These exercises also help to relieve pain, numbness, and tingling.  Exercise A: Sciatic nerve glide  1. Sit in a chair with your head facing down toward your chest. Place your hands behind your back. Let your shoulders slump forward.  2. Slowly straighten one of your knees while you tilt your head back as if you are looking toward the ceiling. Only straighten your leg as far as you can without making your symptoms worse.  3. Hold for __________ seconds.  4. Slowly return to the starting position.  5. Repeat with your other leg.  Repeat __________ times. Complete this exercise __________ times a day.  Exercise B: Knee to chest with hip adduction and internal rotation    1. Lie on your back on a firm surface with both legs straight.  2. Bend one of your knees and move it up toward your chest until you feel a gentle stretch in your lower back and buttock. Then, move your knee toward the shoulder that is on the opposite side from your leg.  ? Hold your leg in this position by holding onto the front of your knee.  3. Hold for __________ seconds.  4. Slowly return to the starting position.  5. Repeat with your other leg.  Repeat __________ times. Complete this exercise __________ times a day.  Exercise C: Prone extension on elbows    1. Lie on your abdomen on a firm surface. A bed may be too soft for this exercise.  2. Prop yourself up on your  elbows.  3. Use your arms to help lift your chest up until you feel a gentle stretch in your abdomen and your lower back.  ? This will place some of your body weight on your elbows. If this is uncomfortable, try stacking pillows under your chest.  ? Your hips should stay down, against the surface that you are lying on. Keep your hip and back muscles relaxed.  4. Hold for __________ seconds.  5. Slowly relax your upper body and return to the starting position.  Repeat __________ times. Complete this exercise __________ times a day.  Strengthening exercises  These exercises build strength and endurance in your back. Endurance is the ability to use your muscles for a long time, even after they get tired.  Exercise D: Pelvic tilt  1. Lie on your back on a firm surface. Bend your knees and keep your feet flat.  2. Tense your abdominal muscles. Tip your pelvis up toward the ceiling and flatten your lower back into the floor.  ? To help with this exercise, you may place a small towel under your lower back and try to push your back into the towel.  3. Hold for __________ seconds.  4. Let your muscles relax completely before you repeat this exercise.  Repeat __________ times. Complete this exercise __________ times a day.  Exercise E: Alternating arm and leg raises      1. Get on your hands and knees on a firm surface. If you are on a hard floor, you may want to use padding to cushion your knees, such as an exercise mat.  2. Line up your arms and legs. Your hands should be below your shoulders, and your knees should be below your hips.  3. Lift your left leg behind you. At the same time, raise your right arm and straighten it in front of you.  ? Do not lift your leg higher than your hip.  ? Do not lift your arm higher than your shoulder.  ? Keep your abdominal and back muscles tight.  ? Keep your hips facing the ground.  ? Do not arch your back.  ? Keep your balance carefully, and do not hold your breath.  4. Hold for  __________ seconds.  5. Slowly return to the starting position and repeat with your right leg and your left arm.  Repeat __________ times. Complete this exercise __________ times a day.  Posture and body mechanics    Body mechanics refers to the movements and positions of your body while you do your daily activities. Posture is part of body mechanics. Good posture and healthy body mechanics can help to relieve stress in your body's tissues and joints. Good posture means that your spine is in its natural S-curve position (your spine is neutral), your shoulders are pulled back slightly, and your head is not tipped forward. The following are general guidelines for applying improved posture and body mechanics to your everyday activities.  Standing    · When standing, keep your spine neutral and your feet about hip-width apart. Keep a slight bend in your knees. Your ears, shoulders, and hips should line up.  · When you do a task in which you stand in one place for a long time, place one foot up on a stable object that is 2-4 inches (5-10 cm) high, such as a footstool. This helps keep your spine neutral.  Sitting    · When sitting, keep your spine neutral and keep your feet flat on the floor. Use a footrest, if necessary, and keep your thighs parallel to the floor. Avoid rounding your shoulders, and avoid tilting your head forward.  · When working at a desk or a computer, keep your desk at a height where your hands are slightly lower than your elbows. Slide your chair under your desk so you are close enough to maintain good posture.  · When working at a computer, place your monitor at a height where you are looking straight ahead and you do not have to tilt your head forward or downward to look at the screen.  Resting    · When lying down and resting, avoid positions that are most painful for you.  · If you have pain with activities such as sitting, bending, stooping, or squatting (flexion-based activities), lie in a  position in which your body does not bend very much. For example, avoid curling up on your side with your arms and knees near your chest (fetal position).  · If you have pain with activities such as standing for a long time or reaching with your arms (extension-based activities), lie with your spine in a neutral position and bend your knees slightly. Try the following positions:  ? Lying on your side with a pillow between your knees.  ? Lying on your back with a pillow under your knees.  Lifting    · When lifting   objects, keep your feet at least shoulder-width apart and tighten your abdominal muscles.  · Bend your knees and hips and keep your spine neutral. It is important to lift using the strength of your legs, not your back. Do not lock your knees straight out.  · Always ask for help to lift heavy or awkward objects.  This information is not intended to replace advice given to you by your health care provider. Make sure you discuss any questions you have with your health care provider.  Document Released: 08/20/2005 Document Revised: 04/26/2016 Document Reviewed: 05/06/2015  [employer]vier Interactive Patient Education © 2018 [employer]

## 2017-02-25 NOTE — Assessment & Plan Note (Signed)
Continue current regimen. Continue to monitor. Call with any concerns. Currently exacerbated due to hip injury, will add flexeril and stretches and recheck in 2 weeks.

## 2017-02-25 NOTE — Progress Notes (Signed)
BP 132/81 (BP Location: Left Arm, Patient Position: Sitting, Cuff Size: Normal)   Pulse 85   Temp 98.4 F (36.9 C)   Wt 144 lb 6 oz (65.5 kg)   LMP 06/17/2011   SpO2 98%   BMI 26.32 kg/m    Subjective:    Patient ID: Jill Poole, female    DOB: 04-20-1961, 56 y.o.   MRN: [id number]  HPI: Zarin Hagmann is a 56 y.o. female  Chief Complaint  Patient presents with  . Insomnia  . Depression  . Anxiety  . Back Pain  . Gastroesophageal Reflux  . Asthma   DEPRESSION/Anxiety- hanging in there. Stable. Her mom is currently fighting cancer, so that's been really tough for her. Stable on her medicine. Feeling well Mood status: controlled Satisfied with current treatment?: yes Symptom severity: mild  Duration of current treatment : chronic Side effects: no Medication compliance: excellent compliance Psychotherapy/counseling: no  Previous psychiatric medications: prozac Depressed mood: no Anxious mood: no Anhedonia: no Significant weight loss or gain: no Insomnia: no hard to stay asleep Fatigue: no Feelings of worthlessness or guilt: no Impaired concentration/indecisiveness: no Suicidal ideations: no Hopelessness: no Crying spells: no Depression screen [facility] 2/9 02/25/2017 09/14/2016 05/14/2016 02/03/2016 01/13/2016  Decreased Interest 0 1 0 0 0  Down, Depressed, Hopeless 0 0 0 0 0  PHQ - 2 Score 0 1 0 0 0   INSOMNIA Duration: chronic Satisfied with sleep quality: yes Difficulty falling asleep: no Difficulty staying asleep: no Waking a few hours after sleep onset: no Early morning awakenings: no Daytime hypersomnolence: no Wakes feeling refreshed: yes Good sleep hygiene: yes Apnea: no Snoring: yes Depressed/anxious mood: yes Recent stress: yes Restless legs/nocturnal leg cramps: no Chronic pain/arthritis: no History of sleep study: no Treatments attempted: doing well on trazodone   ASTHMA Asthma status: controlled Satisfied with current treatment?:  yes Albuterol/rescue inhaler frequency: rarely Dyspnea frequency: rarely Wheezing frequency: rarely Cough frequency: rarely Nocturnal symptom frequency: rarely Limitation of activity: no  GERD GERD control status: controlled  Satisfied with current treatment? yes Heartburn frequency: never on meds Medication side effects: no  Medication compliance: excellent Dysphagia: no Odynophagia:  no Hematemesis: no Blood in stool: no EGD: no  HIP PAIN- went to [facility], starting to get worse.  Duration: 3 weeks Involved hip: right  Mechanism of injury: stepped down funny Location: in the joint Onset: sudden  Severity: 7/10  Quality: burning Frequency: constant Radiation: into her thigh Aggravating factors: walking around    Alleviating factors: ice, percocet   Status: better Treatments attempted: pain pills, rest, ice and aleve   Relief with NSAIDs?: mild Weakness with weight bearing: no Weakness with walking: no Paresthesias / decreased sensation: yes Swelling: no Redness:no Fevers: no   Relevant past medical, surgical, family and social history reviewed and updated as indicated. Interim medical history since our last visit reviewed. Allergies and medications reviewed and updated.  Review of Systems  Constitutional: Negative.   Respiratory: Negative.   Cardiovascular: Negative.   Gastrointestinal: Negative.   Musculoskeletal: Positive for arthralgias and myalgias. Negative for back pain, gait problem, joint swelling, neck pain and neck stiffness.  Neurological: Negative.   Psychiatric/Behavioral: Negative.     Per HPI unless specifically indicated above     Objective:    BP 132/81 (BP Location: Left Arm, Patient Position: Sitting, Cuff Size: Normal)   Pulse 85   Temp 98.4 F (36.9 C)   Wt 144 lb 6 oz (65.5 kg)   LMP 06/17/2011  SpO2 98%   BMI 26.32 kg/m   Wt Readings from Last 3 Encounters:  02/25/17 144 lb 6 oz (65.5 kg)  10/17/16 151 lb (68.5 kg)  09/14/16  144 lb 9.6 oz (65.6 kg)    Physical Exam  Constitutional: She is oriented to person, place, and time. She appears well-developed and well-nourished. No distress.  HENT:  Head: Normocephalic and atraumatic.  Right Ear: Hearing normal.  Left Ear: Hearing normal.  Nose: Nose normal.  Eyes: Conjunctivae and lids are normal. Right eye exhibits no discharge. Left eye exhibits no discharge. No scleral icterus.  Cardiovascular: Normal rate, regular rhythm, normal heart sounds and intact distal pulses.  Exam reveals no gallop and no friction rub.   No murmur heard. Pulmonary/Chest: Effort normal and breath sounds normal. No respiratory distress. She has no wheezes. She has no rales. She exhibits no tenderness.  Neurological: She is alert and oriented to person, place, and time.  Skin: Skin is warm, dry and intact. No rash noted. She is not diaphoretic. No erythema. No pallor.  Psychiatric: She has a normal mood and affect. Her speech is normal and behavior is normal. Judgment and thought content normal. Cognition and memory are normal.  Nursing note and vitals reviewed. Hip Exam: Right     Tenderness to palpation:      Greater trochanter: yes      Anterior superior iliac spine: no     Anterior hip: no     Iliac crest: no     Iliac tubercle: no     Pubic tubercle: no     SI joint: no     Piriformis: yes        Range of Motion:     Flexion: Decreased    Extension: Decreased    Abduction: Decreased    Adduction: Decreased    Internal rotation: Decreased    External rotation: Decreased     Muscle Strength:  5/5 bilaterally     Special Tests:    Ober test: negative    FABER test:negative    Results for orders placed or performed in visit on 06/14/16  Microscopic Examination  Result Value Ref Range   WBC, UA 6-10 (A) 0 - 5 /hpf   RBC, UA 3-10 (A) 0 - 2 /hpf   Epithelial Cells (non renal) 0-10 0 - 10 /hpf   Bacteria, UA Moderate (A) None seen/Few  CBC with Differential/Platelet   Result Value Ref Range   WBC 5.4 3.4 - 10.8 x10E3/uL   RBC 3.73 (L) 3.77 - 5.28 x10E6/uL   Hemoglobin 13.3 11.1 - 15.9 g/dL   Hematocrit 38.1 34.0 - 46.6 %   MCV 102 (H) 79 - 97 fL   MCH 35.7 (H) 26.6 - 33.0 pg   MCHC 34.9 31.5 - 35.7 g/dL   RDW 13.7 12.3 - 15.4 %   Platelets 210 150 - 379 x10E3/uL   Neutrophils 55 Not Estab. %   Lymphs 34 Not Estab. %   Monocytes 9 Not Estab. %   Eos 1 Not Estab. %   Basos 1 Not Estab. %   Neutrophils Absolute 3.0 1.4 - 7.0 x10E3/uL   Lymphocytes Absolute 1.8 0.7 - 3.1 x10E3/uL   Monocytes Absolute 0.5 0.1 - 0.9 x10E3/uL   EOS (ABSOLUTE) 0.1 0.0 - 0.4 x10E3/uL   Basophils Absolute 0.0 0.0 - 0.2 x10E3/uL   Immature Granulocytes 0 Not Estab. %   Immature Grans (Abs) 0.0 0.0 - 0.1 x10E3/uL  Comprehensive metabolic panel  Result Value Ref Range   Glucose 95 65 - 99 mg/dL   BUN 9 6 - 24 mg/dL   Creatinine, Ser 0.70 0.57 - 1.00 mg/dL   GFR calc non Af Amer 99 >59 mL/min/1.73   GFR calc Af Amer 114 >59 mL/min/1.73   BUN/Creatinine Ratio 13 9 - 23   Sodium 135 134 - 144 mmol/L   Potassium 3.8 3.5 - 5.2 mmol/L   Chloride 96 96 - 106 mmol/L   CO2 22 18 - 29 mmol/L   Calcium 9.2 8.7 - 10.2 mg/dL   Total Protein 7.3 6.0 - 8.5 g/dL   Albumin 4.6 3.5 - 5.5 g/dL   Globulin, Total 2.7 1.5 - 4.5 g/dL   Albumin/Globulin Ratio 1.7 1.2 - 2.2   Bilirubin Total 0.2 0.0 - 1.2 mg/dL   Alkaline Phosphatase 95 39 - 117 IU/L   AST 19 0 - 40 IU/L   ALT 14 0 - 32 IU/L  TSH  Result Value Ref Range   TSH 1.120 0.450 - 4.500 uIU/mL  UA/M w/rflx Culture, Routine  Result Value Ref Range   Specific Gravity, UA 1.010 1.005 - 1.030   pH, UA 6.0 5.0 - 7.5   Color, UA Yellow Yellow   Appearance Ur Clear Clear   Leukocytes, UA 1+ (A) Negative   Protein, UA Negative Negative/Trace   Glucose, UA Negative Negative   Ketones, UA Negative Negative   RBC, UA 1+ (A) Negative   Bilirubin, UA Negative Negative   Urobilinogen, Ur 0.2 0.2 - 1.0 mg/dL   Nitrite, UA  Negative Negative   Microscopic Examination See below:    Urinalysis Reflex Comment   Hepatitis C Antibody  Result Value Ref Range   Hep C Virus Ab <0.1 0.0 - 0.9 s/co ratio  HIV antibody  Result Value Ref Range   HIV Screen 4th Generation wRfx Non Reactive Non Reactive  Urine Culture, Routine  Result Value Ref Range   Urine Culture, Routine Final report    Organism ID, Bacteria No growth   Lipid Panel w/o Chol/HDL Ratio  Result Value Ref Range   Cholesterol, Total 225 (H) 100 - 199 mg/dL   Triglycerides 232 (H) 0 - 149 mg/dL   HDL 93 >39 mg/dL   VLDL Cholesterol Cal 46 (H) 5 - 40 mg/dL   LDL Calculated 86 0 - 99 mg/dL  Specimen status report  Result Value Ref Range   specimen status report Comment   IGP, Aptima HPV, rfx 16/18,45  Result Value Ref Range   DIAGNOSIS: Comment    Specimen adequacy: Comment    CLINICIAN PROVIDED ICD10: Comment    Performed by: Comment    QC reviewed by: Comment    Electronically signed by: Comment    PAP Smear Comment .    Note: Comment    Test Methodology Comment    HPV Aptima Positive (A) Negative   HPV Genotype 16 Negative Negative   HPV Genotype 18,45 Negative Negative      Assessment & Plan:   Problem List Items Addressed This Visit      Respiratory   COPD (chronic obstructive pulmonary disease) ([place])    Stable on current regimen. Continue current regimen. Continue to monitor. Call with any concerns.       Relevant Medications   Fluticasone-Salmeterol (ADVAIR) 250-50 MCG/DOSE AEPB   albuterol (PROVENTIL HFA;VENTOLIN HFA) 108 (90 Base) MCG/ACT inhaler     Digestive   GERD (gastroesophageal reflux disease)    Stable on current  regimen. Continue current regimen. Continue to monitor. Call with any concerns.         Musculoskeletal and Integument   DDD (degenerative disc disease), lumbar    Continue current regimen. Continue to monitor. Call with any concerns. Currently exacerbated due to hip injury, will add flexeril and  stretches and recheck in 2 weeks.       Relevant Medications   cyclobenzaprine (FLEXERIL) 10 MG tablet   naproxen (NAPROSYN) 500 MG tablet   Rosacea    Stable on current regimen. Continue current regimen. Continue to monitor. Call with any concerns.         Other   Depression - Primary    Stable on current regimen. Continue current regimen. Continue to monitor. Call with any concerns.       Relevant Medications   traZODone (DESYREL) 100 MG tablet   FLUoxetine (PROZAC) 20 MG capsule   Insomnia    Stable on current regimen. Continue current regimen. Continue to monitor. Call with any concerns.        Other Visit Diagnoses    Right hip pain       Seems to be more sciatic. Will start flexeril and stretches and recheck in 2 weeks if not better.        Follow up plan: Return 2-3 weeks, for follow up hip pain.

## 2017-03-18 ENCOUNTER — Ambulatory Visit: Payer: Medicaid Other | Admitting: Family Medicine

## 2017-05-13 ENCOUNTER — Telehealth: Payer: Self-pay | Admitting: Family Medicine

## 2017-05-13 MED ORDER — TRIAMCINOLONE ACETONIDE 0.1 % EX CREA: 1.0000 "application " | TOPICAL_CREAM | Freq: Two times a day (BID) | CUTANEOUS | 1 refills | Status: DC

## 2017-05-13 NOTE — Telephone Encounter (Signed)
Routing to provider  

## 2017-05-13 NOTE — Telephone Encounter (Signed)
Patient called to request refill on her prescription for triamcinolone cream 0.1%. Patient stated that there were no refills on medication but was informed by provider that if she needed more cream to let the provider know.  Please Advise. Thank you

## 2017-07-02 ENCOUNTER — Ambulatory Visit: Payer: Medicaid Other | Admitting: Family Medicine

## 2017-07-02 ENCOUNTER — Ambulatory Visit (INDEPENDENT_AMBULATORY_CARE_PROVIDER_SITE_OTHER): Payer: Medicaid Other

## 2017-07-02 DIAGNOSIS — Z23 Encounter for immunization: Secondary | ICD-10-CM

## 2017-08-29 ENCOUNTER — Ambulatory Visit: Payer: Medicaid Other | Admitting: Family Medicine

## 2017-11-29 ENCOUNTER — Encounter: Payer: Self-pay | Admitting: Family Medicine

## 2017-11-29 ENCOUNTER — Ambulatory Visit: Payer: Medicaid Other | Admitting: Family Medicine

## 2017-11-29 VITALS — BP 148/88 | HR 78 | Temp 98.7°F | Wt 137.4 lb

## 2017-11-29 DIAGNOSIS — F331 Major depressive disorder, recurrent, moderate: Secondary | ICD-10-CM | POA: Diagnosis not present

## 2017-11-29 DIAGNOSIS — Z1231 Encounter for screening mammogram for malignant neoplasm of breast: Secondary | ICD-10-CM | POA: Diagnosis not present

## 2017-11-29 DIAGNOSIS — K219 Gastro-esophageal reflux disease without esophagitis: Secondary | ICD-10-CM

## 2017-11-29 DIAGNOSIS — Z1239 Encounter for other screening for malignant neoplasm of breast: Secondary | ICD-10-CM

## 2017-11-29 DIAGNOSIS — J449 Chronic obstructive pulmonary disease, unspecified: Secondary | ICD-10-CM | POA: Diagnosis not present

## 2017-11-29 MED ORDER — FLUOXETINE HCL 20 MG PO CAPS: 20.0000 mg | ORAL_CAPSULE | Freq: Every day | ORAL | 3 refills | Status: DC

## 2017-11-29 MED ORDER — FLUTICASONE-SALMETEROL 250-50 MCG/DOSE IN AEPB: 1.0000 | INHALATION_SPRAY | Freq: Two times a day (BID) | RESPIRATORY_TRACT | 3 refills | Status: DC

## 2017-11-29 MED ORDER — ALBUTEROL SULFATE HFA 108 (90 BASE) MCG/ACT IN AERS: 2.0000 | INHALATION_SPRAY | Freq: Four times a day (QID) | RESPIRATORY_TRACT | 1 refills | Status: DC | PRN

## 2017-11-29 MED ORDER — HYDROXYZINE HCL 25 MG PO TABS: 25.0000 mg | ORAL_TABLET | Freq: Three times a day (TID) | ORAL | 6 refills | Status: DC | PRN

## 2017-11-29 MED ORDER — TRAZODONE HCL 100 MG PO TABS: 100.0000 mg | ORAL_TABLET | Freq: Every day | ORAL | 1 refills | Status: DC

## 2017-11-29 MED ORDER — NAPROXEN 500 MG PO TABS: 500.0000 mg | ORAL_TABLET | Freq: Two times a day (BID) | ORAL | 1 refills | Status: DC

## 2017-11-29 MED ORDER — GABAPENTIN 300 MG PO CAPS: 300.0000 mg | ORAL_CAPSULE | Freq: Three times a day (TID) | ORAL | 1 refills | Status: DC

## 2017-11-29 MED ORDER — OMEPRAZOLE 20 MG PO CPDR: 20.0000 mg | DELAYED_RELEASE_CAPSULE | Freq: Every day | ORAL | 1 refills | Status: DC

## 2017-11-29 NOTE — Assessment & Plan Note (Signed)
Under good control. Continue current regimen. Continue to monitor. Call with any concerns. Refills given today. 

## 2017-11-29 NOTE — Patient Instructions (Signed)
Gabapentin- 1 cap at bedtime for 1 week, then 1 cap in AM and 1 cap at bedtime for 1 week, then 1 cap 3x a day   [facility]: [address], [place], [place] [address]  Phone: [phone]

## 2017-11-29 NOTE — Progress Notes (Signed)
BP (!) 148/88 (BP Location: Left Arm, Patient Position: Sitting, Cuff Size: Normal)   Pulse 78   Temp 98.7 F (37.1 C)   Wt 137 lb 6 oz (62.3 kg)   LMP 06/17/2011   SpO2 97%   BMI 25.05 kg/m    Subjective:    Patient ID: Jill Poole, female    DOB: [birth year], 57 y.o.   MRN: [id number]  HPI: Jill Poole is a 57 y.o. female  Chief Complaint  Patient presents with  . Depression  . COPD  . Gastroesophageal Reflux   Has been off of her medicine for about a month  COPD COPD status: controlled Satisfied with current treatment?: yes Oxygen use: no Dyspnea frequency:  Cough frequency:  Rescue inhaler frequency:   Limitation of activity: no Productive cough:  Last Spirometry:  Pneumovax: Up to Date Influenza: Up to Date  DEPRESSION Mood status: exacerbated Satisfied with current treatment?: yes Symptom severity: moderate  Duration of current treatment : chronic Side effects: no Medication compliance: fair compliance Psychotherapy/counseling: no  Previous psychiatric medications: prozac Depressed mood: yes Anxious mood: no Anhedonia: no Significant weight loss or gain: no Insomnia: yes hard to stay asleep Fatigue: no Feelings of worthlessness or guilt: no Impaired concentration/indecisiveness: no Suicidal ideations: no Hopelessness: no Crying spells: no Depression screen [facility] 2/9 11/29/2017 02/25/2017 09/14/2016 05/14/2016 02/03/2016  Decreased Interest 0 0 1 0 0  Down, Depressed, Hopeless 0 0 0 0 0  PHQ - 2 Score 0 0 1 0 0  Altered sleeping 3 - - - -  Tired, decreased energy 0 - - - -  Change in appetite 3 - - - -  Feeling bad or failure about yourself  0 - - - -  Trouble concentrating 0 - - - -  Moving slowly or fidgety/restless 0 - - - -  Suicidal thoughts 0 - - - -  PHQ-9 Score 6 - - - -   GERD GERD control status: exacerbated  Satisfied with current treatment? yes Heartburn frequency: daily Medication side effects: no  Medication compliance: has  been off of meds for about a month Dysphagia: no Odynophagia:  no Hematemesis: no Blood in stool: no EGD: no   Relevant past medical, surgical, family and social history reviewed and updated as indicated. Interim medical history since our last visit reviewed. Allergies and medications reviewed and updated.  Review of Systems  Constitutional: Negative.   Respiratory: Negative.   Cardiovascular: Negative.   Psychiatric/Behavioral: Positive for dysphoric mood. Negative for agitation, behavioral problems, confusion, decreased concentration, hallucinations, self-injury, sleep disturbance and suicidal ideas. The patient is nervous/anxious. The patient is not hyperactive.     Per HPI unless specifically indicated above     Objective:    BP (!) 148/88 (BP Location: Left Arm, Patient Position: Sitting, Cuff Size: Normal)   Pulse 78   Temp 98.7 F (37.1 C)   Wt 137 lb 6 oz (62.3 kg)   LMP 06/17/2011   SpO2 97%   BMI 25.05 kg/m   Wt Readings from Last 3 Encounters:  11/29/17 137 lb 6 oz (62.3 kg)  02/25/17 144 lb 6 oz (65.5 kg)  10/17/16 151 lb (68.5 kg)    Physical Exam  Constitutional: She is oriented to person, place, and time. She appears well-developed and well-nourished. No distress.  HENT:  Head: Normocephalic and atraumatic.  Right Ear: Hearing normal.  Left Ear: Hearing normal.  Nose: Nose normal.  Eyes: Conjunctivae and lids are normal. Right eye exhibits  no discharge. Left eye exhibits no discharge. No scleral icterus.  Cardiovascular: Normal rate, regular rhythm, normal heart sounds and intact distal pulses. Exam reveals no gallop and no friction rub.  No murmur heard. Pulmonary/Chest: Effort normal and breath sounds normal. No respiratory distress. She has no wheezes. She has no rales. She exhibits no tenderness.  Musculoskeletal: Normal range of motion.  Neurological: She is alert and oriented to person, place, and time.  Skin: Skin is warm, dry and intact. No  rash noted. She is not diaphoretic. No erythema. No pallor.  Psychiatric: She has a normal mood and affect. Her speech is normal and behavior is normal. Judgment and thought content normal. Cognition and memory are normal.  Nursing note and vitals reviewed.   Results for orders placed or performed in visit on 06/14/16  Microscopic Examination  Result Value Ref Range   WBC, UA 6-10 (A) 0 - 5 /hpf   RBC, UA 3-10 (A) 0 - 2 /hpf   Epithelial Cells (non renal) 0-10 0 - 10 /hpf   Bacteria, UA Moderate (A) None seen/Few  CBC with Differential/Platelet  Result Value Ref Range   WBC 5.4 3.4 - 10.8 x10E3/uL   RBC 3.73 (L) 3.77 - 5.28 x10E6/uL   Hemoglobin 13.3 11.1 - 15.9 g/dL   Hematocrit 38.1 34.0 - 46.6 %   MCV 102 (H) 79 - 97 fL   MCH 35.7 (H) 26.6 - 33.0 pg   MCHC 34.9 31.5 - 35.7 g/dL   RDW 13.7 12.3 - 15.4 %   Platelets 210 150 - 379 x10E3/uL   Neutrophils 55 Not Estab. %   Lymphs 34 Not Estab. %   Monocytes 9 Not Estab. %   Eos 1 Not Estab. %   Basos 1 Not Estab. %   Neutrophils Absolute 3.0 1.4 - 7.0 x10E3/uL   Lymphocytes Absolute 1.8 0.7 - 3.1 x10E3/uL   Monocytes Absolute 0.5 0.1 - 0.9 x10E3/uL   EOS (ABSOLUTE) 0.1 0.0 - 0.4 x10E3/uL   Basophils Absolute 0.0 0.0 - 0.2 x10E3/uL   Immature Granulocytes 0 Not Estab. %   Immature Grans (Abs) 0.0 0.0 - 0.1 x10E3/uL  Comprehensive metabolic panel  Result Value Ref Range   Glucose 95 65 - 99 mg/dL   BUN 9 6 - 24 mg/dL   Creatinine, Ser 0.70 0.57 - 1.00 mg/dL   GFR calc non Af Amer 99 >59 mL/min/1.73   GFR calc Af Amer 114 >59 mL/min/1.73   BUN/Creatinine Ratio 13 9 - 23   Sodium 135 134 - 144 mmol/L   Potassium 3.8 3.5 - 5.2 mmol/L   Chloride 96 96 - 106 mmol/L   CO2 22 18 - 29 mmol/L   Calcium 9.2 8.7 - 10.2 mg/dL   Total Protein 7.3 6.0 - 8.5 g/dL   Albumin 4.6 3.5 - 5.5 g/dL   Globulin, Total 2.7 1.5 - 4.5 g/dL   Albumin/Globulin Ratio 1.7 1.2 - 2.2   Bilirubin Total 0.2 0.0 - 1.2 mg/dL   Alkaline Phosphatase 95 39  - 117 IU/L   AST 19 0 - 40 IU/L   ALT 14 0 - 32 IU/L  TSH  Result Value Ref Range   TSH 1.120 0.450 - 4.500 uIU/mL  UA/M w/rflx Culture, Routine  Result Value Ref Range   Specific Gravity, UA 1.010 1.005 - 1.030   pH, UA 6.0 5.0 - 7.5   Color, UA Yellow Yellow   Appearance Ur Clear Clear   Leukocytes, UA 1+ (A) Negative  Protein, UA Negative Negative/Trace   Glucose, UA Negative Negative   Ketones, UA Negative Negative   RBC, UA 1+ (A) Negative   Bilirubin, UA Negative Negative   Urobilinogen, Ur 0.2 0.2 - 1.0 mg/dL   Nitrite, UA Negative Negative   Microscopic Examination See below:    Urinalysis Reflex Comment   Hepatitis C Antibody  Result Value Ref Range   Hep C Virus Ab <0.1 0.0 - 0.9 s/co ratio  HIV antibody  Result Value Ref Range   HIV Screen 4th Generation wRfx Non Reactive Non Reactive  Urine Culture, Routine  Result Value Ref Range   Urine Culture, Routine Final report    Organism ID, Bacteria No growth   Lipid Panel w/o Chol/HDL Ratio  Result Value Ref Range   Cholesterol, Total 225 (H) 100 - 199 mg/dL   Triglycerides 232 (H) 0 - 149 mg/dL   HDL 93 >39 mg/dL   VLDL Cholesterol Cal 46 (H) 5 - 40 mg/dL   LDL Calculated 86 0 - 99 mg/dL  Specimen status report  Result Value Ref Range   specimen status report Comment   IGP, Aptima HPV, rfx 16/18,45  Result Value Ref Range   DIAGNOSIS: Comment    Specimen adequacy: Comment    Clinician Provided ICD10 Comment    Performed by: Comment    QC reviewed by: Comment    Electronically signed by: Comment    PAP Smear Comment .    Note: Comment    Test Methodology Comment    HPV Aptima Positive (A) Negative   HPV Genotype 16 Negative Negative   HPV Genotype 18,45 Negative Negative      Assessment & Plan:   Problem List Items Addressed This Visit      Respiratory   COPD (chronic obstructive pulmonary disease) ([place])    Under good control. Continue current regimen. Continue to monitor. Call with any concerns.  Refills given today.       Relevant Medications   Fluticasone-Salmeterol (ADVAIR) 250-50 MCG/DOSE AEPB   albuterol (PROVENTIL HFA;VENTOLIN HFA) 108 (90 Base) MCG/ACT inhaler     Digestive   GERD (gastroesophageal reflux disease)    Under good control. Continue current regimen. Continue to monitor. Call with any concerns. Refills given today.       Relevant Medications   omeprazole (PRILOSEC) 20 MG capsule     Other   Depression - Primary    Not under great control- recently lost her mother. Will restart her medicine and recheck in 4-6 weeks.       Relevant Medications   traZODone (DESYREL) 100 MG tablet   hydrOXYzine (ATARAX/VISTARIL) 25 MG tablet   FLUoxetine (PROZAC) 20 MG capsule    Other Visit Diagnoses    Screening for breast cancer       Mammogram ordered today.   Relevant Orders   MM Digital Diagnostic Bilat       Follow up plan: Return 4-6 weeks, for Physical.

## 2017-11-29 NOTE — Assessment & Plan Note (Signed)
Not under great control- recently lost her mother. Will restart her medicine and recheck in 4-6 weeks.

## 2017-12-30 ENCOUNTER — Encounter: Payer: Medicaid Other | Admitting: Family Medicine

## 2018-01-09 ENCOUNTER — Other Ambulatory Visit: Payer: Self-pay | Admitting: Family Medicine

## 2018-01-13 ENCOUNTER — Other Ambulatory Visit: Payer: Self-pay | Admitting: Family Medicine

## 2018-01-14 ENCOUNTER — Other Ambulatory Visit: Payer: Self-pay | Admitting: Family Medicine

## 2018-02-10 ENCOUNTER — Other Ambulatory Visit: Payer: Self-pay | Admitting: Family Medicine

## 2018-03-02 ENCOUNTER — Other Ambulatory Visit: Payer: Self-pay | Admitting: Family Medicine

## 2018-03-07 ENCOUNTER — Other Ambulatory Visit: Payer: Self-pay | Admitting: Family Medicine

## 2018-03-21 ENCOUNTER — Other Ambulatory Visit: Payer: Self-pay | Admitting: Family Medicine

## 2018-03-21 NOTE — Telephone Encounter (Signed)
Needs appointment

## 2018-03-25 ENCOUNTER — Encounter: Payer: Self-pay | Admitting: Family Medicine

## 2018-03-26 ENCOUNTER — Other Ambulatory Visit: Payer: Self-pay | Admitting: Family Medicine

## 2018-04-01 ENCOUNTER — Other Ambulatory Visit: Payer: Self-pay

## 2018-04-01 ENCOUNTER — Ambulatory Visit (INDEPENDENT_AMBULATORY_CARE_PROVIDER_SITE_OTHER): Payer: Medicaid Other | Admitting: Family Medicine

## 2018-04-01 ENCOUNTER — Encounter: Payer: Self-pay | Admitting: Family Medicine

## 2018-04-01 VITALS — BP 187/96 | HR 71 | Temp 98.2°F | Ht 64.0 in | Wt 132.0 lb

## 2018-04-01 DIAGNOSIS — J449 Chronic obstructive pulmonary disease, unspecified: Secondary | ICD-10-CM

## 2018-04-01 DIAGNOSIS — F331 Major depressive disorder, recurrent, moderate: Secondary | ICD-10-CM | POA: Diagnosis not present

## 2018-04-01 DIAGNOSIS — K219 Gastro-esophageal reflux disease without esophagitis: Secondary | ICD-10-CM | POA: Diagnosis not present

## 2018-04-01 DIAGNOSIS — I10 Essential (primary) hypertension: Secondary | ICD-10-CM

## 2018-04-01 MED ORDER — NAPROXEN 500 MG PO TABS: 500.0000 mg | ORAL_TABLET | Freq: Two times a day (BID) | ORAL | 1 refills | Status: DC

## 2018-04-01 MED ORDER — LISINOPRIL 10 MG PO TABS: 10.0000 mg | ORAL_TABLET | Freq: Every day | ORAL | 3 refills | Status: DC

## 2018-04-01 MED ORDER — CYCLOBENZAPRINE HCL 10 MG PO TABS: 10.0000 mg | ORAL_TABLET | Freq: Every day | ORAL | 0 refills | Status: DC

## 2018-04-01 MED ORDER — HYDROXYZINE HCL 25 MG PO TABS: 25.0000 mg | ORAL_TABLET | Freq: Three times a day (TID) | ORAL | 1 refills | Status: DC | PRN

## 2018-04-01 MED ORDER — ALBUTEROL SULFATE HFA 108 (90 BASE) MCG/ACT IN AERS: INHALATION_SPRAY | RESPIRATORY_TRACT | 3 refills | Status: DC

## 2018-04-01 MED ORDER — GABAPENTIN 300 MG PO CAPS: 300.0000 mg | ORAL_CAPSULE | Freq: Three times a day (TID) | ORAL | 1 refills | Status: DC

## 2018-04-01 NOTE — Assessment & Plan Note (Signed)
Under good control. Continue current regimen. Continue to monitor. Call with any concerns. Refills given.  

## 2018-04-01 NOTE — Progress Notes (Signed)
BP (!) 187/96   Pulse 71   Temp 98.2 F (36.8 C) (Oral)   Ht 5\' 4"  (1.626 m)   Wt 132 lb (59.9 kg)   LMP 06/17/2011   SpO2 99%   BMI 22.66 kg/m    Subjective:    Patient ID: Jill Poole, female    DOB: Aug 18, 1961, 57 y.o.   MRN: [id number]  HPI: Jill Poole is a 57 y.o. female  Chief Complaint  Patient presents with  . Hypertension  . Depression   HYPERTENSION Hypertension status: uncontrolled  Satisfied with current treatment? no Duration of hypertension: unknown BP monitoring frequency:  not checking BP medication side effects:  Not on anything Previous BP meds: none Aspirin: no Recurrent headaches: no Visual changes: no Palpitations: no Dyspnea: no Chest pain: no Lower extremity edema: no Dizzy/lightheaded: no  DEPRESSION Mood status: controlled Satisfied with current treatment?: yes Symptom severity: mild  Duration of current treatment : chronic Side effects: no Medication compliance: excellent compliance Psychotherapy/counseling: no  Previous psychiatric medications: fluoxetine, hydroxyzine, trazodone Depressed mood: yes Anxious mood: yes Anhedonia: no Significant weight loss or gain: no Insomnia: no  Fatigue: no Feelings of worthlessness or guilt: no Impaired concentration/indecisiveness: no Suicidal ideations: no Hopelessness: no Crying spells: no Depression screen [facility] 2/9 04/01/2018 11/29/2017 02/25/2017 09/14/2016 05/14/2016  Decreased Interest 2 0 0 1 0  Down, Depressed, Hopeless 0 0 0 0 0  PHQ - 2 Score 2 0 0 1 0  Altered sleeping 0 3 - - -  Tired, decreased energy 0 0 - - -  Change in appetite 0 3 - - -  Feeling bad or failure about yourself  0 0 - - -  Trouble concentrating 1 0 - - -  Moving slowly or fidgety/restless 0 0 - - -  Suicidal thoughts 0 0 - - -  PHQ-9 Score 3 6 - - -  Difficult doing work/chores Somewhat difficult - - - -    Relevant past medical, surgical, family and social history reviewed and updated as indicated.  Interim medical history since our last visit reviewed. Allergies and medications reviewed and updated.  Review of Systems  Constitutional: Negative.   Respiratory: Negative.   Cardiovascular: Negative.   Gastrointestinal: Negative.   Psychiatric/Behavioral: Negative.     Per HPI unless specifically indicated above     Objective:    BP (!) 187/96   Pulse 71   Temp 98.2 F (36.8 C) (Oral)   Ht 5\' 4"  (1.626 m)   Wt 132 lb (59.9 kg)   LMP 06/17/2011   SpO2 99%   BMI 22.66 kg/m   Wt Readings from Last 3 Encounters:  04/01/18 132 lb (59.9 kg)  11/29/17 137 lb 6 oz (62.3 kg)  02/25/17 144 lb 6 oz (65.5 kg)    Physical Exam  Constitutional: She is oriented to person, place, and time. She appears well-developed and well-nourished. No distress.  HENT:  Head: Normocephalic and atraumatic.  Right Ear: Hearing normal.  Left Ear: Hearing normal.  Nose: Nose normal.  Eyes: Conjunctivae and lids are normal. Right eye exhibits no discharge. Left eye exhibits no discharge. No scleral icterus.  Cardiovascular: Normal rate, regular rhythm, normal heart sounds and intact distal pulses. Exam reveals no gallop and no friction rub.  No murmur heard. Pulmonary/Chest: Effort normal and breath sounds normal. No stridor. No respiratory distress. She has no wheezes. She has no rales. She exhibits no tenderness.  Musculoskeletal: Normal range of motion.  Neurological: She is  alert and oriented to person, place, and time.  Skin: Skin is warm, dry and intact. Capillary refill takes less than 2 seconds. No rash noted. She is not diaphoretic. No erythema. No pallor.  Psychiatric: She has a normal mood and affect. Her speech is normal and behavior is normal. Judgment and thought content normal. Cognition and memory are normal.  Nursing note and vitals reviewed.   Results for orders placed or performed in visit on 06/14/16  Microscopic Examination  Result Value Ref Range   WBC, UA 6-10 (A) 0 - 5 /hpf    RBC, UA 3-10 (A) 0 - 2 /hpf   Epithelial Cells (non renal) 0-10 0 - 10 /hpf   Bacteria, UA Moderate (A) None seen/Few  CBC with Differential/Platelet  Result Value Ref Range   WBC 5.4 3.4 - 10.8 x10E3/uL   RBC 3.73 (L) 3.77 - 5.28 x10E6/uL   Hemoglobin 13.3 11.1 - 15.9 g/dL   Hematocrit 38.1 34.0 - 46.6 %   MCV 102 (H) 79 - 97 fL   MCH 35.7 (H) 26.6 - 33.0 pg   MCHC 34.9 31.5 - 35.7 g/dL   RDW 13.7 12.3 - 15.4 %   Platelets 210 150 - 379 x10E3/uL   Neutrophils 55 Not Estab. %   Lymphs 34 Not Estab. %   Monocytes 9 Not Estab. %   Eos 1 Not Estab. %   Basos 1 Not Estab. %   Neutrophils Absolute 3.0 1.4 - 7.0 x10E3/uL   Lymphocytes Absolute 1.8 0.7 - 3.1 x10E3/uL   Monocytes Absolute 0.5 0.1 - 0.9 x10E3/uL   EOS (ABSOLUTE) 0.1 0.0 - 0.4 x10E3/uL   Basophils Absolute 0.0 0.0 - 0.2 x10E3/uL   Immature Granulocytes 0 Not Estab. %   Immature Grans (Abs) 0.0 0.0 - 0.1 x10E3/uL  Comprehensive metabolic panel  Result Value Ref Range   Glucose 95 65 - 99 mg/dL   BUN 9 6 - 24 mg/dL   Creatinine, Ser 0.70 0.57 - 1.00 mg/dL   GFR calc non Af Amer 99 >59 mL/min/1.73   GFR calc Af Amer 114 >59 mL/min/1.73   BUN/Creatinine Ratio 13 9 - 23   Sodium 135 134 - 144 mmol/L   Potassium 3.8 3.5 - 5.2 mmol/L   Chloride 96 96 - 106 mmol/L   CO2 22 18 - 29 mmol/L   Calcium 9.2 8.7 - 10.2 mg/dL   Total Protein 7.3 6.0 - 8.5 g/dL   Albumin 4.6 3.5 - 5.5 g/dL   Globulin, Total 2.7 1.5 - 4.5 g/dL   Albumin/Globulin Ratio 1.7 1.2 - 2.2   Bilirubin Total 0.2 0.0 - 1.2 mg/dL   Alkaline Phosphatase 95 39 - 117 IU/L   AST 19 0 - 40 IU/L   ALT 14 0 - 32 IU/L  TSH  Result Value Ref Range   TSH 1.120 0.450 - 4.500 uIU/mL  UA/M w/rflx Culture, Routine  Result Value Ref Range   Specific Gravity, UA 1.010 1.005 - 1.030   pH, UA 6.0 5.0 - 7.5   Color, UA Yellow Yellow   Appearance Ur Clear Clear   Leukocytes, UA 1+ (A) Negative   Protein, UA Negative Negative/Trace   Glucose, UA Negative Negative     Ketones, UA Negative Negative   RBC, UA 1+ (A) Negative   Bilirubin, UA Negative Negative   Urobilinogen, Ur 0.2 0.2 - 1.0 mg/dL   Nitrite, UA Negative Negative   Microscopic Examination See below:    Urinalysis Reflex Comment  Hepatitis C Antibody  Result Value Ref Range   Hep C Virus Ab <0.1 0.0 - 0.9 s/co ratio  HIV antibody  Result Value Ref Range   HIV Screen 4th Generation wRfx Non Reactive Non Reactive  Urine Culture, Routine  Result Value Ref Range   Urine Culture, Routine Final report    Organism ID, Bacteria No growth   Lipid Panel w/o Chol/HDL Ratio  Result Value Ref Range   Cholesterol, Total 225 (H) 100 - 199 mg/dL   Triglycerides 232 (H) 0 - 149 mg/dL   HDL 93 >39 mg/dL   VLDL Cholesterol Cal 46 (H) 5 - 40 mg/dL   LDL Calculated 86 0 - 99 mg/dL  Specimen status report  Result Value Ref Range   specimen status report Comment   IGP, Aptima HPV, rfx 16/18,45  Result Value Ref Range   DIAGNOSIS: Comment    Specimen adequacy: Comment    Clinician Provided ICD10 Comment    Performed by: Comment    QC reviewed by: Comment    Electronically signed by: Comment    PAP Smear Comment .    Note: Comment    Test Methodology Comment    HPV Aptima Positive (A) Negative   HPV Genotype 16 Negative Negative   HPV Genotype 18,45 Negative Negative      Assessment & Plan:   Problem List Items Addressed This Visit      Cardiovascular and Mediastinum   HTN (hypertension) - Primary    Newly diagnosed. Not under good control. Will start 10mg  lisinopril and recheck 1 month. Call with any concerns.       Relevant Medications   lisinopril (PRINIVIL,ZESTRIL) 10 MG tablet     Respiratory   COPD (chronic obstructive pulmonary disease) ([place])    Under good control. Continue current regimen. Call with any concerns. Refills given today.      Relevant Medications   albuterol (PROVENTIL HFA) 108 (90 Base) MCG/ACT inhaler     Digestive   GERD (gastroesophageal reflux  disease)    Under good control. Continue current regimen. Call with any concerns. Refills given today.        Other   Depression    Under good control. Continue current regimen. Continue to monitor. Call with any concerns. Refills given.       Relevant Medications   hydrOXYzine (ATARAX/VISTARIL) 25 MG tablet       Follow up plan: Return in about 1 month (around 04/29/2018) for Physical.

## 2018-04-01 NOTE — Assessment & Plan Note (Signed)
Under good control. Continue current regimen. Call with any concerns. Refills given today.

## 2018-04-01 NOTE — Assessment & Plan Note (Signed)
Newly diagnosed. Not under good control. Will start 10mg  lisinopril and recheck 1 month. Call with any concerns.

## 2018-04-02 ENCOUNTER — Other Ambulatory Visit: Payer: Self-pay | Admitting: Family Medicine

## 2018-04-03 NOTE — Telephone Encounter (Signed)
LOV 04/01/18 Dr. Wynetta Emery Last refill 01/13/18 15 g with 0 refill

## 2018-04-11 ENCOUNTER — Ambulatory Visit: Payer: Self-pay

## 2018-04-11 NOTE — Telephone Encounter (Signed)
Please check to see how she's doing. I do think she needs to go to the urgent care since we can't see her today as it's too late. I'll talk to her if she needs me to

## 2018-04-11 NOTE — Telephone Encounter (Signed)
Incoming call from patient with complaint of numbness in right arm  headaches neck pain and bad attitude. States she was placed on new medication for high blood pressure on 04/01/18.  Linsinopril 10 mg.  States appetite has decreased and experiencing diarrhea. Per protocol recommend that  Patient go to ER. Patient declines to go, nor to Urgent care. Provided care advice and recommended that she go to ER.  Patient still tends to declined.  Informed patient that I will be routing this phone encounter to office. patient voiced under understanding. Patient request a call from providers office.                   Reason for Disposition . [1] Numbness (i.e., loss of sensation) of the face, arm / hand, or leg / foot on one side of the body AND [2] sudden onset AND [3] present now  Answer Assessment - Initial Assessment Questions 1. SYMPTOM: "What is the main symptom you are concerned about?" (e.g., weakness, numbness)     2 days ago , getting worse 2. ONSET: "When did this start?" (minutes, hours, days; while sleeping)     numbness 3. LAST NORMAL: "When was the last time you were normal (no symptoms)?"      2 days ago 4. PATTERN "Does this come and go, or has it been constant since it started?"  "Is it present now?"     constant 5. CARDIAC SYMPTOMS: "Have you had any of the following symptoms: chest pain, difficulty breathing, palpitations?"     No,   6. NEUROLOGIC SYMPTOMS: "Have you had any of the following symptoms: headache, dizziness, vision loss, double vision, changes in speech, unsteady on your feet?"      Headache feet are tingling 7. OTHER SYMPTOMS: "Do you have any other symptoms?"     No  8. PREGNANCY: "Is there any chance you are pregnant?" "When was your last menstrual period?"     na  Protocols used: NEUROLOGIC DEFICIT-A-AH

## 2018-04-11 NOTE — Telephone Encounter (Signed)
Called patient, line was busy, will try again.

## 2018-04-14 NOTE — Telephone Encounter (Signed)
Called patient, line busy, will try again.

## 2018-04-14 NOTE — Telephone Encounter (Signed)
Routing to provider  

## 2018-05-07 ENCOUNTER — Encounter: Payer: Self-pay | Admitting: Family Medicine

## 2018-05-07 NOTE — Progress Notes (Deleted)
LMP 06/17/2011    Subjective:    Patient ID: Jill Poole, female    DOB: 07/17/1961, 57 y.o.   MRN: [id number]  HPI: Jill Poole is a 57 y.o. female presenting on 05/07/2018 for comprehensive medical examination. Current medical complaints include:{Blank single:19197::"none","***"}  She currently lives with: Menopausal Symptoms: {Blank single:19197::"yes","no"}  Depression Screen done today and results listed below:  Depression screen [facility] 2/9 04/01/2018 11/29/2017 02/25/2017 09/14/2016 05/14/2016  Decreased Interest 2 0 0 1 0  Down, Depressed, Hopeless 0 0 0 0 0  PHQ - 2 Score 2 0 0 1 0  Altered sleeping 0 3 - - -  Tired, decreased energy 0 0 - - -  Change in appetite 0 3 - - -  Feeling bad or failure about yourself  0 0 - - -  Trouble concentrating 1 0 - - -  Moving slowly or fidgety/restless 0 0 - - -  Suicidal thoughts 0 0 - - -  PHQ-9 Score 3 6 - - -  Difficult doing work/chores Somewhat difficult - - - -    The patient {has/does not YKDX:83382} a history of falls. I {did/did not:19850} complete a risk assessment for falls. A plan of care for falls {was/was not:19852} documented.   Past Medical History:  Past Medical History:  Diagnosis Date  . Asthma   . Back pain   . Concussion   . COPD (chronic obstructive pulmonary disease) ([place])   . DDD (degenerative disc disease), lumbar   . Depression   . Post traumatic stress disorder (PTSD)     Surgical History:  Past Surgical History:  Procedure Laterality Date  . TIBIA FRACTURE SURGERY    . TUBAL LIGATION    . WRIST SURGERY      Medications:  Current Outpatient Medications on File Prior to Visit  Medication Sig  . albuterol (PROVENTIL HFA) 108 (90 Base) MCG/ACT inhaler INHALE 2 PUFFS BY MOUTH EVERY 6 HOURS AS NEEDED FOR WHEEZE OR SHORTNESS OF BREATH  . aspirin 81 MG tablet Take 81 mg by mouth daily.  . cyclobenzaprine (FLEXERIL) 10 MG tablet Take 1 tablet (10 mg total) by mouth at bedtime.  Marland Kitchen FLUoxetine (PROZAC)  20 MG capsule Take 1 capsule (20 mg total) by mouth daily.  . Fluticasone-Salmeterol (ADVAIR) 250-50 MCG/DOSE AEPB Inhale 1 puff into the lungs 2 (two) times daily.  Marland Kitchen gabapentin (NEURONTIN) 300 MG capsule Take 1 capsule (300 mg total) by mouth 3 (three) times daily.  . hydrOXYzine (ATARAX/VISTARIL) 25 MG tablet Take 1 tablet (25 mg total) by mouth every 8 (eight) hours as needed.  Marland Kitchen lisinopril (PRINIVIL,ZESTRIL) 10 MG tablet Take 1 tablet (10 mg total) by mouth daily.  . naproxen (NAPROSYN) 500 MG tablet Take 1 tablet (500 mg total) by mouth 2 (two) times daily with a meal.  . omeprazole (PRILOSEC) 20 MG capsule Take 1 capsule (20 mg total) by mouth daily.  . traZODone (DESYREL) 100 MG tablet TAKE 1 TABLET(100 MG) BY MOUTH AT BEDTIME  . triamcinolone cream (KENALOG) 0.1 % APPLY TOPICALLY TO THE AFFECTED AREA AS DIRECTED   No current facility-administered medications on file prior to visit.     Allergies:  Allergies  Allergen Reactions  . Adenosine   . Tramadol Nausea And Vomiting  . Codeine Nausea And Vomiting and Other (See Comments)    Hot flashes, throwing up sick.  . Hydrocodone-Acetaminophen Nausea Only    Social History:  Social History   Socioeconomic History  . Marital status:  Single    Spouse name: Not on file  . Number of children: Not on file  . Years of education: Not on file  . Highest education level: Not on file  Occupational History  . Not on file  Social Needs  . Financial resource strain: Not on file  . Food insecurity:    Worry: Not on file    Inability: Not on file  . Transportation needs:    Medical: Not on file    Non-medical: Not on file  Tobacco Use  . Smoking status: Current Every Day Smoker    Packs/day: 0.50    Types: Cigarettes  . Smokeless tobacco: Never Used  Substance and Sexual Activity  . Alcohol use: Yes    Comment: once every two weeks  . Drug use: No  . Sexual activity: Never    Birth control/protection: Surgical  Lifestyle  .  Physical activity:    Days per week: Not on file    Minutes per session: Not on file  . Stress: Not on file  Relationships  . Social connections:    Talks on phone: Not on file    Gets together: Not on file    Attends religious service: Not on file    Active member of club or organization: Not on file    Attends meetings of clubs or organizations: Not on file    Relationship status: Not on file  . Intimate partner violence:    Fear of current or ex partner: Not on file    Emotionally abused: Not on file    Physically abused: Not on file    Forced sexual activity: Not on file  Other Topics Concern  . Not on file  Social History Narrative  . Not on file   Social History   Tobacco Use  Smoking Status Current Every Day Smoker  . Packs/day: 0.50  . Types: Cigarettes  Smokeless Tobacco Never Used   Social History   Substance and Sexual Activity  Alcohol Use Yes   Comment: once every two weeks    Family History:  Family History  Problem Relation Age of Onset  . Arthritis Mother   . Hypertension Mother   . Hypertension Sister   . Diabetes Sister   . Cancer Sister   . Heart disease Brother   . Asthma Brother   . Other Son        Lipoma  . Cancer Maternal Grandmother        Stomach    Past medical history, surgical history, medications, allergies, family history and social history reviewed with patient today and changes made to appropriate areas of the chart.   Review of Systems - {ros master:310782} All other ROS negative except what is listed above and in the HPI.      Objective:    LMP 06/17/2011   Wt Readings from Last 3 Encounters:  04/01/18 132 lb (59.9 kg)  11/29/17 137 lb 6 oz (62.3 kg)  02/25/17 144 lb 6 oz (65.5 kg)    Physical Exam  Results for orders placed or performed in visit on 06/14/16  Microscopic Examination  Result Value Ref Range   WBC, UA 6-10 (A) 0 - 5 /hpf   RBC, UA 3-10 (A) 0 - 2 /hpf   Epithelial Cells (non renal) 0-10 0 - 10  /hpf   Bacteria, UA Moderate (A) None seen/Few  CBC with Differential/Platelet  Result Value Ref Range   WBC 5.4 3.4 - 10.8 x10E3/uL  RBC 3.73 (L) 3.77 - 5.28 x10E6/uL   Hemoglobin 13.3 11.1 - 15.9 g/dL   Hematocrit 38.1 34.0 - 46.6 %   MCV 102 (H) 79 - 97 fL   MCH 35.7 (H) 26.6 - 33.0 pg   MCHC 34.9 31.5 - 35.7 g/dL   RDW 13.7 12.3 - 15.4 %   Platelets 210 150 - 379 x10E3/uL   Neutrophils 55 Not Estab. %   Lymphs 34 Not Estab. %   Monocytes 9 Not Estab. %   Eos 1 Not Estab. %   Basos 1 Not Estab. %   Neutrophils Absolute 3.0 1.4 - 7.0 x10E3/uL   Lymphocytes Absolute 1.8 0.7 - 3.1 x10E3/uL   Monocytes Absolute 0.5 0.1 - 0.9 x10E3/uL   EOS (ABSOLUTE) 0.1 0.0 - 0.4 x10E3/uL   Basophils Absolute 0.0 0.0 - 0.2 x10E3/uL   Immature Granulocytes 0 Not Estab. %   Immature Grans (Abs) 0.0 0.0 - 0.1 x10E3/uL  Comprehensive metabolic panel  Result Value Ref Range   Glucose 95 65 - 99 mg/dL   BUN 9 6 - 24 mg/dL   Creatinine, Ser 0.70 0.57 - 1.00 mg/dL   GFR calc non Af Amer 99 >59 mL/min/1.73   GFR calc Af Amer 114 >59 mL/min/1.73   BUN/Creatinine Ratio 13 9 - 23   Sodium 135 134 - 144 mmol/L   Potassium 3.8 3.5 - 5.2 mmol/L   Chloride 96 96 - 106 mmol/L   CO2 22 18 - 29 mmol/L   Calcium 9.2 8.7 - 10.2 mg/dL   Total Protein 7.3 6.0 - 8.5 g/dL   Albumin 4.6 3.5 - 5.5 g/dL   Globulin, Total 2.7 1.5 - 4.5 g/dL   Albumin/Globulin Ratio 1.7 1.2 - 2.2   Bilirubin Total 0.2 0.0 - 1.2 mg/dL   Alkaline Phosphatase 95 39 - 117 IU/L   AST 19 0 - 40 IU/L   ALT 14 0 - 32 IU/L  TSH  Result Value Ref Range   TSH 1.120 0.450 - 4.500 uIU/mL  UA/M w/rflx Culture, Routine  Result Value Ref Range   Specific Gravity, UA 1.010 1.005 - 1.030   pH, UA 6.0 5.0 - 7.5   Color, UA Yellow Yellow   Appearance Ur Clear Clear   Leukocytes, UA 1+ (A) Negative   Protein, UA Negative Negative/Trace   Glucose, UA Negative Negative   Ketones, UA Negative Negative   RBC, UA 1+ (A) Negative   Bilirubin, UA  Negative Negative   Urobilinogen, Ur 0.2 0.2 - 1.0 mg/dL   Nitrite, UA Negative Negative   Microscopic Examination See below:    Urinalysis Reflex Comment   Hepatitis C Antibody  Result Value Ref Range   Hep C Virus Ab <0.1 0.0 - 0.9 s/co ratio  HIV antibody  Result Value Ref Range   HIV Screen 4th Generation wRfx Non Reactive Non Reactive  Urine Culture, Routine  Result Value Ref Range   Urine Culture, Routine Final report    Organism ID, Bacteria No growth   Lipid Panel w/o Chol/HDL Ratio  Result Value Ref Range   Cholesterol, Total 225 (H) 100 - 199 mg/dL   Triglycerides 232 (H) 0 - 149 mg/dL   HDL 93 >39 mg/dL   VLDL Cholesterol Cal 46 (H) 5 - 40 mg/dL   LDL Calculated 86 0 - 99 mg/dL  Specimen status report  Result Value Ref Range   specimen status report Comment   IGP, Aptima HPV, rfx 16/18,45  Result Value Ref  Range   DIAGNOSIS: Comment    Specimen adequacy: Comment    Clinician Provided ICD10 Comment    Performed by: Comment    QC reviewed by: Comment    Electronically signed by: Comment    PAP Smear Comment .    Note: Comment    Test Methodology Comment    HPV Aptima Positive (A) Negative   HPV Genotype 16 Negative Negative   HPV Genotype 18,45 Negative Negative      Assessment & Plan:   Problem List Items Addressed This Visit    None    Visit Diagnoses    Routine general medical examination at a health care facility    -  Primary   Screening for cervical cancer           Follow up plan: No follow-ups on file.   LABORATORY TESTING:  - Pap smear: {Blank IWLNLG:92119::"ERD done","not applicable","up to date","done elsewhere"}  IMMUNIZATIONS:   - Tdap: Tetanus vaccination status reviewed: {tetanus status:315746}. - Influenza: {Blank single:19197::"Up to date","Administered today","Postponed to flu season","Refused","Given elsewhere"} - Pneumovax: {Blank single:19197::"Up to date","Administered today","Not applicable","Refused","Given elsewhere"} -  Prevnar: {Blank single:19197::"Up to date","Administered today","Not applicable","Refused","Given elsewhere"} - HPV: {Blank single:19197::"Up to date","Administered today","Not applicable","Refused","Given elsewhere"} - Zostavax vaccine: {Blank single:19197::"Up to date","Administered today","Not applicable","Refused","Given elsewhere"}  SCREENING: -Mammogram: {Blank single:19197::"Up to date","Ordered today","Not applicable","Refused","Done elsewhere"}  - Colonoscopy: {Blank single:19197::"Up to date","Ordered today","Not applicable","Refused","Done elsewhere"}  - Bone Density: {Blank single:19197::"Up to date","Ordered today","Not applicable","Refused","Done elsewhere"}  -Hearing Test: {Blank single:19197::"Up to date","Ordered today","Not applicable","Refused","Done elsewhere"}  -Spirometry: {Blank single:19197::"Up to date","Ordered today","Not applicable","Refused","Done elsewhere"}   PATIENT COUNSELING:   Advised to take 1 mg of folate supplement per day if capable of pregnancy.   Sexuality: Discussed sexually transmitted diseases, partner selection, use of condoms, avoidance of unintended pregnancy  and contraceptive alternatives.   Advised to avoid cigarette smoking.  I discussed with the patient that most people either abstain from alcohol or drink within safe limits (<=14/week and <=4 drinks/occasion for males, <=7/weeks and <= 3 drinks/occasion for females) and that the risk for alcohol disorders and other health effects rises proportionally with the number of drinks per week and how often a drinker exceeds daily limits.  Discussed cessation/primary prevention of drug use and availability of treatment for abuse.   Diet: Encouraged to adjust caloric intake to maintain  or achieve ideal body weight, to reduce intake of dietary saturated fat and total fat, to limit sodium intake by avoiding high sodium foods and not adding table salt, and to maintain adequate dietary potassium and  calcium preferably from fresh fruits, vegetables, and low-fat dairy products.    stressed the importance of regular exercise  Injury prevention: Discussed safety belts, safety helmets, smoke detector, smoking near bedding or upholstery.   Dental health: Discussed importance of regular tooth brushing, flossing, and dental visits.    NEXT PREVENTATIVE PHYSICAL DUE IN 1 YEAR. No follow-ups on file.

## 2018-06-06 ENCOUNTER — Encounter

## 2018-06-06 ENCOUNTER — Other Ambulatory Visit (HOSPITAL_COMMUNITY)
Admission: RE | Admit: 2018-06-06 | Discharge: 2018-06-06 | Disposition: A | Discharge status: Home / Self Care | Payer: Medicaid Other | Source: Ambulatory Visit | Attending: Family Medicine | Admitting: Family Medicine

## 2018-06-06 ENCOUNTER — Ambulatory Visit: Payer: Medicaid Other | Admitting: Family Medicine

## 2018-06-06 ENCOUNTER — Encounter: Payer: Self-pay | Admitting: Family Medicine

## 2018-06-06 ENCOUNTER — Other Ambulatory Visit: Payer: Self-pay

## 2018-06-06 VITALS — BP 133/88 | HR 94 | Temp 98.4°F | Ht 62.0 in | Wt 120.0 lb

## 2018-06-06 DIAGNOSIS — F331 Major depressive disorder, recurrent, moderate: Secondary | ICD-10-CM

## 2018-06-06 DIAGNOSIS — I10 Essential (primary) hypertension: Secondary | ICD-10-CM | POA: Diagnosis not present

## 2018-06-06 DIAGNOSIS — K219 Gastro-esophageal reflux disease without esophagitis: Secondary | ICD-10-CM

## 2018-06-06 DIAGNOSIS — J449 Chronic obstructive pulmonary disease, unspecified: Secondary | ICD-10-CM

## 2018-06-06 DIAGNOSIS — R8281 Pyuria: Secondary | ICD-10-CM

## 2018-06-06 DIAGNOSIS — Z0000 Encounter for general adult medical examination without abnormal findings: Secondary | ICD-10-CM | POA: Insufficient documentation

## 2018-06-06 DIAGNOSIS — Z124 Encounter for screening for malignant neoplasm of cervix: Secondary | ICD-10-CM | POA: Diagnosis not present

## 2018-06-06 LAB — UA/M W/RFLX CULTURE, ROUTINE
Glucose, UA: NEGATIVE
Ketones, UA: NEGATIVE
Nitrite, UA: POSITIVE — AB
Specific Gravity, UA: 1.015 (ref 1.005–1.030)
Urobilinogen, Ur: 0.2 mg/dL (ref 0.2–1.0)
pH, UA: 6 (ref 5.0–7.5)

## 2018-06-06 LAB — MICROSCOPIC EXAMINATION: WBC, UA: >30 /hpf — AB (ref 0–5)

## 2018-06-06 LAB — MICROALBUMIN, URINE WAIVED
Creatinine, Urine Waived: 200 mg/dL (ref 10–300)
Microalb, Ur Waived: 80 mg/L — ABNORMAL HIGH (ref 0–19)

## 2018-06-06 MED ORDER — OMEPRAZOLE 20 MG PO CPDR: 20.0000 mg | DELAYED_RELEASE_CAPSULE | Freq: Every day | ORAL | 1 refills | Status: DC

## 2018-06-06 MED ORDER — HYDROXYZINE HCL 25 MG PO TABS: 25.0000 mg | ORAL_TABLET | Freq: Three times a day (TID) | ORAL | 1 refills | Status: DC | PRN

## 2018-06-06 MED ORDER — FLUTICASONE-SALMETEROL 250-50 MCG/DOSE IN AEPB: 1.0000 | INHALATION_SPRAY | Freq: Two times a day (BID) | RESPIRATORY_TRACT | 3 refills | Status: DC

## 2018-06-06 MED ORDER — NAPROXEN 500 MG PO TABS: 500.0000 mg | ORAL_TABLET | Freq: Two times a day (BID) | ORAL | 1 refills | Status: DC

## 2018-06-06 MED ORDER — GABAPENTIN 300 MG PO CAPS: 300.0000 mg | ORAL_CAPSULE | Freq: Three times a day (TID) | ORAL | 1 refills | Status: DC

## 2018-06-06 MED ORDER — TRAZODONE HCL 100 MG PO TABS: ORAL_TABLET | ORAL | 1 refills | Status: DC

## 2018-06-06 MED ORDER — FLUOXETINE HCL 20 MG PO CAPS: 20.0000 mg | ORAL_CAPSULE | Freq: Every day | ORAL | 3 refills | Status: DC

## 2018-06-06 MED ORDER — CYCLOBENZAPRINE HCL 10 MG PO TABS: 10.0000 mg | ORAL_TABLET | Freq: Every day | ORAL | 0 refills | Status: DC

## 2018-06-06 NOTE — Progress Notes (Signed)
LMP 06/17/2011    Subjective:    Patient ID: Jill Poole, female    DOB: 23-Apr-1961, 57 y.o.   MRN: [id number]  HPI: Jill Poole is a 57 y.o. female  No chief complaint on file.   Relevant past medical, surgical, family and social history reviewed and updated as indicated. Interim medical history since our last visit reviewed. Allergies and medications reviewed and updated.  Review of Systems  Per HPI unless specifically indicated above     Objective:    LMP 06/17/2011   Wt Readings from Last 3 Encounters:  04/01/18 132 lb (59.9 kg)  11/29/17 137 lb 6 oz (62.3 kg)  02/25/17 144 lb 6 oz (65.5 kg)    Physical Exam  Results for orders placed or performed in visit on 06/14/16  Microscopic Examination  Result Value Ref Range   WBC, UA 6-10 (A) 0 - 5 /hpf   RBC, UA 3-10 (A) 0 - 2 /hpf   Epithelial Cells (non renal) 0-10 0 - 10 /hpf   Bacteria, UA Moderate (A) None seen/Few  CBC with Differential/Platelet  Result Value Ref Range   WBC 5.4 3.4 - 10.8 x10E3/uL   RBC 3.73 (L) 3.77 - 5.28 x10E6/uL   Hemoglobin 13.3 11.1 - 15.9 g/dL   Hematocrit 38.1 34.0 - 46.6 %   MCV 102 (H) 79 - 97 fL   MCH 35.7 (H) 26.6 - 33.0 pg   MCHC 34.9 31.5 - 35.7 g/dL   RDW 13.7 12.3 - 15.4 %   Platelets 210 150 - 379 x10E3/uL   Neutrophils 55 Not Estab. %   Lymphs 34 Not Estab. %   Monocytes 9 Not Estab. %   Eos 1 Not Estab. %   Basos 1 Not Estab. %   Neutrophils Absolute 3.0 1.4 - 7.0 x10E3/uL   Lymphocytes Absolute 1.8 0.7 - 3.1 x10E3/uL   Monocytes Absolute 0.5 0.1 - 0.9 x10E3/uL   EOS (ABSOLUTE) 0.1 0.0 - 0.4 x10E3/uL   Basophils Absolute 0.0 0.0 - 0.2 x10E3/uL   Immature Granulocytes 0 Not Estab. %   Immature Grans (Abs) 0.0 0.0 - 0.1 x10E3/uL  Comprehensive metabolic panel  Result Value Ref Range   Glucose 95 65 - 99 mg/dL   BUN 9 6 - 24 mg/dL   Creatinine, Ser 0.70 0.57 - 1.00 mg/dL   GFR calc non Af Amer 99 >59 mL/min/1.73   GFR calc Af Amer 114 >59 mL/min/1.73   BUN/Creatinine Ratio 13 9 - 23   Sodium 135 134 - 144 mmol/L   Potassium 3.8 3.5 - 5.2 mmol/L   Chloride 96 96 - 106 mmol/L   CO2 22 18 - 29 mmol/L   Calcium 9.2 8.7 - 10.2 mg/dL   Total Protein 7.3 6.0 - 8.5 g/dL   Albumin 4.6 3.5 - 5.5 g/dL   Globulin, Total 2.7 1.5 - 4.5 g/dL   Albumin/Globulin Ratio 1.7 1.2 - 2.2   Bilirubin Total 0.2 0.0 - 1.2 mg/dL   Alkaline Phosphatase 95 39 - 117 IU/L   AST 19 0 - 40 IU/L   ALT 14 0 - 32 IU/L  TSH  Result Value Ref Range   TSH 1.120 0.450 - 4.500 uIU/mL  UA/M w/rflx Culture, Routine  Result Value Ref Range   Specific Gravity, UA 1.010 1.005 - 1.030   pH, UA 6.0 5.0 - 7.5   Color, UA Yellow Yellow   Appearance Ur Clear Clear   Leukocytes, UA 1+ (A) Negative   Protein, UA  Negative Negative/Trace   Glucose, UA Negative Negative   Ketones, UA Negative Negative   RBC, UA 1+ (A) Negative   Bilirubin, UA Negative Negative   Urobilinogen, Ur 0.2 0.2 - 1.0 mg/dL   Nitrite, UA Negative Negative   Microscopic Examination See below:    Urinalysis Reflex Comment   Hepatitis C Antibody  Result Value Ref Range   Hep C Virus Ab <0.1 0.0 - 0.9 s/co ratio  HIV antibody  Result Value Ref Range   HIV Screen 4th Generation wRfx Non Reactive Non Reactive  Urine Culture, Routine  Result Value Ref Range   Urine Culture, Routine Final report    Organism ID, Bacteria No growth   Lipid Panel w/o Chol/HDL Ratio  Result Value Ref Range   Cholesterol, Total 225 (H) 100 - 199 mg/dL   Triglycerides 232 (H) 0 - 149 mg/dL   HDL 93 >39 mg/dL   VLDL Cholesterol Cal 46 (H) 5 - 40 mg/dL   LDL Calculated 86 0 - 99 mg/dL  Specimen status report  Result Value Ref Range   specimen status report Comment   IGP, Aptima HPV, rfx 16/18,45  Result Value Ref Range   DIAGNOSIS: Comment    Specimen adequacy: Comment    Clinician Provided ICD10 Comment    Performed by: Comment    QC reviewed by: Comment    Electronically signed by: Comment    PAP Smear Comment .      Note: Comment    Test Methodology Comment    HPV Aptima Positive (A) Negative   HPV Genotype 16 Negative Negative   HPV Genotype 18,45 Negative Negative      Assessment & Plan:   Problem List Items Addressed This Visit    None       Follow up plan: No follow-ups on file.

## 2018-06-06 NOTE — Assessment & Plan Note (Signed)
Under good control on current regimen. Continue current regimen. Continue to monitor. Call with any concerns. Refills given.   

## 2018-06-06 NOTE — Assessment & Plan Note (Signed)
Under good control off lisinopril. Will stay off of it and recheck when she's back in town.

## 2018-06-06 NOTE — Patient Instructions (Addendum)
Health Maintenance for Postmenopausal Women Menopause is a normal process in which your reproductive ability comes to an end. This process happens gradually over a span of months to years, usually between the ages of 83 and 41. Menopause is complete when you have missed 12 consecutive menstrual periods. It is important to talk with your health care provider about some of the most common conditions that affect postmenopausal women, such as heart disease, cancer, and bone loss (osteoporosis). Adopting a healthy lifestyle and getting preventive care can help to promote your health and wellness. Those actions can also lower your chances of developing some of these common conditions. What should I know about menopause? During menopause, you may experience a number of symptoms, such as:  Moderate-to-severe hot flashes.  Night sweats.  Decrease in sex drive.  Mood swings.  Headaches.  Tiredness.  Irritability.  Memory problems.  Insomnia.  Choosing to treat or not to treat menopausal changes is an individual decision that you make with your health care provider. What should I know about hormone replacement therapy and supplements? Hormone therapy products are effective for treating symptoms that are associated with menopause, such as hot flashes and night sweats. Hormone replacement carries certain risks, especially as you become older. If you are thinking about using estrogen or estrogen with progestin treatments, discuss the benefits and risks with your health care provider. What should I know about heart disease and stroke? Heart disease, heart attack, and stroke become more likely as you age. This may be due, in part, to the hormonal changes that your body experiences during menopause. These can affect how your body processes dietary fats, triglycerides, and cholesterol. Heart attack and stroke are both medical emergencies. There are many things that you can do to help prevent heart  disease and stroke:  Have your blood pressure checked at least every 1-2 years. High blood pressure causes heart disease and increases the risk of stroke.  If you are 12-82 years old, ask your health care provider if you should take aspirin to prevent a heart attack or a stroke.  Do not use any tobacco products, including cigarettes, chewing tobacco, or electronic cigarettes. If you need help quitting, ask your health care provider.  It is important to eat a healthy diet and maintain a healthy weight. ? Be sure to include plenty of vegetables, fruits, low-fat dairy products, and lean protein. ? Avoid eating foods that are high in solid fats, added sugars, or salt (sodium).  Get regular exercise. This is one of the most important things that you can do for your health. ? Try to exercise for at least 150 minutes each week. The type of exercise that you do should increase your heart rate and make you sweat. This is known as moderate-intensity exercise. ? Try to do strengthening exercises at least twice each week. Do these in addition to the moderate-intensity exercise.  Know your numbers.Ask your health care provider to check your cholesterol and your blood glucose. Continue to have your blood tested as directed by your health care provider.  What should I know about cancer screening? There are several types of cancer. Take the following steps to reduce your risk and to catch any cancer development as early as possible. Breast Cancer  Practice breast self-awareness. ? This means understanding how your breasts normally appear and feel. ? It also means doing regular breast self-exams. Let your health care provider know about any changes, no matter how small.  If you are 40  or older, have a clinician do a breast exam (clinical breast exam or CBE) every year. Depending on your age, family history, and medical history, it may be recommended that you also have a yearly breast X-ray  (mammogram).  If you have a family history of breast cancer, talk with your health care provider about genetic screening.  If you are at high risk for breast cancer, talk with your health care provider about having an MRI and a mammogram every year.  Breast cancer (BRCA) gene test is recommended for women who have family members with BRCA-related cancers. Results of the assessment will determine the need for genetic counseling and BRCA1 and for BRCA2 testing. BRCA-related cancers include these types: ? Breast. This occurs in males or females. ? Ovarian. ? Tubal. This may also be called fallopian tube cancer. ? Cancer of the abdominal or pelvic lining (peritoneal cancer). ? Prostate. ? Pancreatic.  Cervical, Uterine, and Ovarian Cancer Your health care provider may recommend that you be screened regularly for cancer of the pelvic organs. These include your ovaries, uterus, and vagina. This screening involves a pelvic exam, which includes checking for microscopic changes to the surface of your cervix (Pap test).  For women ages 21-65, health care providers may recommend a pelvic exam and a Pap test every three years. For women ages 72-65, they may recommend the Pap test and pelvic exam, combined with testing for human papilloma virus (HPV), every five years. Some types of HPV increase your risk of cervical cancer. Testing for HPV may also be done on women of any age who have unclear Pap test results.  Other health care providers may not recommend any screening for nonpregnant women who are considered low risk for pelvic cancer and have no symptoms. Ask your health care provider if a screening pelvic exam is right for you.  If you have had past treatment for cervical cancer or a condition that could lead to cancer, you need Pap tests and screening for cancer for at least 20 years after your treatment. If Pap tests have been discontinued for you, your risk factors (such as having a new sexual  partner) need to be reassessed to determine if you should start having screenings again. Some women have medical problems that increase the chance of getting cervical cancer. In these cases, your health care provider may recommend that you have screening and Pap tests more often.  If you have a family history of uterine cancer or ovarian cancer, talk with your health care provider about genetic screening.  If you have vaginal bleeding after reaching menopause, tell your health care provider.  There are currently no reliable tests available to screen for ovarian cancer.  Lung Cancer Lung cancer screening is recommended for adults 65-82 years old who are at high risk for lung cancer because of a history of smoking. A yearly low-dose CT scan of the lungs is recommended if you:  Currently smoke.  Have a history of at least 30 pack-years of smoking and you currently smoke or have quit within the past 15 years. A pack-year is smoking an average of one pack of cigarettes per day for one year.  Yearly screening should:  Continue until it has been 15 years since you quit.  Stop if you develop a health problem that would prevent you from having lung cancer treatment.  Colorectal Cancer  This type of cancer can be detected and can often be prevented.  Routine colorectal cancer screening usually begins at  age 30 and continues through age 22.  If you have risk factors for colon cancer, your health care provider may recommend that you be screened at an earlier age.  If you have a family history of colorectal cancer, talk with your health care provider about genetic screening.  Your health care provider may also recommend using home test kits to check for hidden blood in your stool.  A small camera at the end of a tube can be used to examine your colon directly (sigmoidoscopy or colonoscopy). This is done to check for the earliest forms of colorectal cancer.  Direct examination of the colon  should be repeated every 5-10 years until age 28. However, if early forms of precancerous polyps or small growths are found or if you have a family history or genetic risk for colorectal cancer, you may need to be screened more often.  Skin Cancer  Check your skin from head to toe regularly.  Monitor any moles. Be sure to tell your health care provider: ? About any new moles or changes in moles, especially if there is a change in a mole's shape or color. ? If you have a mole that is larger than the size of a pencil eraser.  If any of your family members has a history of skin cancer, especially at a young age, talk with your health care provider about genetic screening.  Always use sunscreen. Apply sunscreen liberally and repeatedly throughout the day.  Whenever you are outside, protect yourself by wearing long sleeves, pants, a wide-brimmed hat, and sunglasses.  What should I know about osteoporosis? Osteoporosis is a condition in which bone destruction happens more quickly than new bone creation. After menopause, you may be at an increased risk for osteoporosis. To help prevent osteoporosis or the bone fractures that can happen because of osteoporosis, the following is recommended:  If you are 62-69 years old, get at least 1,000 mg of calcium and at least 600 mg of vitamin D per day.  If you are older than age 60 but younger than age 68, get at least 1,200 mg of calcium and at least 600 mg of vitamin D per day.  If you are older than age 35, get at least 1,200 mg of calcium and at least 800 mg of vitamin D per day.  Smoking and excessive alcohol intake increase the risk of osteoporosis. Eat foods that are rich in calcium and vitamin D, and do weight-bearing exercises several times each week as directed by your health care provider. What should I know about how menopause affects my mental health? Depression may occur at any age, but it is more common as you become older. Common symptoms of  depression include:  Low or sad mood.  Changes in sleep patterns.  Changes in appetite or eating patterns.  Feeling an overall lack of motivation or enjoyment of activities that you previously enjoyed.  Frequent crying spells.  Talk with your health care provider if you think that you are experiencing depression. What should I know about immunizations? It is important that you get and maintain your immunizations. These include:  Tetanus, diphtheria, and pertussis (Tdap) booster vaccine.  Influenza every year before the flu season begins.  Pneumonia vaccine.  Shingles vaccine.  Your health care provider may also recommend other immunizations. This information is not intended to replace advice given to you by your health care provider. Make sure you discuss any questions you have with your health care provider. Document Released: 10/12/2005  Document Revised: 03/09/2016 Document Reviewed: 05/24/2015 [employer] Interactive Patient Education  2018 [employer]  
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
## 2018-06-07 LAB — CBC WITH DIFFERENTIAL/PLATELET
Basophils Absolute: 0.1 10*3/uL (ref 0.0–0.2)
Basos: 1 %
EOS (ABSOLUTE): 0 10*3/uL (ref 0.0–0.4)
Eos: 0 %
Hematocrit: 36.9 % (ref 34.0–46.6)
Hemoglobin: 13.4 g/dL (ref 11.1–15.9)
Immature Grans (Abs): 0 10*3/uL (ref 0.0–0.1)
Immature Granulocytes: 0 %
Lymphocytes Absolute: 1.4 10*3/uL (ref 0.7–3.1)
Lymphs: 19 %
MCH: 36.7 pg — ABNORMAL HIGH (ref 26.6–33.0)
MCHC: 36.3 g/dL — ABNORMAL HIGH (ref 31.5–35.7)
MCV: 101 fL — ABNORMAL HIGH (ref 79–97)
Monocytes Absolute: 0.6 10*3/uL (ref 0.1–0.9)
Monocytes: 8 %
Neutrophils Absolute: 5 10*3/uL (ref 1.4–7.0)
Neutrophils: 72 %
Platelets: 208 10*3/uL (ref 150–450)
RBC: 3.65 x10E6/uL — ABNORMAL LOW (ref 3.77–5.28)
RDW: 13.1 % (ref 12.3–15.4)
WBC: 7.1 10*3/uL (ref 3.4–10.8)

## 2018-06-07 LAB — LIPID PANEL W/O CHOL/HDL RATIO
Cholesterol, Total: 219 mg/dL — ABNORMAL HIGH (ref 100–199)
HDL: 143 mg/dL (ref >39)
LDL Calculated: 66 mg/dL (ref 0–99)
Triglycerides: 50 mg/dL (ref 0–149)
VLDL Cholesterol Cal: 10 mg/dL (ref 5–40)

## 2018-06-07 LAB — COMPREHENSIVE METABOLIC PANEL
ALT: 42 IU/L — ABNORMAL HIGH (ref 0–32)
AST: 71 IU/L — ABNORMAL HIGH (ref 0–40)
Albumin/Globulin Ratio: 1.7 (ref 1.2–2.2)
Albumin: 4.4 g/dL (ref 3.5–5.5)
Alkaline Phosphatase: 122 IU/L — ABNORMAL HIGH (ref 39–117)
BUN/Creatinine Ratio: 7 — ABNORMAL LOW (ref 9–23)
BUN: 5 mg/dL — ABNORMAL LOW (ref 6–24)
Bilirubin Total: 0.5 mg/dL (ref 0.0–1.2)
CO2: 22 mmol/L (ref 20–29)
Calcium: 9.6 mg/dL (ref 8.7–10.2)
Chloride: 90 mmol/L — ABNORMAL LOW (ref 96–106)
Creatinine, Ser: 0.73 mg/dL (ref 0.57–1.00)
GFR calc Af Amer: 107 mL/min/{1.73_m2} (ref >59)
GFR calc non Af Amer: 93 mL/min/{1.73_m2} (ref >59)
Globulin, Total: 2.6 g/dL (ref 1.5–4.5)
Glucose: 98 mg/dL (ref 65–99)
Potassium: 4.2 mmol/L (ref 3.5–5.2)
Sodium: 133 mmol/L — ABNORMAL LOW (ref 134–144)
Total Protein: 7 g/dL (ref 6.0–8.5)

## 2018-06-07 LAB — TSH: TSH: 1.29 u[IU]/mL (ref 0.450–4.500)

## 2018-06-09 LAB — URINE CULTURE

## 2018-06-10 ENCOUNTER — Telehealth: Payer: Self-pay | Admitting: Family Medicine

## 2018-06-10 MED ORDER — NITROFURANTOIN MONOHYD MACRO 100 MG PO CAPS: 100.0000 mg | ORAL_CAPSULE | Freq: Two times a day (BID) | ORAL | 0 refills | Status: DC

## 2018-06-10 NOTE — Telephone Encounter (Signed)
Tried to call patient, phone is busy, will try again.

## 2018-06-10 NOTE — Telephone Encounter (Signed)
Please let her know that she has a UTI, so I've sent a Rx to her pharmacy.

## 2018-06-11 NOTE — Telephone Encounter (Signed)
Patient notified

## 2018-06-13 LAB — CYTOLOGY - PAP
Diagnosis: NEGATIVE
HPV 16/18/45 genotyping: NEGATIVE
HPV: DETECTED — AB

## 2018-06-16 ENCOUNTER — Telehealth: Payer: Self-pay | Admitting: Family Medicine

## 2018-06-16 NOTE — Telephone Encounter (Signed)
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 Thanks!

## 2018-06-16 NOTE — Telephone Encounter (Signed)
Message relayed to patient. Verbalized understanding and denied questions.   

## 2018-07-04 ENCOUNTER — Other Ambulatory Visit: Payer: Self-pay | Admitting: Family Medicine

## 2018-07-04 MED ORDER — ALBUTEROL SULFATE HFA 108 (90 BASE) MCG/ACT IN AERS: INHALATION_SPRAY | RESPIRATORY_TRACT | 2 refills | Status: DC

## 2018-07-04 NOTE — Telephone Encounter (Signed)
Pt requests change in pharmacy- remainder of refill sent to new pharmacy

## 2018-07-04 NOTE — Telephone Encounter (Signed)
Copied from [place] [id number]. Topic: Quick Communication - Rx Refill/Question >> Jul 04, 2018 11:57 AM Poole, Jill Reap wrote: Medication: albuterol (PROVENTIL HFA) 108 (90 Base) MCG/ACT inhaler, FLUoxetine (PROZAC) 20 MG capsule, and  traZODone (DESYREL) 100 MG tablet   Has the patient contacted their pharmacy? yes   Preferred Pharmacy (with phone number or street name): [facility] [phone] - [place], [place] - [address] [phone] (Phone) [phone] (Fax)  Agent: Please be advised that RX refills may take up to 3 business days. We ask that you follow-up with your pharmacy.

## 2018-07-29 ENCOUNTER — Telehealth: Payer: Self-pay | Admitting: Family Medicine

## 2018-07-29 ENCOUNTER — Other Ambulatory Visit: Payer: Self-pay | Admitting: Family Medicine

## 2018-07-29 DIAGNOSIS — Z1231 Encounter for screening mammogram for malignant neoplasm of breast: Secondary | ICD-10-CM

## 2018-07-29 NOTE — Telephone Encounter (Signed)
Orders are already in

## 2018-07-29 NOTE — Telephone Encounter (Signed)
Copied from [place] [id number]. Topic: General - Other >> Jul 29, 2018  2:55 PM Jill Poole wrote: Reason for CRM: pt called in stating that she received a call from [facility] stating that they need PCP to sign off on paperwork so that they can schedule pt.   Please assist.

## 2018-08-08 ENCOUNTER — Ambulatory Visit
Admission: RE | Admit: 2018-08-08 | Discharge: 2018-08-08 | Disposition: A | Discharge status: Home / Self Care | Payer: Medicaid Other | Source: Ambulatory Visit | Attending: Family Medicine | Admitting: Family Medicine

## 2018-08-08 ENCOUNTER — Encounter: Payer: Self-pay | Admitting: Family Medicine

## 2018-08-08 DIAGNOSIS — Z1231 Encounter for screening mammogram for malignant neoplasm of breast: Secondary | ICD-10-CM

## 2018-08-08 DIAGNOSIS — R928 Other abnormal and inconclusive findings on diagnostic imaging of breast: Secondary | ICD-10-CM | POA: Diagnosis not present

## 2018-09-16 ENCOUNTER — Ambulatory Visit: Payer: Medicaid Other | Admitting: Family Medicine

## 2018-09-16 ENCOUNTER — Ambulatory Visit: Payer: Self-pay

## 2018-09-16 ENCOUNTER — Encounter: Payer: Self-pay | Admitting: Family Medicine

## 2018-09-16 VITALS — BP 122/72 | HR 80 | Temp 98.2°F | Ht 62.0 in | Wt 115.2 lb

## 2018-09-16 DIAGNOSIS — L089 Local infection of the skin and subcutaneous tissue, unspecified: Secondary | ICD-10-CM | POA: Diagnosis not present

## 2018-09-16 DIAGNOSIS — L723 Sebaceous cyst: Secondary | ICD-10-CM

## 2018-09-16 DIAGNOSIS — I10 Essential (primary) hypertension: Secondary | ICD-10-CM

## 2018-09-16 DIAGNOSIS — R220 Localized swelling, mass and lump, head: Secondary | ICD-10-CM | POA: Diagnosis not present

## 2018-09-16 MED ORDER — LISINOPRIL 10 MG PO TABS: 10.0000 mg | ORAL_TABLET | Freq: Every day | ORAL | 3 refills | Status: DC

## 2018-09-16 MED ORDER — SULFAMETHOXAZOLE-TRIMETHOPRIM 800-160 MG PO TABS: 1.0000 | ORAL_TABLET | Freq: Two times a day (BID) | ORAL | 0 refills | Status: DC

## 2018-09-16 NOTE — Telephone Encounter (Signed)
Spoke with pt and she stated she was waiting on a ride and was unsure of how long it would take them to get there, she will go to urgent care instead but is appreciative that she was offered an appt.

## 2018-09-16 NOTE — Telephone Encounter (Signed)
Seen today. 

## 2018-09-16 NOTE — Progress Notes (Signed)
BP 122/72   Pulse 80   Temp 98.2 F (36.8 C) (Oral)   Ht 5\' 2"  (1.575 m)   Wt 115 lb 3.2 oz (52.3 kg)   LMP 06/17/2011   SpO2 100%   BMI 21.07 kg/m    Subjective:    Patient ID: Jill Poole, female    DOB: [birth year], 58 y.o.   MRN: [id number]  HPI: Jill Poole is a 58 y.o. female  Chief Complaint  Patient presents with  . Hypertension    pt states her BP last night was 200/112. States she went to urgent care today and was given a Clonodine in office. States she does not know the dose  . Cyst    pt states she has a knot on her neck that she first noticed 4 to 5 days ago    HYPERTENSION- went to the urgent care for a headache today and was found to have a BP of 172/80 Hypertension status: better  Satisfied with current treatment? no Duration of hypertension: chronic BP monitoring frequency:  not checking BP range: 170s BP medication side effects:  no Medication compliance: fair compliance Previous BP meds: lisinopril Aspirin: no Recurrent headaches: yes Visual changes: no Palpitations: no Dyspnea: no Chest pain: no Lower extremity edema: no Dizzy/lightheaded: no   LUMP Duration: 4 days Location: back of her head Onset: sudden Painful: yes Discomfort: yes Status:  bigger Trauma: no Redness: yes Bruising: no Recent infection: no Swollen lymph nodes: yes Requesting removal: no History of cancer: no Family history of cancer: no History of the same: no   Relevant past medical, surgical, family and social history reviewed and updated as indicated. Interim medical history since our last visit reviewed. Allergies and medications reviewed and updated.  Review of Systems  Constitutional: Negative.   Respiratory: Negative.   Cardiovascular: Negative.   Skin: Positive for wound. Negative for color change, pallor and rash.  Psychiatric/Behavioral: Negative.     Per HPI unless specifically indicated above     Objective:    BP 122/72   Pulse 80    Temp 98.2 F (36.8 C) (Oral)   Ht 5\' 2"  (1.575 m)   Wt 115 lb 3.2 oz (52.3 kg)   LMP 06/17/2011   SpO2 100%   BMI 21.07 kg/m   Wt Readings from Last 3 Encounters:  09/16/18 115 lb 3.2 oz (52.3 kg)  06/06/18 120 lb (54.4 kg)  04/01/18 132 lb (59.9 kg)    Physical Exam Vitals signs and nursing note reviewed.  Constitutional:      General: She is not in acute distress.    Appearance: Normal appearance. She is not ill-appearing, toxic-appearing or diaphoretic.  HENT:     Head: Normocephalic and atraumatic.     Right Ear: External ear normal.     Left Ear: External ear normal.     Nose: Nose normal.     Mouth/Throat:     Mouth: Mucous membranes are moist.     Pharynx: Oropharynx is clear.  Eyes:     General: No scleral icterus.       Right eye: No discharge.        Left eye: No discharge.     Extraocular Movements: Extraocular movements intact.     Conjunctiva/sclera: Conjunctivae normal.     Pupils: Pupils are equal, round, and reactive to light.  Neck:     Musculoskeletal: Normal range of motion and neck supple.  Cardiovascular:     Rate and Rhythm: Normal  rate and regular rhythm.     Pulses: Normal pulses.     Heart sounds: Normal heart sounds. No murmur. No friction rub. No gallop.   Pulmonary:     Effort: Pulmonary effort is normal. No respiratory distress.     Breath sounds: Normal breath sounds. No stridor. No wheezing, rhonchi or rales.  Chest:     Chest wall: No tenderness.  Musculoskeletal: Normal range of motion.  Skin:    General: Skin is warm and dry.     Capillary Refill: Capillary refill takes less than 2 seconds.     Coloration: Skin is not jaundiced or pale.     Findings: No bruising, erythema, lesion or rash.     Comments: Large (2.5 inch) lump, non-fluctuant, erythematous and warm on back of head  Neurological:     General: No focal deficit present.     Mental Status: She is alert and oriented to person, place, and time. Mental status is at baseline.   Psychiatric:        Mood and Affect: Mood normal.        Behavior: Behavior normal.        Thought Content: Thought content normal.        Judgment: Judgment normal.     Results for orders placed or performed in visit on 06/06/18  Urine Culture  Result Value Ref Range   Urine Culture, Routine Final report (A)    Organism ID, Bacteria Klebsiella pneumoniae (A)    Antimicrobial Susceptibility Comment   Microscopic Examination  Result Value Ref Range   WBC, UA >30 (A) 0 - 5 /hpf   RBC, UA 0-2 0 - 2 /hpf   Epithelial Cells (non renal) 0-10 0 - 10 /hpf   Bacteria, UA Moderate (A) None seen/Few  CBC with Differential/Platelet  Result Value Ref Range   WBC 7.1 3.4 - 10.8 x10E3/uL   RBC 3.65 (L) 3.77 - 5.28 x10E6/uL   Hemoglobin 13.4 11.1 - 15.9 g/dL   Hematocrit 36.9 34.0 - 46.6 %   MCV 101 (H) 79 - 97 fL   MCH 36.7 (H) 26.6 - 33.0 pg   MCHC 36.3 (H) 31.5 - 35.7 g/dL   RDW 13.1 12.3 - 15.4 %   Platelets 208 150 - 450 x10E3/uL   Neutrophils 72 Not Estab. %   Lymphs 19 Not Estab. %   Monocytes 8 Not Estab. %   Eos 0 Not Estab. %   Basos 1 Not Estab. %   Neutrophils Absolute 5.0 1.4 - 7.0 x10E3/uL   Lymphocytes Absolute 1.4 0.7 - 3.1 x10E3/uL   Monocytes Absolute 0.6 0.1 - 0.9 x10E3/uL   EOS (ABSOLUTE) 0.0 0.0 - 0.4 x10E3/uL   Basophils Absolute 0.1 0.0 - 0.2 x10E3/uL   Immature Granulocytes 0 Not Estab. %   Immature Grans (Abs) 0.0 0.0 - 0.1 x10E3/uL  Comprehensive metabolic panel  Result Value Ref Range   Glucose 98 65 - 99 mg/dL   BUN 5 (L) 6 - 24 mg/dL   Creatinine, Ser 0.73 0.57 - 1.00 mg/dL   GFR calc non Af Amer 93 >59 mL/min/1.73   GFR calc Af Amer 107 >59 mL/min/1.73   BUN/Creatinine Ratio 7 (L) 9 - 23   Sodium 133 (L) 134 - 144 mmol/L   Potassium 4.2 3.5 - 5.2 mmol/L   Chloride 90 (L) 96 - 106 mmol/L   CO2 22 20 - 29 mmol/L   Calcium 9.6 8.7 - 10.2 mg/dL   Total Protein  7.0 6.0 - 8.5 g/dL   Albumin 4.4 3.5 - 5.5 g/dL   Globulin, Total 2.6 1.5 - 4.5 g/dL    Albumin/Globulin Ratio 1.7 1.2 - 2.2   Bilirubin Total 0.5 0.0 - 1.2 mg/dL   Alkaline Phosphatase 122 (H) 39 - 117 IU/L   AST 71 (H) 0 - 40 IU/L   ALT 42 (H) 0 - 32 IU/L  Lipid Panel w/o Chol/HDL Ratio  Result Value Ref Range   Cholesterol, Total 219 (H) 100 - 199 mg/dL   Triglycerides 50 0 - 149 mg/dL   HDL 143 >39 mg/dL   VLDL Cholesterol Cal 10 5 - 40 mg/dL   LDL Calculated 66 0 - 99 mg/dL  Microalbumin, Urine Waived  Result Value Ref Range   Microalb, Ur Waived 80 (H) 0 - 19 mg/L   Creatinine, Urine Waived 200 10 - 300 mg/dL   Microalb/Creat Ratio 30-300 (H) <30 mg/g  TSH  Result Value Ref Range   TSH 1.290 0.450 - 4.500 uIU/mL  UA/M w/rflx Culture, Routine  Result Value Ref Range   Specific Gravity, UA 1.015 1.005 - 1.030   pH, UA 6.0 5.0 - 7.5   Color, UA Yellow Yellow   Appearance Ur Cloudy (A) Clear   Leukocytes, UA 1+ (A) Negative   Protein, UA Trace (A) Negative/Trace   Glucose, UA Negative Negative   Ketones, UA Negative Negative   RBC, UA 1+ (A) Negative   Urobilinogen, Ur 0.2 0.2 - 1.0 mg/dL   Nitrite, UA Positive (A) Negative   Microscopic Examination See below:   Cytology - PAP  Result Value Ref Range   Adequacy      Satisfactory for evaluation  endocervical/transformation zone component PRESENT.   Diagnosis      NEGATIVE FOR INTRAEPITHELIAL LESIONS OR MALIGNANCY.   HPV 16/18/45 genotyping NEGATIVE for HPV 16 & 18/45    HPV DETECTED (A)    Material Submitted CervicoVaginal Pap [ThinPrep Imaged]       Assessment & Plan:   Problem List Items Addressed This Visit      Cardiovascular and Mediastinum   HTN (hypertension) - Primary    BP quite elevated at urgent care and given clonidine due to headache. Will restart her lisinopril and recheck 2 weeks. Call with any concerns.       Relevant Medications   lisinopril (PRINIVIL,ZESTRIL) 10 MG tablet    Other Visit Diagnoses    Infected sebaceous cyst       Will treat with bactrim and recheck 2 weeks, if  still hurting, will get her into see surgery.   Relevant Medications   sulfamethoxazole-trimethoprim (BACTRIM DS,SEPTRA DS) 800-160 MG tablet       Follow up plan: Return in about 2 weeks (around 09/30/2018) for BP and cyst follow up.

## 2018-09-16 NOTE — Telephone Encounter (Signed)
Perfect. Thanks.

## 2018-09-16 NOTE — Telephone Encounter (Signed)
Pt called form [facility] with BP 172/80. Pt was given po Clonidine for HTN. Pt stated that [facility] stated that she needed to see her PCP today. Called office and updated [place]. [place] booked pt  appt for 1:45 today. Pt continues with severe headache and palm sized knot to the back of her head and "knots" down the side of her neck. Pt still at [facility]. Pt stated that they are going to recheck her BP after taking the clonidine. Pt has not been discharged form [facility]. Pt stated that she will be at the appt.  Reason for Disposition . [9] Systolic BP  >= 735 OR Diastolic >= 329 AND [9] cardiac or neurologic symptoms (e.g., chest pain, difficulty breathing, unsteady gait, blurred vision)  Answer Assessment - Initial Assessment Questions 1. BLOOD PRESSURE: "What is the blood pressure?" "Did you take at least two measurements 5 minutes apart?"     At [facility] 172/80 clonidine was given at [facility] 1135 2. ONSET: "When did you take your blood pressure?"     10 minutes  3. HOW: "How did you obtain the blood pressure?" (e.g., visiting nurse, automatic home BP monitor)    At [facility] 4. HISTORY: "Do you have a history of high blood pressure?"     Yes  5. MEDICATIONS: "Are you taking any medications for blood pressure?" "Have you missed any doses recently?"     no 6. OTHER SYMPTOMS: "Do you have any symptoms?" (e.g., headache, chest pain, blurred vision, difficulty breathing, weakness)     Severe headache, nodes side of neck and big knot on back head size of palm  7. PREGNANCY: "Is there any chance you are pregnant?" "When was your last menstrual period?"     n/a  Protocols used: HIGH BLOOD PRESSURE-A-AH

## 2018-09-16 NOTE — Telephone Encounter (Signed)
I can see her now, 1:45 or 3:15

## 2018-09-16 NOTE — Assessment & Plan Note (Addendum)
BP quite elevated at urgent care and given clonidine due to headache. Will restart her lisinopril and recheck 2 weeks. Call with any concerns.

## 2018-09-16 NOTE — Telephone Encounter (Signed)
Pt c/o headache for 5 days. Pain is constant and severe. Pt stated that headache is worse at night. Pt stated that she thinks she has a fever (no thermometer), denies eye pain, sore throat or cold symptoms.  Pt stated that she has knot on back of her head and down her neck. Pt doesn't know what the knot look like. Pt stated she has not hit her head and thinks it could be bug bites. Pt doesn't have a h/o migraines. Pt also c/o earache to left ear. Care advice given and pt verbalized understanding. Pt advised to go to [facility]. Pt verbalized agreement.  Reason for Disposition . [1] SEVERE headache (e.g., excruciating) AND [2] not improved after 2 hours of pain medicine  Answer Assessment - Initial Assessment Questions 1. LOCATION: "Where does it hurt?"      Left side behind ear down the neck 2. ONSET: "When did the headache start?" (Minutes, hours or days)      5 days ago 3. PATTERN: "Does the pain come and go, or has it been constant since it started?"     Constant but worse at night 4. SEVERITY: "How bad is the pain?" and "What does it keep you from doing?"  (e.g., Scale 1-10; mild, moderate, or severe)   - MILD (1-3): doesn't interfere with normal activities    - MODERATE (4-7): interferes with normal activities or awakens from sleep    - SEVERE (8-10): excruciating pain, unable to do any normal activities        severe 5. RECURRENT SYMPTOM: "Have you ever had headaches before?" If so, ask: "When was the last time?" and "What happened that time?"      Yes-4 months- went away on its own 6. CAUSE: "What do you think is causing the headache?"     Knot back of head down neck-  7. MIGRAINE: "Have you been diagnosed with migraine headaches?" If so, ask: "Is this headache similar?"      no 8. HEAD INJURY: "Has there been any recent injury to the head?"      no 9. OTHER SYMPTOMS: "Do you have any other symptoms?" (fever, stiff neck, eye pain, sore throat, cold symptoms)     Earache to left ear 10.  PREGNANCY: "Is there any chance you are pregnant?" "When was your last menstrual period?"       n/a  Protocols used: HEADACHE-A-AH

## 2018-09-25 ENCOUNTER — Other Ambulatory Visit (HOSPITAL_COMMUNITY)
Admission: RE | Admit: 2018-09-25 | Discharge: 2018-09-25 | Disposition: A | Discharge status: Home / Self Care | Payer: Medicaid Other | Source: Ambulatory Visit | Attending: Family Medicine | Admitting: Family Medicine

## 2018-09-25 ENCOUNTER — Encounter: Payer: Self-pay | Admitting: Family Medicine

## 2018-09-25 ENCOUNTER — Ambulatory Visit: Payer: Medicaid Other | Admitting: Family Medicine

## 2018-09-25 VITALS — BP 130/80 | HR 66 | Temp 98.6°F | Ht 64.0 in | Wt 116.4 lb

## 2018-09-25 DIAGNOSIS — L089 Local infection of the skin and subcutaneous tissue, unspecified: Secondary | ICD-10-CM | POA: Diagnosis not present

## 2018-09-25 DIAGNOSIS — I10 Essential (primary) hypertension: Secondary | ICD-10-CM

## 2018-09-25 DIAGNOSIS — I96 Gangrene, not elsewhere classified: Secondary | ICD-10-CM | POA: Diagnosis not present

## 2018-09-25 DIAGNOSIS — L723 Sebaceous cyst: Secondary | ICD-10-CM

## 2018-09-25 DIAGNOSIS — L819 Disorder of pigmentation, unspecified: Secondary | ICD-10-CM | POA: Diagnosis not present

## 2018-09-25 MED ORDER — LISINOPRIL 10 MG PO TABS: 10.0000 mg | ORAL_TABLET | Freq: Every day | ORAL | 1 refills | Status: DC

## 2018-09-25 NOTE — Progress Notes (Signed)
BP 130/80 (BP Location: Left Arm, Patient Position: Sitting, Cuff Size: Normal)   Pulse 66   Temp 98.6 F (37 C) (Oral)   Ht 5\' 4"  (1.626 m)   Wt 116 lb 6.4 oz (52.8 kg)   LMP 06/17/2011   SpO2 100%   BMI 19.98 kg/m    Subjective:    Patient ID: Jill Poole, female    DOB: 1961-08-09, 58 y.o.   MRN: [id number]  HPI: Jill Poole is a 58 y.o. female  Chief Complaint  Patient presents with  . Cyst    Cyst ruptured yesterday. Still draining.   Marland Kitchen Hypertension   ABSCESS Duration: 3 weeks Location: back of her head Pain:  yes- better Quality:  Aching  Severity: moderate Redness:  yes Swelling:  yes Warmth:  yes Oozing:  yes Pus:  yes Treatments attempted:antibiotics Past similar infections:  no Past MRSA skin infections:  no History of trauma in area:  no Fevers:  no Nausea/vomiting:  no  HYPERTENSION Hypertension status: better  Satisfied with current treatment? yes Duration of hypertension: chronic BP monitoring frequency:  not checking BP range:  BP medication side effects:  no Medication compliance: good compliance Previous BP meds: lisinopril Aspirin: no Recurrent headaches: no Visual changes: no Palpitations: no Dyspnea: no Chest pain: no Lower extremity edema: no Dizzy/lightheaded: no  Relevant past medical, surgical, family and social history reviewed and updated as indicated. Interim medical history since our last visit reviewed. Allergies and medications reviewed and updated.  Review of Systems  Constitutional: Negative.   Respiratory: Negative.   Cardiovascular: Negative.   Skin: Positive for color change and wound. Negative for pallor and rash.  Psychiatric/Behavioral: Negative.     Per HPI unless specifically indicated above     Objective:    BP 130/80 (BP Location: Left Arm, Patient Position: Sitting, Cuff Size: Normal)   Pulse 66   Temp 98.6 F (37 C) (Oral)   Ht 5\' 4"  (1.626 m)   Wt 116 lb 6.4 oz (52.8 kg)   LMP  06/17/2011   SpO2 100%   BMI 19.98 kg/m   Wt Readings from Last 3 Encounters:  09/25/18 116 lb 6.4 oz (52.8 kg)  09/16/18 115 lb 3.2 oz (52.3 kg)  06/06/18 120 lb (54.4 kg)    Physical Exam Vitals signs and nursing note reviewed.  Constitutional:      General: She is not in acute distress.    Appearance: Normal appearance. She is not ill-appearing, toxic-appearing or diaphoretic.  HENT:     Head: Normocephalic and atraumatic.     Right Ear: External ear normal.     Left Ear: External ear normal.     Nose: Nose normal.     Mouth/Throat:     Mouth: Mucous membranes are moist.     Pharynx: Oropharynx is clear.  Eyes:     General: No scleral icterus.       Right eye: No discharge.        Left eye: No discharge.     Extraocular Movements: Extraocular movements intact.     Conjunctiva/sclera: Conjunctivae normal.     Pupils: Pupils are equal, round, and reactive to light.  Neck:     Musculoskeletal: Normal range of motion and neck supple.  Cardiovascular:     Rate and Rhythm: Normal rate and regular rhythm.     Pulses: Normal pulses.     Heart sounds: Normal heart sounds. No murmur. No friction rub. No gallop.   Pulmonary:  Effort: Pulmonary effort is normal. No respiratory distress.     Breath sounds: Normal breath sounds. No stridor. No wheezing, rhonchi or rales.  Chest:     Chest wall: No tenderness.  Musculoskeletal: Normal range of motion.  Skin:    General: Skin is warm and dry.     Capillary Refill: Capillary refill takes less than 2 seconds.     Coloration: Skin is not jaundiced or pale.     Findings: No bruising, erythema, lesion or rash.       Neurological:     General: No focal deficit present.     Mental Status: She is alert and oriented to person, place, and time. Mental status is at baseline.  Psychiatric:        Mood and Affect: Mood normal.        Behavior: Behavior normal.        Thought Content: Thought content normal.        Judgment: Judgment  normal.     Results for orders placed or performed in visit on 06/06/18  Urine Culture  Result Value Ref Range   Urine Culture, Routine Final report (A)    Organism ID, Bacteria Klebsiella pneumoniae (A)    Antimicrobial Susceptibility Comment   Microscopic Examination  Result Value Ref Range   WBC, UA >30 (A) 0 - 5 /hpf   RBC, UA 0-2 0 - 2 /hpf   Epithelial Cells (non renal) 0-10 0 - 10 /hpf   Bacteria, UA Moderate (A) None seen/Few  CBC with Differential/Platelet  Result Value Ref Range   WBC 7.1 3.4 - 10.8 x10E3/uL   RBC 3.65 (L) 3.77 - 5.28 x10E6/uL   Hemoglobin 13.4 11.1 - 15.9 g/dL   Hematocrit 36.9 34.0 - 46.6 %   MCV 101 (H) 79 - 97 fL   MCH 36.7 (H) 26.6 - 33.0 pg   MCHC 36.3 (H) 31.5 - 35.7 g/dL   RDW 13.1 12.3 - 15.4 %   Platelets 208 150 - 450 x10E3/uL   Neutrophils 72 Not Estab. %   Lymphs 19 Not Estab. %   Monocytes 8 Not Estab. %   Eos 0 Not Estab. %   Basos 1 Not Estab. %   Neutrophils Absolute 5.0 1.4 - 7.0 x10E3/uL   Lymphocytes Absolute 1.4 0.7 - 3.1 x10E3/uL   Monocytes Absolute 0.6 0.1 - 0.9 x10E3/uL   EOS (ABSOLUTE) 0.0 0.0 - 0.4 x10E3/uL   Basophils Absolute 0.1 0.0 - 0.2 x10E3/uL   Immature Granulocytes 0 Not Estab. %   Immature Grans (Abs) 0.0 0.0 - 0.1 x10E3/uL  Comprehensive metabolic panel  Result Value Ref Range   Glucose 98 65 - 99 mg/dL   BUN 5 (L) 6 - 24 mg/dL   Creatinine, Ser 0.73 0.57 - 1.00 mg/dL   GFR calc non Af Amer 93 >59 mL/min/1.73   GFR calc Af Amer 107 >59 mL/min/1.73   BUN/Creatinine Ratio 7 (L) 9 - 23   Sodium 133 (L) 134 - 144 mmol/L   Potassium 4.2 3.5 - 5.2 mmol/L   Chloride 90 (L) 96 - 106 mmol/L   CO2 22 20 - 29 mmol/L   Calcium 9.6 8.7 - 10.2 mg/dL   Total Protein 7.0 6.0 - 8.5 g/dL   Albumin 4.4 3.5 - 5.5 g/dL   Globulin, Total 2.6 1.5 - 4.5 g/dL   Albumin/Globulin Ratio 1.7 1.2 - 2.2   Bilirubin Total 0.5 0.0 - 1.2 mg/dL   Alkaline Phosphatase 122 (H) 39 -  117 IU/L   AST 71 (H) 0 - 40 IU/L   ALT 42 (H) 0  - 32 IU/L  Lipid Panel w/o Chol/HDL Ratio  Result Value Ref Range   Cholesterol, Total 219 (H) 100 - 199 mg/dL   Triglycerides 50 0 - 149 mg/dL   HDL 143 >39 mg/dL   VLDL Cholesterol Cal 10 5 - 40 mg/dL   LDL Calculated 66 0 - 99 mg/dL  Microalbumin, Urine Waived  Result Value Ref Range   Microalb, Ur Waived 80 (H) 0 - 19 mg/L   Creatinine, Urine Waived 200 10 - 300 mg/dL   Microalb/Creat Ratio 30-300 (H) <30 mg/g  TSH  Result Value Ref Range   TSH 1.290 0.450 - 4.500 uIU/mL  UA/M w/rflx Culture, Routine  Result Value Ref Range   Specific Gravity, UA 1.015 1.005 - 1.030   pH, UA 6.0 5.0 - 7.5   Color, UA Yellow Yellow   Appearance Ur Cloudy (A) Clear   Leukocytes, UA 1+ (A) Negative   Protein, UA Trace (A) Negative/Trace   Glucose, UA Negative Negative   Ketones, UA Negative Negative   RBC, UA 1+ (A) Negative   Urobilinogen, Ur 0.2 0.2 - 1.0 mg/dL   Nitrite, UA Positive (A) Negative   Microscopic Examination See below:   Cytology - PAP  Result Value Ref Range   Adequacy      Satisfactory for evaluation  endocervical/transformation zone component PRESENT.   Diagnosis      NEGATIVE FOR INTRAEPITHELIAL LESIONS OR MALIGNANCY.   HPV 16/18/45 genotyping NEGATIVE for HPV 16 & 18/45    HPV DETECTED (A)    Material Submitted CervicoVaginal Pap [ThinPrep Imaged]       Assessment & Plan:   Problem List Items Addressed This Visit      Cardiovascular and Mediastinum   HTN (hypertension)    Much better on her lisinopril. Continue lisinopril. Continue to monitor. Call with any concerns.       Relevant Medications   lisinopril (PRINIVIL,ZESTRIL) 10 MG tablet   Other Relevant Orders   Basic metabolic panel    Other Visit Diagnoses    Infected sebaceous cyst    -  Primary   Significantly smaller with bactrim- now draining. I&D'd today. Call with any concerns.    Hyperpigmented skin lesion       Unclear if this is eschar or nevus- biopsied today. Await pathology results.     Relevant Orders   Dermatology pathology      Skin Procedure  Procedure: Informed consent given.  Sterile prep of the area.  Area infiltrated with lidocaine with epinephrine.  Using a surgical blade, part of the upper dermis shaved off and sent  for pathology.  I&D performed, capsule removed, wound packed and dressed.  Diagnosis:   ICD-10-CM   1. Infected sebaceous cyst L72.3    L08.9    Significantly smaller with bactrim- now draining. I&D'd today. Call with any concerns.   2. Essential hypertension F62 Basic metabolic panel  3. Hyperpigmented skin lesion L81.9 Dermatology pathology   Unclear if this is eschar or nevus- biopsied today. Await pathology results.     Lesion Location/Size: Back of her head, 1 in round Physician: MJ Consent:  Risks, benefits, and alternative treatments discussed and all questions were answered.  Patient elected to proceed and verbal consent obtained.  Description: Area prepped and draped using semi-sterile technique. Area locally anesthetized using 3cc's of lidocaine 1% with epi. Using a 10 blade scalpel,  a 2cm incision was made above the lesion. Cyst cavity entered and moderate amount of purulent material expressed.  Cyst wall was removed in pieces using mosquito hemostat. Wound packed with iodiform and dressed. Piece of black skin, ?Eschar biopsied and sent for pathology.  Post Procedure Instructions:  Wound care instructions discussed and patient was instructed to keep area clean and dry.  Signs and symptoms of infection discussed, patient agrees to contact the office ASAP should they occur.  Dressing change recommended every other day. Signs and symptoms of infection discussed and patient encouraged to contact the clinic ASAP for concerns.  Follow up plan: Return in about 6 months (around 03/26/2019) for follow up, Monday wick change.

## 2018-09-25 NOTE — Assessment & Plan Note (Signed)
Much better on her lisinopril. Continue lisinopril. Continue to monitor. Call with any concerns.

## 2018-09-26 ENCOUNTER — Telehealth: Payer: Self-pay | Admitting: Family Medicine

## 2018-09-26 NOTE — Telephone Encounter (Signed)
Please let her know that that spot on the back of her head was just a scab, nothing to worry about, not a mole.

## 2018-09-26 NOTE — Telephone Encounter (Signed)
Message relayed to patient. Verbalized understanding and denied questions.   

## 2018-09-29 ENCOUNTER — Ambulatory Visit: Payer: Medicaid Other | Admitting: Family Medicine

## 2018-09-29 ENCOUNTER — Encounter: Payer: Self-pay | Admitting: Family Medicine

## 2018-09-29 VITALS — BP 146/89 | HR 79 | Temp 98.8°F | Wt 117.2 lb

## 2018-09-29 DIAGNOSIS — L089 Local infection of the skin and subcutaneous tissue, unspecified: Secondary | ICD-10-CM

## 2018-09-29 DIAGNOSIS — L723 Sebaceous cyst: Secondary | ICD-10-CM

## 2018-09-29 NOTE — Progress Notes (Signed)
Healing well. No pus. Jill Poole changed today. Return in 2 days for wick change.

## 2018-09-30 ENCOUNTER — Ambulatory Visit: Payer: Medicaid Other | Admitting: Family Medicine

## 2018-10-01 ENCOUNTER — Encounter: Payer: Self-pay | Admitting: Family Medicine

## 2018-10-01 ENCOUNTER — Ambulatory Visit: Payer: Medicaid Other | Admitting: Family Medicine

## 2018-10-01 VITALS — BP 117/80 | HR 89 | Temp 98.3°F | Ht 64.0 in | Wt 116.5 lb

## 2018-10-01 DIAGNOSIS — L089 Local infection of the skin and subcutaneous tissue, unspecified: Secondary | ICD-10-CM

## 2018-10-01 DIAGNOSIS — L723 Sebaceous cyst: Secondary | ICD-10-CM

## 2018-10-01 NOTE — Progress Notes (Signed)
Jill Poole changed today. Continues to improve and get smaller. No sign of cellulitis. Return in 2 days for recheck.

## 2018-10-03 ENCOUNTER — Ambulatory Visit: Payer: Medicaid Other | Admitting: Family Medicine

## 2018-10-03 ENCOUNTER — Other Ambulatory Visit: Payer: Self-pay

## 2018-10-03 ENCOUNTER — Encounter: Payer: Self-pay | Admitting: Family Medicine

## 2018-10-03 VITALS — BP 130/82 | HR 48 | Temp 97.5°F | Ht 64.0 in | Wt 117.0 lb

## 2018-10-03 DIAGNOSIS — L089 Local infection of the skin and subcutaneous tissue, unspecified: Secondary | ICD-10-CM

## 2018-10-03 DIAGNOSIS — L723 Sebaceous cyst: Secondary | ICD-10-CM

## 2018-10-03 NOTE — Progress Notes (Signed)
Healing well. Significantly smaller. Packed 1 more time. May remove on Monday and leave unpacked.

## 2018-10-14 ENCOUNTER — Other Ambulatory Visit: Payer: Self-pay | Admitting: Family Medicine

## 2018-10-14 NOTE — Telephone Encounter (Signed)
Requested medication (s) are due for refill today: yes  Requested medication (s) are on the active medication list: yes  Last refill:  06/06/18  Future visit scheduled: no  Notes to clinic:  Not delegated    Requested Prescriptions  Pending Prescriptions Disp Refills   cyclobenzaprine (FLEXERIL) 10 MG tablet [Pharmacy Med Name: CYCLOBENZAPRINE 10MG  TABLETS] 90 tablet 0    Sig: TAKE 1 TABLET BY MOUTH AT BEDTIME     Not Delegated - Analgesics:  Muscle Relaxants Failed - 10/14/2018  9:24 AM      Failed - This refill cannot be delegated      Passed - Valid encounter within last 6 months    Recent Outpatient Visits          1 week ago Infected sebaceous cyst   [place], Stella, DO   1 week ago Infected sebaceous cyst   [place], Megan P, DO   2 weeks ago Infected sebaceous cyst   [place], DO   2 weeks ago Infected sebaceous cyst   [place], Megan P, DO   4 weeks ago Essential hypertension   [place], Bude, DO

## 2018-10-17 ENCOUNTER — Other Ambulatory Visit: Payer: Self-pay

## 2018-10-17 ENCOUNTER — Encounter: Payer: Self-pay | Admitting: Family Medicine

## 2018-10-17 ENCOUNTER — Ambulatory Visit: Payer: Medicaid Other | Admitting: Family Medicine

## 2018-10-17 VITALS — BP 104/75 | HR 82 | Temp 98.6°F | Ht 64.0 in | Wt 113.0 lb

## 2018-10-17 DIAGNOSIS — I10 Essential (primary) hypertension: Secondary | ICD-10-CM

## 2018-10-17 DIAGNOSIS — L739 Follicular disorder, unspecified: Secondary | ICD-10-CM | POA: Diagnosis not present

## 2018-10-17 MED ORDER — CYCLOBENZAPRINE HCL 10 MG PO TABS: 10.0000 mg | ORAL_TABLET | Freq: Every day | ORAL | 0 refills | Status: DC

## 2018-10-17 MED ORDER — HYDROXYZINE HCL 25 MG PO TABS: 25.0000 mg | ORAL_TABLET | Freq: Three times a day (TID) | ORAL | 1 refills | Status: DC | PRN

## 2018-10-17 MED ORDER — OMEPRAZOLE 20 MG PO CPDR: 20.0000 mg | DELAYED_RELEASE_CAPSULE | Freq: Every day | ORAL | 1 refills | Status: DC

## 2018-10-17 MED ORDER — SULFAMETHOXAZOLE-TRIMETHOPRIM 800-160 MG PO TABS: 1.0000 | ORAL_TABLET | Freq: Two times a day (BID) | ORAL | 0 refills | Status: DC

## 2018-10-17 MED ORDER — TRAZODONE HCL 100 MG PO TABS: ORAL_TABLET | ORAL | 1 refills | Status: DC

## 2018-10-17 MED ORDER — FLUOXETINE HCL 20 MG PO CAPS: 20.0000 mg | ORAL_CAPSULE | Freq: Every day | ORAL | 1 refills | Status: DC

## 2018-10-17 MED ORDER — GABAPENTIN 300 MG PO CAPS: 300.0000 mg | ORAL_CAPSULE | Freq: Three times a day (TID) | ORAL | 1 refills | Status: DC

## 2018-10-17 MED ORDER — LISINOPRIL 10 MG PO TABS: 10.0000 mg | ORAL_TABLET | Freq: Every day | ORAL | 1 refills | Status: DC

## 2018-10-17 MED ORDER — NAPROXEN 500 MG PO TABS: 500.0000 mg | ORAL_TABLET | Freq: Two times a day (BID) | ORAL | 1 refills | Status: DC

## 2018-10-17 MED ORDER — ALBUTEROL SULFATE HFA 108 (90 BASE) MCG/ACT IN AERS: INHALATION_SPRAY | RESPIRATORY_TRACT | 2 refills | Status: DC

## 2018-10-17 MED ORDER — FLUTICASONE-SALMETEROL 250-50 MCG/DOSE IN AEPB: 1.0000 | INHALATION_SPRAY | Freq: Two times a day (BID) | RESPIRATORY_TRACT | 3 refills | Status: DC

## 2018-10-17 NOTE — Progress Notes (Signed)
BP 104/75   Pulse 82   Temp 98.6 F (37 C) (Oral)   Ht 5\' 4"  (1.626 m)   Wt 113 lb (51.3 kg)   LMP 06/17/2011   SpO2 95%   BMI 19.40 kg/m    Subjective:    Patient ID: Jill Poole, female    DOB: 1961-01-22, 58 y.o.   MRN: [id number]  HPI: Jill Poole is a 58 y.o. female  Chief Complaint  Patient presents with  . Wound Check    pt states has had some sores and redness around the wound. pt requests some blood work.   Has been having scabs at the back of her head. She is concerned that she has MRSA and another infection like the abscess. She has noticed a swollen lymph node. No fevers. No chills, No oozing. Some pain and burning. Otherwise feeling well with no other concerns or complaints at this time.  Has not been taking her blood pressure medicine in several days. Feeling well.   Relevant past medical, surgical, family and social history reviewed and updated as indicated. Interim medical history since our last visit reviewed. Allergies and medications reviewed and updated.  Review of Systems  Constitutional: Negative.   Respiratory: Negative.   Cardiovascular: Negative.   Skin: Positive for rash and wound. Negative for color change and pallor.  Neurological: Negative.   Psychiatric/Behavioral: Negative.     Per HPI unless specifically indicated above     Objective:    BP 104/75   Pulse 82   Temp 98.6 F (37 C) (Oral)   Ht 5\' 4"  (1.626 m)   Wt 113 lb (51.3 kg)   LMP 06/17/2011   SpO2 95%   BMI 19.40 kg/m   Wt Readings from Last 3 Encounters:  10/17/18 113 lb (51.3 kg)  10/03/18 117 lb (53.1 kg)  10/01/18 116 lb 8 oz (52.8 kg)    Physical Exam Vitals signs and nursing note reviewed.  Constitutional:      General: She is not in acute distress.    Appearance: Normal appearance. She is not ill-appearing, toxic-appearing or diaphoretic.  HENT:     Head: Normocephalic and atraumatic.     Right Ear: External ear normal.     Left Ear: External ear  normal.     Nose: Nose normal.     Mouth/Throat:     Mouth: Mucous membranes are moist.     Pharynx: Oropharynx is clear.  Eyes:     General: No scleral icterus.       Right eye: No discharge.        Left eye: No discharge.     Extraocular Movements: Extraocular movements intact.     Conjunctiva/sclera: Conjunctivae normal.     Pupils: Pupils are equal, round, and reactive to light.  Neck:     Musculoskeletal: Normal range of motion and neck supple.  Cardiovascular:     Rate and Rhythm: Normal rate and regular rhythm.     Pulses: Normal pulses.     Heart sounds: Normal heart sounds. No murmur. No friction rub. No gallop.   Pulmonary:     Effort: Pulmonary effort is normal. No respiratory distress.     Breath sounds: Normal breath sounds. No stridor. No wheezing, rhonchi or rales.  Chest:     Chest wall: No tenderness.  Musculoskeletal: Normal range of motion.  Skin:    General: Skin is warm and dry.     Capillary Refill: Capillary refill takes less than  2 seconds.     Coloration: Skin is not jaundiced or pale.     Findings: No bruising, erythema, lesion or rash.     Comments: Scabs at back of her head. Abscess healed well, swollen posterior chain lymph node  Neurological:     General: No focal deficit present.     Mental Status: She is alert and oriented to person, place, and time. Mental status is at baseline.  Psychiatric:        Mood and Affect: Mood normal.        Behavior: Behavior normal.        Thought Content: Thought content normal.        Judgment: Judgment normal.     Results for orders placed or performed in visit on 06/06/18  Urine Culture  Result Value Ref Range   Urine Culture, Routine Final report (A)    Organism ID, Bacteria Klebsiella pneumoniae (A)    Antimicrobial Susceptibility Comment   Microscopic Examination  Result Value Ref Range   WBC, UA >30 (A) 0 - 5 /hpf   RBC, UA 0-2 0 - 2 /hpf   Epithelial Cells (non renal) 0-10 0 - 10 /hpf    Bacteria, UA Moderate (A) None seen/Few  CBC with Differential/Platelet  Result Value Ref Range   WBC 7.1 3.4 - 10.8 x10E3/uL   RBC 3.65 (L) 3.77 - 5.28 x10E6/uL   Hemoglobin 13.4 11.1 - 15.9 g/dL   Hematocrit 36.9 34.0 - 46.6 %   MCV 101 (H) 79 - 97 fL   MCH 36.7 (H) 26.6 - 33.0 pg   MCHC 36.3 (H) 31.5 - 35.7 g/dL   RDW 13.1 12.3 - 15.4 %   Platelets 208 150 - 450 x10E3/uL   Neutrophils 72 Not Estab. %   Lymphs 19 Not Estab. %   Monocytes 8 Not Estab. %   Eos 0 Not Estab. %   Basos 1 Not Estab. %   Neutrophils Absolute 5.0 1.4 - 7.0 x10E3/uL   Lymphocytes Absolute 1.4 0.7 - 3.1 x10E3/uL   Monocytes Absolute 0.6 0.1 - 0.9 x10E3/uL   EOS (ABSOLUTE) 0.0 0.0 - 0.4 x10E3/uL   Basophils Absolute 0.1 0.0 - 0.2 x10E3/uL   Immature Granulocytes 0 Not Estab. %   Immature Grans (Abs) 0.0 0.0 - 0.1 x10E3/uL  Comprehensive metabolic panel  Result Value Ref Range   Glucose 98 65 - 99 mg/dL   BUN 5 (L) 6 - 24 mg/dL   Creatinine, Ser 0.73 0.57 - 1.00 mg/dL   GFR calc non Af Amer 93 >59 mL/min/1.73   GFR calc Af Amer 107 >59 mL/min/1.73   BUN/Creatinine Ratio 7 (L) 9 - 23   Sodium 133 (L) 134 - 144 mmol/L   Potassium 4.2 3.5 - 5.2 mmol/L   Chloride 90 (L) 96 - 106 mmol/L   CO2 22 20 - 29 mmol/L   Calcium 9.6 8.7 - 10.2 mg/dL   Total Protein 7.0 6.0 - 8.5 g/dL   Albumin 4.4 3.5 - 5.5 g/dL   Globulin, Total 2.6 1.5 - 4.5 g/dL   Albumin/Globulin Ratio 1.7 1.2 - 2.2   Bilirubin Total 0.5 0.0 - 1.2 mg/dL   Alkaline Phosphatase 122 (H) 39 - 117 IU/L   AST 71 (H) 0 - 40 IU/L   ALT 42 (H) 0 - 32 IU/L  Lipid Panel w/o Chol/HDL Ratio  Result Value Ref Range   Cholesterol, Total 219 (H) 100 - 199 mg/dL   Triglycerides 50 0 -  149 mg/dL   HDL 143 >39 mg/dL   VLDL Cholesterol Cal 10 5 - 40 mg/dL   LDL Calculated 66 0 - 99 mg/dL  Microalbumin, Urine Waived  Result Value Ref Range   Microalb, Ur Waived 80 (H) 0 - 19 mg/L   Creatinine, Urine Waived 200 10 - 300 mg/dL   Microalb/Creat Ratio  30-300 (H) <30 mg/g  TSH  Result Value Ref Range   TSH 1.290 0.450 - 4.500 uIU/mL  UA/M w/rflx Culture, Routine  Result Value Ref Range   Specific Gravity, UA 1.015 1.005 - 1.030   pH, UA 6.0 5.0 - 7.5   Color, UA Yellow Yellow   Appearance Ur Cloudy (A) Clear   Leukocytes, UA 1+ (A) Negative   Protein, UA Trace (A) Negative/Trace   Glucose, UA Negative Negative   Ketones, UA Negative Negative   RBC, UA 1+ (A) Negative   Urobilinogen, Ur 0.2 0.2 - 1.0 mg/dL   Nitrite, UA Positive (A) Negative   Microscopic Examination See below:   Cytology - PAP  Result Value Ref Range   Adequacy      Satisfactory for evaluation  endocervical/transformation zone component PRESENT.   Diagnosis      NEGATIVE FOR INTRAEPITHELIAL LESIONS OR MALIGNANCY.   HPV 16/18/45 genotyping NEGATIVE for HPV 16 & 18/45    HPV DETECTED (A)    Material Submitted CervicoVaginal Pap [ThinPrep Imaged]       Assessment & Plan:   Problem List Items Addressed This Visit      Cardiovascular and Mediastinum   HTN (hypertension)    BP running low today off lisinopril. Remain off lisinopril and recheck 1 month.        Other Visit Diagnoses    Folliculitis    -  Primary   Will treat with bactrim. Call with any concerns. Continue to montior.        Follow up plan: Return in about 4 weeks (around 11/14/2018).

## 2018-10-17 NOTE — Assessment & Plan Note (Signed)
BP running low today off lisinopril. Remain off lisinopril and recheck 1 month.

## 2018-11-04 ENCOUNTER — Other Ambulatory Visit: Payer: Self-pay | Admitting: Family Medicine

## 2018-11-05 NOTE — Telephone Encounter (Signed)
Requested medication (s) are due for refill today: No  Requested medication (s) are on the active medication list: Yes  Last refill:  10/17/18  Future visit scheduled: Yes  Notes to clinic:  Pt. Taking medication differently    Requested Prescriptions  Pending Prescriptions Disp Refills   omeprazole (PRILOSEC) 20 MG capsule [Pharmacy Med Name: OMEPRAZOLE DR 20 MG CAPSULE]  0    Sig: Please specify directions, refills and quantity     Gastroenterology: Proton Pump Inhibitors Passed - 11/04/2018  7:13 PM      Passed - Valid encounter within last 12 months    Recent Outpatient Visits          2 weeks ago [place], Megan P, DO   1 month ago Infected sebaceous cyst   [place], [place], DO   1 month ago Infected sebaceous cyst   [place], [place], DO   1 month ago Infected sebaceous cyst   [place], [place], DO   1 month ago Infected sebaceous cyst   [facility] Valerie Roys, DO      Future Appointments            In 1 week Wynetta Emery, Barb Merino, DO [facility]

## 2018-11-14 ENCOUNTER — Ambulatory Visit: Payer: Medicaid Other | Admitting: Family Medicine

## 2018-12-04 ENCOUNTER — Telehealth: Payer: Self-pay | Admitting: Family Medicine

## 2018-12-04 NOTE — Telephone Encounter (Signed)
We can do telephone visit. But needs appointment.

## 2018-12-04 NOTE — Telephone Encounter (Signed)
Patient is getting someone to help her download the skype app on her phone. They will call back to schedule virtual appt with Dr Wynetta Emery

## 2018-12-04 NOTE — Telephone Encounter (Signed)
Patient is losing weight extremely quickly with no diarrhea or vomiting. She states her anxiety level out of control with the medication not helping Prozac... She is hoping to have Valium called in.  Please advise if she needs virtual appointment or in office.   Thank You   [phone]

## 2018-12-04 NOTE — Telephone Encounter (Signed)
Patient calling and states that she is having issues with her phone being able to download the app. Would like a call back.

## 2018-12-04 NOTE — Telephone Encounter (Signed)
Needs virtual appointment 

## 2018-12-04 NOTE — Telephone Encounter (Signed)
No worries at all 

## 2018-12-05 ENCOUNTER — Other Ambulatory Visit: Payer: Self-pay

## 2018-12-05 ENCOUNTER — Ambulatory Visit (INDEPENDENT_AMBULATORY_CARE_PROVIDER_SITE_OTHER): Payer: Medicaid Other | Admitting: Family Medicine

## 2018-12-05 ENCOUNTER — Encounter: Payer: Self-pay | Admitting: Family Medicine

## 2018-12-05 VITALS — BP 129/88 | HR 107 | Temp 97.8°F | Ht 64.0 in | Wt 110.0 lb

## 2018-12-05 DIAGNOSIS — F4321 Adjustment disorder with depressed mood: Secondary | ICD-10-CM

## 2018-12-05 DIAGNOSIS — F331 Major depressive disorder, recurrent, moderate: Secondary | ICD-10-CM | POA: Diagnosis not present

## 2018-12-05 MED ORDER — LORAZEPAM 0.5 MG PO TABS: 0.5000 mg | ORAL_TABLET | Freq: Two times a day (BID) | ORAL | 0 refills | Status: DC | PRN

## 2018-12-05 MED ORDER — FLUOXETINE HCL 40 MG PO CAPS: 40.0000 mg | ORAL_CAPSULE | Freq: Every day | ORAL | 3 refills | Status: DC

## 2018-12-05 NOTE — Progress Notes (Signed)
BP 129/88   Pulse (!) 107   Temp 97.8 F (36.6 C) (Oral)   Ht 5\' 4"  (1.626 m)   Wt 110 lb (49.9 kg)   LMP 06/17/2011   BMI 18.88 kg/m    Subjective:    Patient ID: Jill Poole, female    DOB: [birth year], 58 y.o.   MRN: [id number]  HPI: Ariona Deschene is a 58 y.o. female  Chief Complaint  Patient presents with  . Anxiety  . TELEMEDICINE VISIT   ANXIETY/STRESS- has not been feeling like herself. Has been feeling way more anxious in the past 2-3 weeks. She's got a lot of personal stuff going on. Recently had a family member murdered. Has not been feeing like herself.  Duration:exacerbated Anxious mood: yes  Excessive worrying: yes Irritability: yes  Sweating: no Nausea: no Palpitations:no Hyperventilation: no Panic attacks: yes Agoraphobia: no  Obscessions/compulsions: no Depressed mood: yes Depression screen [facility] 2/9 12/05/2018 06/06/2018 04/01/2018 11/29/2017 02/25/2017  Decreased Interest 3 0 2 0 0  Down, Depressed, Hopeless 3 0 0 0 0  PHQ - 2 Score 6 0 2 0 0  Altered sleeping 3 2 0 3 -  Tired, decreased energy 3 0 0 0 -  Change in appetite 3 0 0 3 -  Feeling bad or failure about yourself  2 0 0 0 -  Trouble concentrating 3 0 1 0 -  Moving slowly or fidgety/restless 3 0 0 0 -  Suicidal thoughts 1 0 0 0 -  PHQ-9 Score 24 2 3 6  -  Difficult doing work/chores - - Somewhat difficult - -   GAD 7 : Generalized Anxiety Score 12/05/2018 06/06/2018 02/25/2017 09/14/2016  Nervous, Anxious, on Edge 3 3 3 2   Control/stop worrying 3 0 3 3  Worry too much - different things 3 3 0 3  Trouble relaxing 3 0 0 2  Restless 3 0 3 3  Easily annoyed or irritable 3 0 3 3  Afraid - awful might happen 3 3 0 0  Total GAD 7 Score 21 9 12 16   Anxiety Difficulty - - Not difficult at all -   Anhedonia: no Weight changes: yes Insomnia: yes   Hypersomnia: no Fatigue/loss of energy: yes Feelings of worthlessness: yes Feelings of guilt: yes Impaired concentration/indecisiveness: yes Suicidal  ideations: no  Crying spells: yes Recent Stressors/Life Changes: yes   Relationship problems: yes   Family stress: yes     Financial stress: yes    Job stress: no    Recent death/loss: yes   Relevant past medical, surgical, family and social history reviewed and updated as indicated. Interim medical history since our last visit reviewed. Allergies and medications reviewed and updated.  Review of Systems  Constitutional: Positive for appetite change, fatigue and unexpected weight change. Negative for activity change, chills, diaphoresis and fever.  Respiratory: Negative.   Cardiovascular: Negative.   Musculoskeletal: Negative.   Neurological: Negative.   Psychiatric/Behavioral: Positive for agitation and dysphoric mood. Negative for behavioral problems, confusion, decreased concentration and hallucinations. The patient is nervous/anxious. The patient is not hyperactive.     Per HPI unless specifically indicated above     Objective:    BP 129/88   Pulse (!) 107   Temp 97.8 F (36.6 C) (Oral)   Ht 5\' 4"  (1.626 m)   Wt 110 lb (49.9 kg)   LMP 06/17/2011   BMI 18.88 kg/m   Wt Readings from Last 3 Encounters:  12/05/18 110 lb (49.9 kg)  10/17/18  113 lb (51.3 kg)  10/03/18 117 lb (53.1 kg)    Physical Exam Vitals signs and nursing note reviewed.  Pulmonary:     Effort: Pulmonary effort is normal. No respiratory distress.     Comments: Speaking in full sentences Neurological:     Mental Status: She is alert.  Psychiatric:        Mood and Affect: Mood normal.        Behavior: Behavior normal.        Thought Content: Thought content normal.        Judgment: Judgment normal.     Results for orders placed or performed in visit on 06/06/18  Urine Culture  Result Value Ref Range   Urine Culture, Routine Final report (A)    Organism ID, Bacteria Klebsiella pneumoniae (A)    Antimicrobial Susceptibility Comment   Microscopic Examination  Result Value Ref Range   WBC, UA  >30 (A) 0 - 5 /hpf   RBC, UA 0-2 0 - 2 /hpf   Epithelial Cells (non renal) 0-10 0 - 10 /hpf   Bacteria, UA Moderate (A) None seen/Few  CBC with Differential/Platelet  Result Value Ref Range   WBC 7.1 3.4 - 10.8 x10E3/uL   RBC 3.65 (L) 3.77 - 5.28 x10E6/uL   Hemoglobin 13.4 11.1 - 15.9 g/dL   Hematocrit 36.9 34.0 - 46.6 %   MCV 101 (H) 79 - 97 fL   MCH 36.7 (H) 26.6 - 33.0 pg   MCHC 36.3 (H) 31.5 - 35.7 g/dL   RDW 13.1 12.3 - 15.4 %   Platelets 208 150 - 450 x10E3/uL   Neutrophils 72 Not Estab. %   Lymphs 19 Not Estab. %   Monocytes 8 Not Estab. %   Eos 0 Not Estab. %   Basos 1 Not Estab. %   Neutrophils Absolute 5.0 1.4 - 7.0 x10E3/uL   Lymphocytes Absolute 1.4 0.7 - 3.1 x10E3/uL   Monocytes Absolute 0.6 0.1 - 0.9 x10E3/uL   EOS (ABSOLUTE) 0.0 0.0 - 0.4 x10E3/uL   Basophils Absolute 0.1 0.0 - 0.2 x10E3/uL   Immature Granulocytes 0 Not Estab. %   Immature Grans (Abs) 0.0 0.0 - 0.1 x10E3/uL  Comprehensive metabolic panel  Result Value Ref Range   Glucose 98 65 - 99 mg/dL   BUN 5 (L) 6 - 24 mg/dL   Creatinine, Ser 0.73 0.57 - 1.00 mg/dL   GFR calc non Af Amer 93 >59 mL/min/1.73   GFR calc Af Amer 107 >59 mL/min/1.73   BUN/Creatinine Ratio 7 (L) 9 - 23   Sodium 133 (L) 134 - 144 mmol/L   Potassium 4.2 3.5 - 5.2 mmol/L   Chloride 90 (L) 96 - 106 mmol/L   CO2 22 20 - 29 mmol/L   Calcium 9.6 8.7 - 10.2 mg/dL   Total Protein 7.0 6.0 - 8.5 g/dL   Albumin 4.4 3.5 - 5.5 g/dL   Globulin, Total 2.6 1.5 - 4.5 g/dL   Albumin/Globulin Ratio 1.7 1.2 - 2.2   Bilirubin Total 0.5 0.0 - 1.2 mg/dL   Alkaline Phosphatase 122 (H) 39 - 117 IU/L   AST 71 (H) 0 - 40 IU/L   ALT 42 (H) 0 - 32 IU/L  Lipid Panel w/o Chol/HDL Ratio  Result Value Ref Range   Cholesterol, Total 219 (H) 100 - 199 mg/dL   Triglycerides 50 0 - 149 mg/dL   HDL 143 >39 mg/dL   VLDL Cholesterol Cal 10 5 - 40 mg/dL   LDL  Calculated 66 0 - 99 mg/dL  Microalbumin, Urine Waived  Result Value Ref Range   Microalb, Ur  Waived 80 (H) 0 - 19 mg/L   Creatinine, Urine Waived 200 10 - 300 mg/dL   Microalb/Creat Ratio 30-300 (H) <30 mg/g  TSH  Result Value Ref Range   TSH 1.290 0.450 - 4.500 uIU/mL  UA/M w/rflx Culture, Routine  Result Value Ref Range   Specific Gravity, UA 1.015 1.005 - 1.030   pH, UA 6.0 5.0 - 7.5   Color, UA Yellow Yellow   Appearance Ur Cloudy (A) Clear   Leukocytes, UA 1+ (A) Negative   Protein, UA Trace (A) Negative/Trace   Glucose, UA Negative Negative   Ketones, UA Negative Negative   RBC, UA 1+ (A) Negative   Urobilinogen, Ur 0.2 0.2 - 1.0 mg/dL   Nitrite, UA Positive (A) Negative   Microscopic Examination See below:   Cytology - PAP  Result Value Ref Range   Adequacy      Satisfactory for evaluation  endocervical/transformation zone component PRESENT.   Diagnosis      NEGATIVE FOR INTRAEPITHELIAL LESIONS OR MALIGNANCY.   HPV 16/18/45 genotyping NEGATIVE for HPV 16 & 18/45    HPV DETECTED (A)    Material Submitted CervicoVaginal Pap [ThinPrep Imaged]       Assessment & Plan:   Problem List Items Addressed This Visit      Other   Depression - Primary    Not doing well. Exacerbated with recent murder of a family member. Will get her into grief counseling. Will increase her fluoxetine to 40mg  daily and start PRN lorazepam BID for right now. Recheck 2 weeks. Call with any concerns.       Relevant Medications   FLUoxetine (PROZAC) 40 MG capsule   LORazepam (ATIVAN) 0.5 MG tablet   Other Relevant Orders   Referral to Chronic Care Management Services    Other Visit Diagnoses    Grief       See discussion under depression   Relevant Orders   Referral to Chronic Care Management Services       Follow up plan: Return in about 2 weeks (around 12/19/2018) for follow up mood.    . This visit was completed via telephone due to the restrictions of the COVID-19 pandemic. All issues as above were discussed and addressed but no physical exam was performed. If it was felt  that the patient should be evaluated in the office, they were directed there. The patient verbally consented to this visit. Patient was unable to complete an audio/visual visit due to Lack of equipment. Due to the catastrophic nature of the COVID-19 pandemic, this visit was done through audio contact only. . Location of the patient: home . Location of the provider: home . Those involved with this call:  . Provider: Park Liter, DO . CMA: Lesle Chris, [place] . Front Desk/Registration: Don Perking  . Time spent on call: 21 minutes on the phone discussing health concerns

## 2018-12-05 NOTE — Assessment & Plan Note (Addendum)
Not doing well. Exacerbated with recent murder of a family member. Will get her into grief counseling. Will increase her fluoxetine to 40mg  daily and start PRN lorazepam BID for right now. Recheck 2 weeks. Call with any concerns.

## 2018-12-09 ENCOUNTER — Ambulatory Visit: Payer: Self-pay | Admitting: Licensed Clinical Social Worker

## 2018-12-09 ENCOUNTER — Other Ambulatory Visit: Payer: Self-pay

## 2018-12-09 DIAGNOSIS — F4321 Adjustment disorder with depressed mood: Secondary | ICD-10-CM

## 2018-12-09 NOTE — Chronic Care Management (AMB) (Signed)
  Chronic Care Management   Initial Visit Note  12/09/2018 Name: Jill Poole MRN: [id number] DOB: 1960-11-21  Referred by: Valerie Roys, DO Reason for referral : Chronic Care Management (Initial Outreach - Grief Counseling)   Jill Poole is a 57 y.o. year old female who is a primary care patient of Valerie Roys, DO. The CCM team was consulted for assistance with chronic disease management and care coordination needs.   Review of patient status, including review of consultants reports, relevant laboratory and other test results, and collaboration with appropriate care team members and the patient's provider was performed as part of comprehensive patient evaluation and provision of chronic care management services.    SDOH (Social Determinants of Health) screening performed today. See Care Plan Entry related to challenges with: Stress  Objective:   Goals Addressed    . "I need help with grief" (pt-stated)       Current Barriers:  Marland Kitchen Mental Health Concerns - patient requests grief counseling resources (recent loss of loved one by violence)  Clinical Social Work Clinical Goal(s):  Marland Kitchen Over the next 30 days, client will work with SW to address concerns related to grief counseling - LCSW to follow up with patient over the next 30 days to ensure that patient is connected to community grief counseling resource.  . Over the next 90 days, patient will work with community grief counselor [place] [facility] [place] [phone] to address needs related to grief/loss.  Interventions: . Patient interviewed and appropriate assessments performed . Provided patient with information about community grief counseling resources . Discussed plans with patient for ongoing care management follow up and provided patient with direct contact information for care management team . Advised patient to reach out to [facility] grief counselor  Patient Self Care Activities:   . Currently UNABLE TO independently self manage grief process  Initial goal documentation     Ms. Reader was given information about Care Management services today including:  1. CM service includes personalized support from designated clinical staff supervised by her physician, including individualized plan of care and coordination with other care providers 2. 24/7 contact phone numbers for assistance for urgent and routine care needs. 3. The patient may stop CM services at any time by phone call to the office staff.  Patient agreed to services and verbal consent obtained.   The CM team will reach out to the patient again over the next 7 days.   Janalyn Shy MHA,BSN,RN,CCM Nurse Care Coordinator [facility] Care Management [phone]

## 2018-12-09 NOTE — Patient Instructions (Signed)
Visit Information  Goals Addressed            This Visit's Progress   . "I need help with grief" (pt-stated)       Current Barriers:  Marland Kitchen Mental Health Concerns - patient requests grief counseling resources (recent loss of loved one by violence)  Clinical Social Work Clinical Goal(s):  Marland Kitchen Over the next 30 days, client will work with SW to address concerns related to grief counseling - LCSW to follow up with patient over the next 30 days to ensure that patient is connected to community grief counseling resource.  . Over the next 90 days, patient will work with community grief counselor [place] @ [facility] [place] [phone] to address needs related to grief/loss.  Interventions: . Patient interviewed and appropriate assessments performed . Provided patient with information about community grief counseling resources . Discussed plans with patient for ongoing care management follow up and provided patient with direct contact information for care management team . Advised patient to reach out to [facility] grief counselor  Patient Self Care Activities:  . Currently UNABLE TO independently self manage grief process  Initial goal documentation       Ms. Frech was given information about Chronic Care Management services today including:  1. CCM service includes personalized support from designated clinical staff supervised by her physician, including individualized plan of care and coordination with other care providers 2. 24/7 contact phone numbers for assistance for urgent and routine care needs. 3. The patient may stop CM services at any time by phone call to the office staff.   Patient agreed to services and verbal consent obtained.   The patient verbalized understanding of instructions provided today and declined a print copy of patient instruction materials.   The CM team will reach out to the patient again over the next 7 days.   Janalyn Shy MHA,BSN,RN,CCM Nurse Care Coordinator [facility] Care Management [phone]

## 2018-12-11 ENCOUNTER — Telehealth: Payer: Self-pay | Admitting: Family Medicine

## 2018-12-11 NOTE — Telephone Encounter (Signed)
Copied from [place] [phone]. Topic: Quick Communication - Rx Refill/Question >> Dec 11, 2018  5:02 PM Sharene Skeans wrote: Medication: Pt has thrush coming on her tongue and the medicated mouth wash that her dentist gave her makes her gag and vomit. Pt wants to know if there is something you can prescribe for that and the Pt also states that the cyst on the back of her head are starting to come back and she wanted to know if an anti biotic can be called before it gets as bad as it was.   Has the patient contacted their pharmacy?  Preferred Pharmacy (with phone number or street name): [id number] - [place], [place]. [phone] (Phone) [phone] (Fax)    Agent: Please be advised that RX refills may take up to 3 business days. We ask that you follow-up with your pharmacy.

## 2018-12-12 NOTE — Telephone Encounter (Signed)
Can you please get her set up for a virtual visit?

## 2018-12-12 NOTE — Telephone Encounter (Signed)
Called pt at home, line busy. LVM on mobile.

## 2018-12-15 ENCOUNTER — Encounter: Payer: Self-pay | Admitting: Family Medicine

## 2018-12-15 ENCOUNTER — Ambulatory Visit (INDEPENDENT_AMBULATORY_CARE_PROVIDER_SITE_OTHER): Payer: Medicaid Other | Admitting: Family Medicine

## 2018-12-15 ENCOUNTER — Other Ambulatory Visit: Payer: Self-pay

## 2018-12-15 VITALS — BP 137/82 | HR 110 | Ht 64.0 in | Wt 110.0 lb

## 2018-12-15 DIAGNOSIS — W19XXXA Unspecified fall, initial encounter: Secondary | ICD-10-CM

## 2018-12-15 DIAGNOSIS — L089 Local infection of the skin and subcutaneous tissue, unspecified: Secondary | ICD-10-CM | POA: Diagnosis not present

## 2018-12-15 DIAGNOSIS — B370 Candidal stomatitis: Secondary | ICD-10-CM | POA: Diagnosis not present

## 2018-12-15 DIAGNOSIS — L723 Sebaceous cyst: Secondary | ICD-10-CM

## 2018-12-15 MED ORDER — SULFAMETHOXAZOLE-TRIMETHOPRIM 800-160 MG PO TABS: 1.0000 | ORAL_TABLET | Freq: Two times a day (BID) | ORAL | 0 refills | Status: DC

## 2018-12-15 MED ORDER — FLUCONAZOLE 150 MG PO TABS: 150.0000 mg | ORAL_TABLET | Freq: Every day | ORAL | 1 refills | Status: DC

## 2018-12-15 NOTE — Progress Notes (Signed)
BP 137/82   Pulse (!) 110   Ht 5\' 4"  (1.626 m)   Wt 110 lb (49.9 kg)   LMP 06/17/2011   BMI 18.88 kg/m    Subjective:    Patient ID: Jill Poole, female    DOB: Nov 03, 1960, 58 y.o.   MRN: [id number]  HPI: Jill Poole is a 58 y.o. female  Chief Complaint  Patient presents with  . Thrush    3-4 days. Used a new toothbrush w/o much success.   . Fall    Recently. Pt beleives it is due to Back    Had a fall the day before yesterday. She notes that her leg went out on her and she fell down and hit her head. Did not pass out. No LOC. No headaches, only pain when she touches it. She states that she is more embarrassed than anything about the fall, not in significant pain. No neurologic changes.   Has been having a white film on her tongue for about 4 days. She got a new toothbrush and was working with her dentist who gave her magic mouthwash, but it made her throw up, so she couldn't take it.   LUMP- the spot that she had the infected sebaceous cyst on the back of her head seems to be coming back. She notes that it's not the same spot, but a little lower. She is afraid it's going to get as bad as it did several months ago. She denies any oozing or pus. Duration: few days Location: back of her head down by her neck Onset: sudden Painful: yes Discomfort: yes Status:  bigger Trauma: no Redness: yes Bruising: no Recent infection: no Swollen lymph nodes: no Requesting removal: yes History of cancer: no Family history of cancer: no History of the same: yes  Relevant past medical, surgical, family and social history reviewed and updated as indicated. Interim medical history since our last visit reviewed. Allergies and medications reviewed and updated.  Review of Systems  Constitutional: Negative.   HENT: Negative.   Respiratory: Negative.   Cardiovascular: Negative.   Gastrointestinal: Negative.   Musculoskeletal: Negative.   Skin: Positive for wound. Negative for color  change, pallor and rash.  Neurological: Negative.   Psychiatric/Behavioral: Negative.     Per HPI unless specifically indicated above     Objective:    BP 137/82   Pulse (!) 110   Ht 5\' 4"  (1.626 m)   Wt 110 lb (49.9 kg)   LMP 06/17/2011   BMI 18.88 kg/m   Wt Readings from Last 3 Encounters:  12/15/18 110 lb (49.9 kg)  12/05/18 110 lb (49.9 kg)  10/17/18 113 lb (51.3 kg)    Physical Exam Vitals signs and nursing note reviewed.  Pulmonary:     Effort: Pulmonary effort is normal. No respiratory distress.     Comments: Speaking in full sentences Neurological:     Mental Status: She is alert.  Psychiatric:        Mood and Affect: Mood normal.        Behavior: Behavior normal.        Thought Content: Thought content normal.        Judgment: Judgment normal.     Results for orders placed or performed in visit on 06/06/18  Urine Culture  Result Value Ref Range   Urine Culture, Routine Final report (A)    Organism ID, Bacteria Klebsiella pneumoniae (A)    Antimicrobial Susceptibility Comment   Microscopic Examination  Result  Value Ref Range   WBC, UA >30 (A) 0 - 5 /hpf   RBC, UA 0-2 0 - 2 /hpf   Epithelial Cells (non renal) 0-10 0 - 10 /hpf   Bacteria, UA Moderate (A) None seen/Few  CBC with Differential/Platelet  Result Value Ref Range   WBC 7.1 3.4 - 10.8 x10E3/uL   RBC 3.65 (L) 3.77 - 5.28 x10E6/uL   Hemoglobin 13.4 11.1 - 15.9 g/dL   Hematocrit 36.9 34.0 - 46.6 %   MCV 101 (H) 79 - 97 fL   MCH 36.7 (H) 26.6 - 33.0 pg   MCHC 36.3 (H) 31.5 - 35.7 g/dL   RDW 13.1 12.3 - 15.4 %   Platelets 208 150 - 450 x10E3/uL   Neutrophils 72 Not Estab. %   Lymphs 19 Not Estab. %   Monocytes 8 Not Estab. %   Eos 0 Not Estab. %   Basos 1 Not Estab. %   Neutrophils Absolute 5.0 1.4 - 7.0 x10E3/uL   Lymphocytes Absolute 1.4 0.7 - 3.1 x10E3/uL   Monocytes Absolute 0.6 0.1 - 0.9 x10E3/uL   EOS (ABSOLUTE) 0.0 0.0 - 0.4 x10E3/uL   Basophils Absolute 0.1 0.0 - 0.2 x10E3/uL    Immature Granulocytes 0 Not Estab. %   Immature Grans (Abs) 0.0 0.0 - 0.1 x10E3/uL  Comprehensive metabolic panel  Result Value Ref Range   Glucose 98 65 - 99 mg/dL   BUN 5 (L) 6 - 24 mg/dL   Creatinine, Ser 0.73 0.57 - 1.00 mg/dL   GFR calc non Af Amer 93 >59 mL/min/1.73   GFR calc Af Amer 107 >59 mL/min/1.73   BUN/Creatinine Ratio 7 (L) 9 - 23   Sodium 133 (L) 134 - 144 mmol/L   Potassium 4.2 3.5 - 5.2 mmol/L   Chloride 90 (L) 96 - 106 mmol/L   CO2 22 20 - 29 mmol/L   Calcium 9.6 8.7 - 10.2 mg/dL   Total Protein 7.0 6.0 - 8.5 g/dL   Albumin 4.4 3.5 - 5.5 g/dL   Globulin, Total 2.6 1.5 - 4.5 g/dL   Albumin/Globulin Ratio 1.7 1.2 - 2.2   Bilirubin Total 0.5 0.0 - 1.2 mg/dL   Alkaline Phosphatase 122 (H) 39 - 117 IU/L   AST 71 (H) 0 - 40 IU/L   ALT 42 (H) 0 - 32 IU/L  Lipid Panel w/o Chol/HDL Ratio  Result Value Ref Range   Cholesterol, Total 219 (H) 100 - 199 mg/dL   Triglycerides 50 0 - 149 mg/dL   HDL 143 >39 mg/dL   VLDL Cholesterol Cal 10 5 - 40 mg/dL   LDL Calculated 66 0 - 99 mg/dL  Microalbumin, Urine Waived  Result Value Ref Range   Microalb, Ur Waived 80 (H) 0 - 19 mg/L   Creatinine, Urine Waived 200 10 - 300 mg/dL   Microalb/Creat Ratio 30-300 (H) <30 mg/g  TSH  Result Value Ref Range   TSH 1.290 0.450 - 4.500 uIU/mL  UA/M w/rflx Culture, Routine  Result Value Ref Range   Specific Gravity, UA 1.015 1.005 - 1.030   pH, UA 6.0 5.0 - 7.5   Color, UA Yellow Yellow   Appearance Ur Cloudy (A) Clear   Leukocytes, UA 1+ (A) Negative   Protein, UA Trace (A) Negative/Trace   Glucose, UA Negative Negative   Ketones, UA Negative Negative   RBC, UA 1+ (A) Negative   Urobilinogen, Ur 0.2 0.2 - 1.0 mg/dL   Nitrite, UA Positive (A) Negative   Microscopic  Examination See below:   Cytology - PAP  Result Value Ref Range   Adequacy      Satisfactory for evaluation  endocervical/transformation zone component PRESENT.   Diagnosis      NEGATIVE FOR INTRAEPITHELIAL LESIONS  OR MALIGNANCY.   HPV 16/18/45 genotyping NEGATIVE for HPV 16 & 18/45    HPV DETECTED (A)    Material Submitted CervicoVaginal Pap [ThinPrep Imaged]       Assessment & Plan:   Problem List Items Addressed This Visit    None    Visit Diagnoses    Infected sebaceous cyst    -  Primary   Will treat with bactrim. This is recurrent, so will get her into general surgery for evalation. Referral generated today. Call with any concerns.    Relevant Medications   sulfamethoxazole-trimethoprim (BACTRIM DS,SEPTRA DS) 800-160 MG tablet   fluconazole (DIFLUCAN) 150 MG tablet   Other Relevant Orders   Ambulatory referral to General Surgery   Fall, initial encounter       No reported neurologic signs. Continue to monitor. Call with any concerns.    Thrush       Will treat with diflucan. Refill given as just started on bactrim. Call with any concerns or if not getting better.   Relevant Medications   sulfamethoxazole-trimethoprim (BACTRIM DS,SEPTRA DS) 800-160 MG tablet   fluconazole (DIFLUCAN) 150 MG tablet       Follow up plan: Return in about 2 weeks (around 12/29/2018) for follow up cyst.   . This visit was completed via telephone due to the restrictions of the COVID-19 pandemic. All issues as above were discussed and addressed but no physical exam was performed. If it was felt that the patient should be evaluated in the office, they were directed there. The patient verbally consented to this visit. Patient was unable to complete an audio/visual visit due to Lack of equipment. Due to the catastrophic nature of the COVID-19 pandemic, this visit was done through audio contact only. . Location of the patient: home . Location of the provider: home . Those involved with this call:  . Provider: Park Liter, DO . CMA: Gerda Diss, CMA . Front Desk/Registration: Don Perking  . Time spent on call: 25 minutes on the phone discussing health concerns. 40 minutes total spent in review of patient's  record and preparation of their chart.

## 2018-12-16 ENCOUNTER — Other Ambulatory Visit: Payer: Self-pay

## 2018-12-16 ENCOUNTER — Ambulatory Visit: Payer: Self-pay | Admitting: Licensed Clinical Social Worker

## 2018-12-16 DIAGNOSIS — F4321 Adjustment disorder with depressed mood: Secondary | ICD-10-CM

## 2018-12-16 DIAGNOSIS — F331 Major depressive disorder, recurrent, moderate: Secondary | ICD-10-CM

## 2018-12-16 NOTE — Patient Instructions (Signed)
Licensed Clinical Social Worker Visit Information  Goals we discussed today:  Goals Addressed    . "I need help with grief" (pt-stated)       Current Barriers:  Marland Kitchen Mental Health Concerns - patient requests grief counseling resources (recent loss of loved one by violence)  Clinical Social Work Clinical Goal(s):  Marland Kitchen Over the next 90 days, client will work with SW to address concerns related to grief counseling - LCSW to follow up with patient over the next 90 days to ensure that patient is connected to community grief counseling resource.  . Over the next 90 days, patient will work with community grief counselor [place] @ [facility] [phone] to address needs related to grief/loss.  Interventions: . Patient interviewed and appropriate assessments performed . Provided patient with information about community grief counseling resources . Discussed plans with patient for ongoing care management follow up and provided patient with direct contact information for care management team . Advised patient to to keep up with future grief counseling appointments. Patient's first grief counseling session will take place today on 12/16/2018 and will be telephonic instead of in person due to [place] . Assisted patient/caregiver with obtaining information about health plan benefits  Patient Self Care Activities:  . Currently UNABLE TO independently self manage grief process  Please see past updates related to this goal by clicking on the "Past Updates" button in the selected goal     Materials Provided: Verbal education about grief support resources provided by phone  Follow Up Plan: SW will follow up with patient by phone over the next 30 days  Eula Fried, [place], MSW, [email or web address] Phone: [phone]

## 2018-12-16 NOTE — Chronic Care Management (AMB) (Signed)
  Chronic Care Management    Clinical Social Work Introduction Note  12/16/2018 Name: Carlisia Geno MRN: [id number] DOB: 1960-12-03  LCSW completed outreach call to Dashawna Delbridge who is a 58 y.o. year old female primary care patient of Valerie Roys, DO. LCSW provided information/support about clinical social work services available for Grief Counseling. Patient reports having her first grief counseling session today at 3 pm.   Review of patient status, including review of consultants reports, other relevant assessments, and collaboration with appropriate care team members and the patient's provider was performed as part of comprehensive patient evaluation and provision of chronic care management services.    Goals Addressed    . "I need help with grief" (pt-stated)       Current Barriers:  Marland Kitchen Mental Health Concerns - patient requests grief counseling resources (recent loss of loved one by violence)  Clinical Social Work Clinical Goal(s):  Marland Kitchen Over the next 90 days, client will work with SW to address concerns related to grief counseling - LCSW to follow up with patient over the next 90 days to ensure that patient is connected to community grief counseling resource.  . Over the next 90 days, patient will work with community grief counselor [place] @ [facility] [phone] to address needs related to grief/loss.  Interventions: . Patient interviewed and appropriate assessments performed . Provided patient with information about community grief counseling resources . Discussed plans with patient for ongoing care management follow up and provided patient with direct contact information for care management team . Advised patient to to keep up with future grief counseling appointments. Patient's first grief counseling session will take place today on 12/16/2018 and will be telephonic instead of in person due to [place] . Assisted patient/caregiver with  obtaining information about health plan benefits  Patient Self Care Activities:  . Currently UNABLE TO independently self manage grief process  Please see past updates related to this goal by clicking on the "Past Updates" button in the selected goal      Follow Up Plan: SW will follow up with patient by phone over the next 30 days  Eula Fried, [place], MSW, [email or web address] Phone: [phone]

## 2018-12-23 ENCOUNTER — Other Ambulatory Visit: Payer: Self-pay

## 2018-12-23 ENCOUNTER — Encounter: Payer: Self-pay | Admitting: Family Medicine

## 2018-12-23 ENCOUNTER — Ambulatory Visit (INDEPENDENT_AMBULATORY_CARE_PROVIDER_SITE_OTHER): Payer: Medicaid Other | Admitting: Family Medicine

## 2018-12-23 VITALS — Wt 111.0 lb

## 2018-12-23 DIAGNOSIS — L723 Sebaceous cyst: Secondary | ICD-10-CM | POA: Diagnosis not present

## 2018-12-23 DIAGNOSIS — L089 Local infection of the skin and subcutaneous tissue, unspecified: Secondary | ICD-10-CM

## 2018-12-23 DIAGNOSIS — F331 Major depressive disorder, recurrent, moderate: Secondary | ICD-10-CM

## 2018-12-23 MED ORDER — FLUOXETINE HCL 40 MG PO CAPS: 40.0000 mg | ORAL_CAPSULE | Freq: Every day | ORAL | 1 refills | Status: DC

## 2018-12-23 NOTE — Assessment & Plan Note (Signed)
Doing much better on current regimen. Continue current regimen. Continue to monitor. Call with any concerns.

## 2018-12-23 NOTE — Progress Notes (Signed)
Wt 111 lb (50.3 kg)   LMP 06/17/2011   BMI 19.05 kg/m    Subjective:    Patient ID: Jill Poole, female    DOB: Sep 01, 1961, 58 y.o.   MRN: [id number]  HPI: Noele Icenhour is a 58 y.o. female  Chief Complaint  Patient presents with  . Depression   Cyst is doing much better. No more swelling. No more oozing. No pus. No more lump at the back of her head.   DEPRESSION Mood status: better Satisfied with current treatment?: yes Symptom severity: mild  Duration of current treatment : chronic Side effects: no Medication compliance: excellent compliance Psychotherapy/counseling: yes current Previous psychiatric medications: prozac, hydroxyzine, lorazepam Depressed mood: no Anxious mood: no Anhedonia: no Significant weight loss or gain: no Insomnia: no  Fatigue: no Feelings of worthlessness or guilt: no Impaired concentration/indecisiveness: no Suicidal ideations: no Hopelessness: no Crying spells: no Depression screen [facility] 2/9 12/23/2018 12/05/2018 06/06/2018 04/01/2018 11/29/2017  Decreased Interest 0 3 0 2 0  Down, Depressed, Hopeless 0 3 0 0 0  PHQ - 2 Score 0 6 0 2 0  Altered sleeping 0 3 2 0 3  Tired, decreased energy 0 3 0 0 0  Change in appetite 0 3 0 0 3  Feeling bad or failure about yourself  0 2 0 0 0  Trouble concentrating 0 3 0 1 0  Moving slowly or fidgety/restless 0 3 0 0 0  Suicidal thoughts 0 1 0 0 0  PHQ-9 Score 0 24 2 3 6   Difficult doing work/chores - - - Somewhat difficult -   Relevant past medical, surgical, family and social history reviewed and updated as indicated. Interim medical history since our last visit reviewed. Allergies and medications reviewed and updated.  Review of Systems  Constitutional: Negative.   Respiratory: Negative.   Cardiovascular: Negative.   Musculoskeletal: Negative.   Skin: Negative.   Neurological: Negative.   Psychiatric/Behavioral: Negative.     Per HPI unless specifically indicated above     Objective:     Wt 111 lb (50.3 kg)   LMP 06/17/2011   BMI 19.05 kg/m   Wt Readings from Last 3 Encounters:  12/23/18 111 lb (50.3 kg)  12/15/18 110 lb (49.9 kg)  12/05/18 110 lb (49.9 kg)    Physical Exam Vitals signs and nursing note reviewed.  Pulmonary:     Effort: Pulmonary effort is normal. No respiratory distress.     Comments: Speaking in full sentences Neurological:     Mental Status: She is alert.  Psychiatric:        Mood and Affect: Mood normal.        Behavior: Behavior normal.        Thought Content: Thought content normal.        Judgment: Judgment normal.     Results for orders placed or performed in visit on 06/06/18  Urine Culture  Result Value Ref Range   Urine Culture, Routine Final report (A)    Organism ID, Bacteria Klebsiella pneumoniae (A)    Antimicrobial Susceptibility Comment   Microscopic Examination  Result Value Ref Range   WBC, UA >30 (A) 0 - 5 /hpf   RBC, UA 0-2 0 - 2 /hpf   Epithelial Cells (non renal) 0-10 0 - 10 /hpf   Bacteria, UA Moderate (A) None seen/Few  CBC with Differential/Platelet  Result Value Ref Range   WBC 7.1 3.4 - 10.8 x10E3/uL   RBC 3.65 (L) 3.77 - 5.28 x10E6/uL  Hemoglobin 13.4 11.1 - 15.9 g/dL   Hematocrit 36.9 34.0 - 46.6 %   MCV 101 (H) 79 - 97 fL   MCH 36.7 (H) 26.6 - 33.0 pg   MCHC 36.3 (H) 31.5 - 35.7 g/dL   RDW 13.1 12.3 - 15.4 %   Platelets 208 150 - 450 x10E3/uL   Neutrophils 72 Not Estab. %   Lymphs 19 Not Estab. %   Monocytes 8 Not Estab. %   Eos 0 Not Estab. %   Basos 1 Not Estab. %   Neutrophils Absolute 5.0 1.4 - 7.0 x10E3/uL   Lymphocytes Absolute 1.4 0.7 - 3.1 x10E3/uL   Monocytes Absolute 0.6 0.1 - 0.9 x10E3/uL   EOS (ABSOLUTE) 0.0 0.0 - 0.4 x10E3/uL   Basophils Absolute 0.1 0.0 - 0.2 x10E3/uL   Immature Granulocytes 0 Not Estab. %   Immature Grans (Abs) 0.0 0.0 - 0.1 x10E3/uL  Comprehensive metabolic panel  Result Value Ref Range   Glucose 98 65 - 99 mg/dL   BUN 5 (L) 6 - 24 mg/dL   Creatinine, Ser  0.73 0.57 - 1.00 mg/dL   GFR calc non Af Amer 93 >59 mL/min/1.73   GFR calc Af Amer 107 >59 mL/min/1.73   BUN/Creatinine Ratio 7 (L) 9 - 23   Sodium 133 (L) 134 - 144 mmol/L   Potassium 4.2 3.5 - 5.2 mmol/L   Chloride 90 (L) 96 - 106 mmol/L   CO2 22 20 - 29 mmol/L   Calcium 9.6 8.7 - 10.2 mg/dL   Total Protein 7.0 6.0 - 8.5 g/dL   Albumin 4.4 3.5 - 5.5 g/dL   Globulin, Total 2.6 1.5 - 4.5 g/dL   Albumin/Globulin Ratio 1.7 1.2 - 2.2   Bilirubin Total 0.5 0.0 - 1.2 mg/dL   Alkaline Phosphatase 122 (H) 39 - 117 IU/L   AST 71 (H) 0 - 40 IU/L   ALT 42 (H) 0 - 32 IU/L  Lipid Panel w/o Chol/HDL Ratio  Result Value Ref Range   Cholesterol, Total 219 (H) 100 - 199 mg/dL   Triglycerides 50 0 - 149 mg/dL   HDL 143 >39 mg/dL   VLDL Cholesterol Cal 10 5 - 40 mg/dL   LDL Calculated 66 0 - 99 mg/dL  Microalbumin, Urine Waived  Result Value Ref Range   Microalb, Ur Waived 80 (H) 0 - 19 mg/L   Creatinine, Urine Waived 200 10 - 300 mg/dL   Microalb/Creat Ratio 30-300 (H) <30 mg/g  TSH  Result Value Ref Range   TSH 1.290 0.450 - 4.500 uIU/mL  UA/M w/rflx Culture, Routine  Result Value Ref Range   Specific Gravity, UA 1.015 1.005 - 1.030   pH, UA 6.0 5.0 - 7.5   Color, UA Yellow Yellow   Appearance Ur Cloudy (A) Clear   Leukocytes, UA 1+ (A) Negative   Protein, UA Trace (A) Negative/Trace   Glucose, UA Negative Negative   Ketones, UA Negative Negative   RBC, UA 1+ (A) Negative   Urobilinogen, Ur 0.2 0.2 - 1.0 mg/dL   Nitrite, UA Positive (A) Negative   Microscopic Examination See below:   Cytology - PAP  Result Value Ref Range   Adequacy      Satisfactory for evaluation  endocervical/transformation zone component PRESENT.   Diagnosis      NEGATIVE FOR INTRAEPITHELIAL LESIONS OR MALIGNANCY.   HPV 16/18/45 genotyping NEGATIVE for HPV 16 & 18/45    HPV DETECTED (A)    Material Submitted CervicoVaginal Pap [ThinPrep Imaged]  Assessment & Plan:   Problem List Items Addressed  This Visit      Other   Moderate episode of recurrent major depressive disorder ([place]) - Primary    Doing much better on current regimen. Continue current regimen. Continue to monitor. Call with any concerns.       Relevant Medications   FLUoxetine (PROZAC) 40 MG capsule    Other Visit Diagnoses    Infected sebaceous cyst       Resolved. Feeling better. Call with any concerns.        Follow up plan: Return August, for 6 month follow up.   . This visit was completed via telephone due to the restrictions of the COVID-19 pandemic. All issues as above were discussed and addressed but no physical exam was performed. If it was felt that the patient should be evaluated in the office, they were directed there. The patient verbally consented to this visit. Patient was unable to complete an audio/visual visit due to Lack of equipment. Due to the catastrophic nature of the COVID-19 pandemic, this visit was done through audio contact only. . Location of the patient: home . Location of the provider: home . Those involved with this call:  . Provider: Park Liter, DO . CMA: Tiffany Reel, CMA . Front Desk/Registration: Don Perking  . Time spent on call: 15 minutes on the phone discussing health concerns. 23 minutes total spent in review of patient's record and preparation of their chart.

## 2018-12-25 ENCOUNTER — Telehealth: Payer: Self-pay | Admitting: Family Medicine

## 2018-12-25 ENCOUNTER — Other Ambulatory Visit: Payer: Self-pay | Admitting: Family Medicine

## 2018-12-25 NOTE — Telephone Encounter (Signed)
They did receive the one sent in on 12/05/18, and she picked it up the same day.

## 2018-12-25 NOTE — Telephone Encounter (Signed)
Pt called to check on the status of this.  States pharmacy doesn't have anything on file for her.

## 2018-12-25 NOTE — Telephone Encounter (Signed)
Pt.notified

## 2018-12-25 NOTE — Telephone Encounter (Signed)
Copied from [place] [phone]. Topic: Quick Communication - Rx Refill/Question >> Dec 25, 2018  4:53 PM Rayann Heman wrote: Medication:LORazepam (ATIVAN) 0.5 MG tablet [[id number]]   Has the patient contacted their pharmacy?  Preferred Pharmacy (with phone number or street name): [id number] - [place], [place]. [address] [phone] (Phone) [phone] (Fax)    Agent: Please be advised that RX refills may take up to 3 business days. We ask that you follow-up with your pharmacy.

## 2018-12-25 NOTE — Telephone Encounter (Signed)
Attempted to notify pt x 2, line was busy.

## 2018-12-25 NOTE — Telephone Encounter (Signed)
Rx was called in 2 weeks ago. She cannot have a refill early, but if they never got it on 4/3, I'm happy to call it back in- can we check?

## 2019-01-05 ENCOUNTER — Ambulatory Visit: Payer: Self-pay | Admitting: Licensed Clinical Social Worker

## 2019-01-05 ENCOUNTER — Telehealth: Payer: Self-pay

## 2019-01-05 NOTE — Chronic Care Management (AMB) (Signed)
  Chronic Care Management    Clinical Social Work Follow Up Note  01/05/2019 Name: Jill Poole MRN: [id number] DOB: Feb 24, 1961  Jill Poole is a 58 y.o. year old female who is a primary care patient of Valerie Roys, DO. The CCM team was consulted for assistance with Grief Counseling. LCSW completed outreach to follow up on grief counseling but was unable to reach patient successfully. HIPPA compliant voice message left encouraging patient to return call once available.   Review of patient status, including review of consultants reports, other relevant assessments, and collaboration with appropriate care team members and the patient's provider was performed as part of comprehensive patient evaluation and provision of chronic care management services.     Follow Up Plan: SW will follow up with patient by phone over the next 10 days  Eula Fried, [place], MSW, [email or web address] Phone: [phone]

## 2019-01-07 ENCOUNTER — Telehealth: Payer: Self-pay | Admitting: Family Medicine

## 2019-01-07 ENCOUNTER — Emergency Department
Admission: EM | Admit: 2019-01-07 | Discharge: 2019-01-07 | Discharge status: Against Medical Advice | Payer: Medicaid Other | Attending: Emergency Medicine | Admitting: Emergency Medicine

## 2019-01-07 ENCOUNTER — Other Ambulatory Visit: Payer: Self-pay

## 2019-01-07 ENCOUNTER — Emergency Department: Payer: Medicaid Other

## 2019-01-07 ENCOUNTER — Ambulatory Visit: Payer: Self-pay

## 2019-01-07 DIAGNOSIS — Z9114 Patient's other noncompliance with medication regimen: Secondary | ICD-10-CM | POA: Insufficient documentation

## 2019-01-07 DIAGNOSIS — R531 Weakness: Secondary | ICD-10-CM

## 2019-01-07 DIAGNOSIS — R42 Dizziness and giddiness: Secondary | ICD-10-CM | POA: Diagnosis not present

## 2019-01-07 DIAGNOSIS — F17210 Nicotine dependence, cigarettes, uncomplicated: Secondary | ICD-10-CM | POA: Diagnosis not present

## 2019-01-07 DIAGNOSIS — R51 Headache: Secondary | ICD-10-CM | POA: Diagnosis not present

## 2019-01-07 DIAGNOSIS — Z5329 Procedure and treatment not carried out because of patient's decision for other reasons: Secondary | ICD-10-CM | POA: Diagnosis not present

## 2019-01-07 DIAGNOSIS — R200 Anesthesia of skin: Secondary | ICD-10-CM | POA: Insufficient documentation

## 2019-01-07 DIAGNOSIS — I10 Essential (primary) hypertension: Secondary | ICD-10-CM | POA: Insufficient documentation

## 2019-01-07 DIAGNOSIS — J449 Chronic obstructive pulmonary disease, unspecified: Secondary | ICD-10-CM | POA: Insufficient documentation

## 2019-01-07 DIAGNOSIS — Z79899 Other long term (current) drug therapy: Secondary | ICD-10-CM | POA: Diagnosis not present

## 2019-01-07 DIAGNOSIS — Z7982 Long term (current) use of aspirin: Secondary | ICD-10-CM | POA: Diagnosis not present

## 2019-01-07 LAB — CBC
HCT: 40.1 % (ref 36.0–46.0)
Hemoglobin: 14.3 g/dL (ref 12.0–15.0)
MCH: 35.3 pg — ABNORMAL HIGH (ref 26.0–34.0)
MCHC: 35.7 g/dL (ref 30.0–36.0)
MCV: 99 fL (ref 80.0–100.0)
Platelets: 156 10*3/uL (ref 150–400)
RBC: 4.05 MIL/uL (ref 3.87–5.11)
RDW: 13.5 % (ref 11.5–15.5)
WBC: 4.8 10*3/uL (ref 4.0–10.5)
nRBC: 0.4 % — ABNORMAL HIGH (ref 0.0–0.2)

## 2019-01-07 LAB — DIFFERENTIAL
Abs Immature Granulocytes: 0.01 10*3/uL (ref 0.00–0.07)
Basophils Absolute: 0.1 10*3/uL (ref 0.0–0.1)
Basophils Relative: 1 %
Eosinophils Absolute: 0.1 10*3/uL (ref 0.0–0.5)
Eosinophils Relative: 3 %
Immature Granulocytes: 0 %
Lymphocytes Relative: 40 %
Lymphs Abs: 1.9 10*3/uL (ref 0.7–4.0)
Monocytes Absolute: 0.4 10*3/uL (ref 0.1–1.0)
Monocytes Relative: 9 %
Neutro Abs: 2.3 10*3/uL (ref 1.7–7.7)
Neutrophils Relative %: 47 %

## 2019-01-07 LAB — PROTIME-INR
INR: 0.9 (ref 0.8–1.2)
Prothrombin Time: 12 seconds (ref 11.4–15.2)

## 2019-01-07 LAB — COMPREHENSIVE METABOLIC PANEL
ALT: 31 U/L (ref 0–44)
AST: 59 U/L — ABNORMAL HIGH (ref 15–41)
Albumin: 4.7 g/dL (ref 3.5–5.0)
Alkaline Phosphatase: 127 U/L — ABNORMAL HIGH (ref 38–126)
Anion gap: 13 (ref 5–15)
BUN: <5 mg/dL — ABNORMAL LOW (ref 6–20)
CO2: 25 mmol/L (ref 22–32)
Calcium: 9.2 mg/dL (ref 8.9–10.3)
Chloride: 93 mmol/L — ABNORMAL LOW (ref 98–111)
Creatinine, Ser: 0.61 mg/dL (ref 0.44–1.00)
GFR calc Af Amer: >60 mL/min (ref >60)
GFR calc non Af Amer: >60 mL/min (ref >60)
Glucose, Bld: 105 mg/dL — ABNORMAL HIGH (ref 70–99)
Potassium: 3.4 mmol/L — ABNORMAL LOW (ref 3.5–5.1)
Sodium: 131 mmol/L — ABNORMAL LOW (ref 135–145)
Total Bilirubin: 0.6 mg/dL (ref 0.3–1.2)
Total Protein: 9 g/dL — ABNORMAL HIGH (ref 6.5–8.1)

## 2019-01-07 LAB — APTT: aPTT: 27 seconds (ref 24–36)

## 2019-01-07 LAB — GLUCOSE, CAPILLARY: Glucose-Capillary: 125 mg/dL — ABNORMAL HIGH (ref 70–99)

## 2019-01-07 MED ORDER — PROCHLORPERAZINE EDISYLATE 10 MG/2ML IJ SOLN: 10.0000 mg | Freq: Once | INTRAMUSCULAR | Status: DC

## 2019-01-07 MED ORDER — SODIUM CHLORIDE 0.9% FLUSH: 3.0000 mL | Freq: Once | INTRAVENOUS | Status: DC

## 2019-01-07 MED ORDER — SODIUM CHLORIDE 0.9 % IV BOLUS: 1000.0000 mL | Freq: Once | INTRAVENOUS | Status: DC

## 2019-01-07 NOTE — ED Notes (Signed)
Pt states she is tired of making her friend wait outside and wanted to leave

## 2019-01-07 NOTE — ED Provider Notes (Signed)
[facility] Emergency Department Provider Note  ____________________________________________   I have reviewed the triage vital signs and the nursing notes.   HISTORY  Chief Complaint Weakness   History limited by: Not Limited   HPI Jill Poole is a 58 y.o. female who presents to the emergency department today because of concern for left sided weakness and numbness. The patient states that these symptoms have been present for the past 2 weeks. She states that prior to that the symptoms had been intermittent but for the past 2 weeks they have been constant. The patient describes numbness to the left side of her face, left arm and left leg, with weakness to her left arm and leg. The patient says that her blood pressure has also been high recently and that she has not had her blood pressure medication for a little while.  Records reviewed. Per medical record review patient has a history of asthma, depression.   Past Medical History:  Diagnosis Date  . Asthma   . Back pain   . Concussion   . COPD (chronic obstructive pulmonary disease) ([place])   . DDD (degenerative disc disease), lumbar   . Depression   . Post traumatic stress disorder (PTSD)     Patient Active Problem List   Diagnosis Date Noted  . Moderate episode of recurrent major depressive disorder ([place]) 12/23/2018  . HTN (hypertension) 04/01/2018  . GERD (gastroesophageal reflux disease) 02/25/2017  . Rosacea 09/14/2016  . Insomnia 09/14/2016  . 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 DDD (degenerative disc disease), lumbar   . COPD (chronic obstructive pulmonary disease) ([place])   . Anxiety 02/29/2016  . Infantile idiopathic scoliosis of thoracolumbar region 02/29/2016  . Carpal tunnel syndrome on left 09/15/2014  . Smoking 09/15/2014  . Left leg weakness 03/18/2014  . Lumbar radiculopathy 03/18/2014  . Nicotine dependence 12/02/2013  . Sciatica 12/02/2013    Past Surgical History:  Procedure  Laterality Date  . TIBIA FRACTURE SURGERY    . TUBAL LIGATION    . WRIST SURGERY      Prior to Admission medications   Medication Sig Start Date End Date Taking? Authorizing Provider  albuterol (PROVENTIL HFA) 108 (90 Base) MCG/ACT inhaler INHALE 2 PUFFS BY MOUTH EVERY 6 HOURS AS NEEDED FOR WHEEZE OR SHORTNESS OF BREATH 10/17/18   Johnson, Megan P, DO  aspirin 81 MG tablet Take 81 mg by mouth daily.    [provider]  cyclobenzaprine (FLEXERIL) 10 MG tablet Take 1 tablet (10 mg total) by mouth at bedtime. 10/17/18   Johnson, Megan P, DO  FLUoxetine (PROZAC) 40 MG capsule Take 1 capsule (40 mg total) by mouth daily. 12/23/18   Johnson, Megan P, DO  Fluticasone-Salmeterol (ADVAIR) 250-50 MCG/DOSE AEPB Inhale 1 puff into the lungs 2 (two) times daily. 10/17/18   Johnson, Megan P, DO  gabapentin (NEURONTIN) 300 MG capsule Take 1 capsule (300 mg total) by mouth 3 (three) times daily for 30 days. 10/17/18 11/16/18  Park Liter P, DO  hydrOXYzine (ATARAX/VISTARIL) 25 MG tablet Take 1-2 tablets (25-50 mg total) by mouth every 8 (eight) hours as needed. 10/17/18   Johnson, Megan P, DO  LORazepam (ATIVAN) 0.5 MG tablet Take 1 tablet (0.5 mg total) by mouth 2 (two) times daily as needed for anxiety. 12/05/18   Johnson, Megan P, DO  naproxen (NAPROSYN) 500 MG tablet Take 1 tablet (500 mg total) by mouth 2 (two) times daily with a meal. 10/17/18   Johnson, Megan P, DO  omeprazole (PRILOSEC) 40 MG capsule Take 1 capsule (40 mg total) by mouth daily. 11/05/18   Johnson, Megan P, DO  traZODone (DESYREL) 100 MG tablet TAKE 1 TABLET(100 MG) BY MOUTH AT BEDTIME 10/17/18   Johnson, Megan P, DO  triamcinolone cream (KENALOG) 0.1 % APPLY TOPICALLY TO THE AFFECTED AREA AS DIRECTED 04/03/18   Park Liter P, DO    Allergies Adenosine; Tramadol; Codeine; and Hydrocodone-acetaminophen  Family History  Problem Relation Age of Onset  . Arthritis Mother   . Hypertension Mother   . Hypertension Sister   . Diabetes  Sister   . Cancer Sister   . Heart disease Brother   . Asthma Brother   . Other Son        Lipoma  . Cancer Maternal Grandmother        Stomach  . Breast cancer Maternal Aunt 65    Social History Social History   Tobacco Use  . Smoking status: Current Every Day Smoker    Packs/day: 0.50    Types: Cigarettes  . Smokeless tobacco: Never Used  Substance Use Topics  . Alcohol use: Yes    Alcohol/week: 6.0 standard drinks    Types: 6 Glasses of wine per week    Comment: once every two weeks  . Drug use: No    Review of Systems Constitutional: No fever/chills Eyes: No visual changes. ENT: No sore throat. Cardiovascular: Denies chest pain. Respiratory: Denies shortness of breath. Gastrointestinal: No abdominal pain.  No nausea, no vomiting.  No diarrhea.   Genitourinary: Negative for dysuria. Musculoskeletal: Negative for back pain. Skin: Negative for rash. Neurological: Positive for headache. Positive for left sided numbness and weakness.   ____________________________________________   PHYSICAL EXAM:  VITAL SIGNS: ED Triage Vitals  Enc Vitals Group     BP 01/07/19 1457 (!) 160/113     Pulse Rate 01/07/19 1457 85     Resp --      Temp 01/07/19 1501 98.3 F (36.8 C)     Temp Source 01/07/19 1501 Oral     SpO2 01/07/19 1457 96 %     Weight 01/07/19 1458 110 lb (49.9 kg)     Height 01/07/19 1458 5\' 3"  (1.6 m)     Head Circumference --      Peak Flow --      Pain Score 01/07/19 1457 5   Constitutional: Alert and oriented.  Eyes: Conjunctivae are normal.  ENT      Head: Normocephalic and atraumatic.      Nose: No congestion/rhinnorhea.      Mouth/Throat: Mucous membranes are moist.      Neck: No stridor. Hematological/Lymphatic/Immunilogical: No cervical lymphadenopathy. Cardiovascular: Normal rate, regular rhythm.  No murmurs, rubs, or gallops.  Respiratory: Normal respiratory effort without tachypnea nor retractions. Breath sounds are clear and equal  bilaterally. No wheezes/rales/rhonchi. Gastrointestinal: Soft and non tender. No rebound. No guarding.  Genitourinary: Deferred Musculoskeletal: Normal range of motion in all extremities. No lower extremity edema. Neurologic:  Normal speech and language. EOMI. PERRL. No pronator drift bilaterally. 4+/5 grip strength on left, 5/5 grip strength on right. 5/5 strength in bilateral lower extremities. Sensation grossly intact over extremities.   Skin:  Skin is warm, dry and intact. No rash noted. Psychiatric: Mood and affect are normal. Speech and behavior are normal. Patient exhibits appropriate insight and judgment.  ____________________________________________    LABS (pertinent positives/negatives)  CBC wbc 4.8, hgb 14.3, plt 156 CMP na 131, k 3.4, glu 105, cr 0.61  ____________________________________________   EKG  INance Pear, attending physician, personally viewed and interpreted this EKG  EKG Time: 1450 Rate: 82 Rhythm: normal sinus rhythm Axis: normal Intervals: qtc 470 QRS: narrow, RSR' in v1 ST changes: no st elevation Impression: abnormal ekg  ____________________________________________    RADIOLOGY  CT head No acute findings  ____________________________________________   PROCEDURES  Procedures  ____________________________________________   INITIAL IMPRESSION / ASSESSMENT AND PLAN / ED COURSE  Pertinent labs & imaging results that were available during my care of the patient were reviewed by me and considered in my medical decision making (see chart for details).   Patient presented to the emergency department today because of concern for left sided weakness and numbness. Patient states that the symptoms have been ongoing for the past 2 weeks. Patient ct negative for subacute stroke or lesion that would explain broad neurologic symptoms. Would expect some ct finding given length of symptoms. Given headache did wonder if possible related to  complex migraine. Additionally patients sodium was slightly low. Did want to try IVF and compazine and reassess. However prior to therapy patient eloped. I was not able to discuss with patient risks and benefits of leaving.   ___________________________________________   FINAL CLINICAL IMPRESSION(S) / ED DIAGNOSES  Final diagnoses:  Weakness     Note: This dictation was prepared with Dragon dictation. Any transcriptional errors that result from this process are unintentional     Nance Pear, MD 01/07/19 1542
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
## 2019-01-07 NOTE — ED Notes (Addendum)
Pt told tech she wanted to go to the exit and tech escorted her back to her room- this RN to room to speak with pt- pt had disconnected everything- tech reconnected pt

## 2019-01-07 NOTE — ED Notes (Addendum)
Pt walked to desk and requested that IV be removed and stated that she was going home - Dr Archie Balboa notified and pt eloped in the process - no vitals obtained and unable to discuss risk of leaving

## 2019-01-07 NOTE — Telephone Encounter (Signed)
Called pt to schedule virtual appt, no answer, left voicemail

## 2019-01-07 NOTE — ED Notes (Signed)
Pt removed all monitoring equipment and reports that she is ready to go home - equipment reapplied and pt persuaded to stay until she could she the provider - she states that she is drunk and needs to go home - Dr Archie Balboa made aware

## 2019-01-07 NOTE — ED Triage Notes (Signed)
Pt reports dizziness, L sided numbness, and L sided weakness x approx 1.5 hrs.

## 2019-01-07 NOTE — Telephone Encounter (Signed)
Agree with plan of care. Need for further stroke evaluation in ER and emergent transport.

## 2019-01-07 NOTE — Telephone Encounter (Signed)
I do not see discussion of this in recent note with Dr. Wynetta Emery.  I read ER note and she eloped prior to discussion of treatment with ER doctor today, scans were negative but they were going to provide her with IV fluids.  I would recommend seeing if patient can do visit tomorrow virtually with Dr. Wynetta Emery, so they can discuss further her anxiety and mood concerns.  That way is Xanax is appropriate it can be prescribed.  On review of previous triage note it was also reported patient had been drinking alcohol this morning, concern for prescribing benzo without visit.

## 2019-01-07 NOTE — ED Triage Notes (Signed)
Pt reports having weakness on her left side for a couple weeks but states it got worse today- called EMS about a hour ago but did not want to come to the hospital- states the numbness comes and goes
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
## 2019-01-12 ENCOUNTER — Other Ambulatory Visit: Payer: Self-pay

## 2019-01-12 ENCOUNTER — Ambulatory Visit (INDEPENDENT_AMBULATORY_CARE_PROVIDER_SITE_OTHER): Payer: Medicaid Other | Admitting: Family Medicine

## 2019-01-12 ENCOUNTER — Encounter: Payer: Self-pay | Admitting: Family Medicine

## 2019-01-12 ENCOUNTER — Telehealth: Payer: Self-pay | Admitting: Family Medicine

## 2019-01-12 ENCOUNTER — Telehealth: Payer: Self-pay

## 2019-01-12 VITALS — BP 165/93 | HR 89 | Temp 96.8°F | Wt 113.0 lb

## 2019-01-12 DIAGNOSIS — I10 Essential (primary) hypertension: Secondary | ICD-10-CM

## 2019-01-12 DIAGNOSIS — F419 Anxiety disorder, unspecified: Secondary | ICD-10-CM

## 2019-01-12 DIAGNOSIS — F331 Major depressive disorder, recurrent, moderate: Secondary | ICD-10-CM | POA: Diagnosis not present

## 2019-01-12 MED ORDER — LISINOPRIL 20 MG PO TABS: 20.0000 mg | ORAL_TABLET | Freq: Every day | ORAL | 3 refills | Status: DC

## 2019-01-12 MED ORDER — LORAZEPAM 0.5 MG PO TABS: 0.5000 mg | ORAL_TABLET | Freq: Two times a day (BID) | ORAL | 0 refills | Status: DC | PRN

## 2019-01-12 MED ORDER — FLUOXETINE HCL 40 MG PO CAPS: 80.0000 mg | ORAL_CAPSULE | Freq: Every day | ORAL | 1 refills | Status: DC

## 2019-01-12 NOTE — Telephone Encounter (Signed)
Receipt was confirmed by pharmacy- please confirm that they do no have this, and if they do not call them in. Thanks.

## 2019-01-12 NOTE — Telephone Encounter (Signed)
Spoke with pharmacy. She picked up her Lorazepam and fluoxetine, Did not pick up lisinopril, but they did receive it.

## 2019-01-12 NOTE — Assessment & Plan Note (Signed)
Not doing well. Will increase her prozac and give 1 month of lorazepam. Recheck 2 weeks. 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 Call with any concerns.

## 2019-01-12 NOTE — Telephone Encounter (Signed)
Copied from [place] [id number]. Topic: Quick Communication - Rx Refill/Question >> Jan 12, 2019 12:16 PM Rainey Pines A wrote: Medication: LORazepam (ATIVAN) 0.5 MG tablet ,lisinopril (ZESTRIL) 20 MG tablet (Patient stated that pharmacy needed medications sent over again.)  Has the patient contacted their pharmacy? Yes  (Agent: If no, request that the patient contact the pharmacy for the refill.) (Agent: If yes, when and what did the pharmacy advise?)Contact PCP  Preferred Pharmacy (with phone number or street name):[id number] - [place], [place] - [address] [phone] (Phone) [phone] (Fax)    Agent: Please be advised that RX refills may take up to 3 business days. We ask that you follow-up with your pharmacy.
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
## 2019-01-12 NOTE — Assessment & Plan Note (Signed)
Has not been taking her blood pressure medicine. Will restart her lisinopril and increase it to 20mg  and recheck 2 weeks. Call with any concerns.

## 2019-01-13 ENCOUNTER — Encounter: Payer: Self-pay | Admitting: *Deleted

## 2019-01-16 ENCOUNTER — Telehealth: Payer: Self-pay

## 2019-01-20 ENCOUNTER — Ambulatory Visit: Payer: Self-pay | Admitting: Licensed Clinical Social Worker

## 2019-01-20 ENCOUNTER — Other Ambulatory Visit: Payer: Self-pay

## 2019-01-20 DIAGNOSIS — F331 Major depressive disorder, recurrent, moderate: Secondary | ICD-10-CM

## 2019-01-20 DIAGNOSIS — F4321 Adjustment disorder with depressed mood: Secondary | ICD-10-CM

## 2019-01-20 NOTE — Chronic Care Management (AMB) (Signed)
  Chronic Care Management    Clinical Social Work General Note  01/20/2019 Name: Jill Poole MRN: [id number] DOB: 1961-07-24  Jill Poole is a 58 y.o. year old female who is a primary care patient of Valerie Roys, DO. The CCM was consulted to assist the patient with Grief Counseling.   Review of patient status, including review of consultants reports, relevant laboratory and other test results, and collaboration with appropriate care team members and the patient's provider was performed as part of comprehensive patient evaluation and provision of chronic care management services.    Goals Addressed    . "I need help with grief" (pt-stated)       Current Barriers:  Marland Kitchen Mental Health Concerns - patient requests grief counseling resources (recent loss of loved one by violence)  Clinical Social Work Clinical Goal(s):  Marland Kitchen Over the next 90 days, client will work with SW to address concerns related to grief counseling - LCSW to follow up with patient over the next 90 days to ensure that patient is connected to community grief counseling resource.  . Over the next 90 days, patient will work with community grief counselor [place] [facility] [phone] to address needs related to grief/loss.  Interventions: . Patient interviewed and appropriate assessments performed . Provided patient with information about community grief counseling resources . Discussed plans with patient for ongoing care management follow up and provided patient with direct contact information for care management team . Advised patient to to keep up with future grief counseling appointments. Patient admits that she missed her grief counselor's last phone call because she was not at a home. Patient was encouraged to contact [facility] back and reschedule session.  . Assisted patient/caregiver with obtaining information about health plan benefits . Collaborated with CCM Pharmacist and RNCM  in regards to patient's blood pressure concerns. Patient reports that she would like more education on how to manage her health conditions. She reports that her PCP increased her blood pressure medication during last visit. Patient's Prozac was also increased.   Patient Self Care Activities:  . Currently UNABLE TO independently self manage grief process  Please see past updates related to this goal by clicking on the "Past Updates" button in the selected goal   Follow Up Plan: SW will follow up with patient by phone over the next 2-4 weeks      Eula Fried, [place], MSW, [email or web address] Phone: [phone]

## 2019-01-21 ENCOUNTER — Ambulatory Visit: Payer: Self-pay | Admitting: Pharmacist

## 2019-01-21 DIAGNOSIS — I10 Essential (primary) hypertension: Secondary | ICD-10-CM

## 2019-01-21 NOTE — Chronic Care Management (AMB) (Signed)
Chronic Care Management   Follow Up Note   01/21/2019 Name: Jalexus Brett MRN: [id number] DOB: Feb 16, 1961  Referred by: Valerie Roys, DO Reason for referral : Chronic Care Management (Medication Management)   Shirah Roseman is a 58 y.o. year old female who is a primary care patient of Valerie Roys, DO. The CCM team was consulted for assistance with chronic disease management and care coordination needs.    Received message from Eula Fried, LCSW, that patient would benefit from medication review with a pharmacist. Contacted patient telephonically today.  Review of patient status, including review of consultants reports, relevant laboratory and other test results, and collaboration with appropriate care team members and the patient's provider was performed as part of comprehensive patient evaluation and provision of chronic care management services.    Goals Addressed            This Visit's Progress     Patient Stated   . "I'm worried about my BP" (pt-stated)       Current Barriers:  . Non Adherence to prescribed medication regimen- per PCP notes, patient was previously nonadherent to lisinopril 10 mg daily;  o ED visit 01/07/2019 where BP was 160/113; at subsequent PCP visit, was 165/93. Lisinopril 20 mg daily started . Patient reported to Eula Fried, LCSW on 01/20/2019 that she was having episodes of hypotension and she was very concerned about it. She had considered stopping the medication o Reports BP readings 100-110/50-60s w/ HR 90-100s in the past few days; endorses lightheadedness, some "tiredness". She reports that her home BP reading prior to the ED visit was similar to that obtained in the ED, giving validity to these current readings o Notes that yesterday, she took lisinopril 10 mg as she was more comfortable with that . Concurrent anxiety/depression likely also playing a role. Patient notes that she is regularly taking fluoxetine 80 mg (increased on 01/12/2019),  trazodone 100 mg HS, lorazepam 0.5 mg once daily; irregularly taking hydroxyzine because "my allergies have been fine";  o Concurrent cyclobenzaprine, gabapentin for back pain, but patient notes she tries to avoid these and hydroxyzine too regularly because they make her drowsy and dizzy o Notes that she only takes Advair and albuterol PRN SOB  Pharmacist Clinical Goal(s):  Marland Kitchen Over the next 30 days, patient will work with PCP and CCM team to address needs related to appropriate medication usage  Interventions: . Comprehensive medication review performed.  . Educated patient on concept of "steady state" in regards to lisinopril, encouraged daily adherence to lisinopril 10 mg (1/2 tab) until her appointment with PCP. Encouraged to check BP daily and report readings to PCP, but that there is not a need to check multiple times a day- I believe her anxiety is playing a role in BP/HR lability  o Collaborate with PCP; will relay any alternative recommendations to patient if Dr. Wynetta Emery is not amenable to above . Reviewed PRN use of other medications. Educated on which medications can contribute to sleepiness/drowsiness, and encouraged reduced use if she felt like she was too sedated. Encouraged continued conversations with healthcare team if she had concerns with medications, as opposed to self-dose modifications  Patient Self Care Activities:  . Self administers medications, but not exactly as prescribed  Initial goal documentation        Plan: - Will outreach patient after PCP visit to support with any medication changes that are made  Catie Darnelle Maffucci, PharmD Clinical Pharmacist [place] [phone]

## 2019-01-21 NOTE — Patient Instructions (Signed)
Visit Information  Goals Addressed            This Visit's Progress     Patient Stated   . "I'm worried about my BP" (pt-stated)       Current Barriers:  . Non Adherence to prescribed medication regimen- per PCP notes, patient was previously nonadherent to lisinopril 10 mg daily;  o ED visit 01/07/2019 where BP was 160/113; at subsequent PCP visit, was 165/93. Lisinopril 20 mg daily started . Patient reported to Eula Fried, LCSW on 01/20/2019 that she was having episodes of hypotension and she was very concerned about it. She had considered stopping the medication o Reports BP readings 100-110/50-60s w/ HR 90-100s in the past few days o Notes that yesterday, she took lisinopril 10 mg as she was more comfortable with that . Concurrent anxiety/depression likely also playing a role. Patient notes that she is regularly taking fluoxetine 80 mg (increased on 01/12/2019), trazodone 100 mg HS, lorazepam 0.5 mg once daily; irregularly taking hydroxyzine because "my allergies have been fine";  o Concurrent cyclobenzaprine, gabapentin for back pain, but patient notes she tries to avoid these and hydroxyzine too regularly because they make her drowsy and dizzy o Notes that she only takes Advair and albuterol PRN SOB  Pharmacist Clinical Goal(s):  Marland Kitchen Over the next 30 days, patient will work with PCP and CCM team to address needs related to appropriate medication usage  Interventions: . Comprehensive medication review performed.  . Educated patient on concept of "steady state" in regards to lisinopril, encouraged daily adherence to lisinopril 10 mg (1/2 tab) until her appointment with PCP. Encouraged to check BP daily and report readings to PCP, but that there is not a need to check multiple times a day- I believe her anxiety is playing a role in BP/HR lability  o Collaborate with PCP; will relay any alternative recommendations to patient if Dr. Wynetta Emery is not amenable to above . Reviewed PRN use of other  medications. Educated on which medications can contribute to sleepiness/drowsiness, and encouraged reduced use if she felt like she was too sedated. Encouraged continued conversations with healthcare team if she had concerns with medications, as opposed to self-dose modificatoins  Patient Self Care Activities:  . Self administers medications, but not exactly as prescribed  Initial goal documentation        The patient verbalized understanding of instructions provided today and declined a print copy of patient instruction materials.   Plan: - Will outreach patient after PCP visit to support with any medication changes that are made  Catie Darnelle Maffucci, PharmD Clinical Pharmacist [place] [phone]

## 2019-01-23 ENCOUNTER — Other Ambulatory Visit: Payer: Self-pay

## 2019-01-23 ENCOUNTER — Ambulatory Visit: Payer: Self-pay | Admitting: Pharmacist

## 2019-01-23 ENCOUNTER — Ambulatory Visit
Admission: EM | Admit: 2019-01-23 | Discharge: 2019-01-23 | Disposition: A | Discharge status: Home / Self Care | Payer: Medicaid Other | Attending: Family Medicine | Admitting: Family Medicine

## 2019-01-23 ENCOUNTER — Encounter: Payer: Self-pay | Admitting: Emergency Medicine

## 2019-01-23 DIAGNOSIS — N390 Urinary tract infection, site not specified: Secondary | ICD-10-CM

## 2019-01-23 DIAGNOSIS — B9689 Other specified bacterial agents as the cause of diseases classified elsewhere: Secondary | ICD-10-CM | POA: Diagnosis not present

## 2019-01-23 DIAGNOSIS — F419 Anxiety disorder, unspecified: Secondary | ICD-10-CM

## 2019-01-23 DIAGNOSIS — I10 Essential (primary) hypertension: Secondary | ICD-10-CM

## 2019-01-23 LAB — URINALYSIS, COMPLETE (UACMP) WITH MICROSCOPIC
Bilirubin Urine: NEGATIVE
Glucose, UA: NEGATIVE mg/dL
Ketones, ur: NEGATIVE mg/dL
Nitrite: NEGATIVE
Protein, ur: >300 mg/dL — AB
Specific Gravity, Urine: 1.02 (ref 1.005–1.030)
WBC, UA: >50 WBC/hpf (ref 0–5)
pH: 6.5 (ref 5.0–8.0)

## 2019-01-23 MED ORDER — CEPHALEXIN 500 MG PO CAPS: 500.0000 mg | ORAL_CAPSULE | Freq: Two times a day (BID) | ORAL | 0 refills | Status: DC

## 2019-01-23 MED ORDER — ONDANSETRON 8 MG PO TBDP: 8.0000 mg | ORAL_TABLET | Freq: Three times a day (TID) | ORAL | 0 refills | Status: DC | PRN

## 2019-01-23 MED ORDER — ONDANSETRON 8 MG PO TBDP
8.0000 mg | ORAL_TABLET | Freq: Once | ORAL | Status: AC
  Administered 2019-01-23: 8 mg via ORAL

## 2019-01-23 NOTE — ED Provider Notes (Signed)
[facility]    CSN: [id number] Arrival date & time: 01/23/19  1713     History   Chief Complaint Chief Complaint  Patient presents with  . Abdominal Pain  . Emesis  . Back Pain    HPI Jill Poole is a 58 y.o. female.    Abdominal Pain  Associated symptoms: dysuria, nausea and vomiting   Associated symptoms: no fever   Emesis  Associated symptoms: abdominal pain   Associated symptoms: no fever   Back Pain  Associated symptoms: abdominal pain and dysuria   Associated symptoms: no fever   Dysuria  Pain quality:  Aching Pain severity:  Moderate Onset quality:  Sudden Duration:  5 days Timing:  Constant Progression:  Worsening Chronicity:  New Recent urinary tract infections: no   Relieved by:  None tried Ineffective treatments:  None tried Associated symptoms: abdominal pain, flank pain, nausea and vomiting   Associated symptoms: no fever     Past Medical History:  Diagnosis Date  . Asthma   . Back pain   . Concussion   . COPD (chronic obstructive pulmonary disease) ([place])   . DDD (degenerative disc disease), lumbar   . Depression   . Post traumatic stress disorder (PTSD)     Patient Active Problem List   Diagnosis Date Noted  . Moderate episode of recurrent major depressive disorder ([place]) 12/23/2018  . HTN (hypertension) 04/01/2018  . GERD (gastroesophageal reflux disease) 02/25/2017  . Rosacea 09/14/2016  . Insomnia 09/14/2016  . 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 DDD (degenerative disc disease), lumbar   . COPD (chronic obstructive pulmonary disease) ([place])   . Anxiety 02/29/2016  . Infantile idiopathic scoliosis of thoracolumbar region 02/29/2016  . Carpal tunnel syndrome on left 09/15/2014  . Smoking 09/15/2014  . Left leg weakness 03/18/2014  . Lumbar radiculopathy 03/18/2014  . Nicotine dependence 12/02/2013  . Sciatica 12/02/2013    Past Surgical History:  Procedure Laterality Date  . TIBIA FRACTURE SURGERY    . TUBAL LIGATION     . WRIST SURGERY      OB History   No obstetric history on file.      Home Medications    Prior to Admission medications   Medication Sig Start Date End Date Taking? Authorizing Provider  albuterol (PROVENTIL HFA) 108 (90 Base) MCG/ACT inhaler INHALE 2 PUFFS BY MOUTH EVERY 6 HOURS AS NEEDED FOR WHEEZE OR SHORTNESS OF BREATH 10/17/18  Yes Johnson, Megan P, DO  aspirin 81 MG tablet Take 81 mg by mouth daily.   Yes [provider]  FLUoxetine (PROZAC) 40 MG capsule Take 2 capsules (80 mg total) by mouth daily. 01/12/19  Yes Johnson, Megan P, DO  fluticasone (FLONASE) 50 MCG/ACT nasal spray Place 1 spray into both nostrils daily.   Yes [provider]  Fluticasone-Salmeterol (ADVAIR) 250-50 MCG/DOSE AEPB Inhale 1 puff into the lungs 2 (two) times daily. 10/17/18  Yes Johnson, Megan P, DO  hydrOXYzine (ATARAX/VISTARIL) 25 MG tablet Take 1-2 tablets (25-50 mg total) by mouth every 8 (eight) hours as needed. 10/17/18  Yes Johnson, Megan P, DO  lisinopril (ZESTRIL) 20 MG tablet Take 1 tablet (20 mg total) by mouth daily. 01/12/19  Yes Johnson, Megan P, DO  LORazepam (ATIVAN) 0.5 MG tablet Take 1 tablet (0.5 mg total) by mouth 2 (two) times daily as needed for anxiety. 01/12/19  Yes Johnson, Megan P, DO  omeprazole (PRILOSEC) 40 MG capsule Take 1 capsule (40 mg total) by mouth daily. 11/05/18  Yes Johnson, Megan P, DO  traZODone (DESYREL) 100 MG tablet TAKE 1 TABLET(100 MG) BY MOUTH AT BEDTIME 10/17/18  Yes Johnson, Megan P, DO  cephALEXin (KEFLEX) 500 MG capsule Take 1 capsule (500 mg total) by mouth 2 (two) times daily. 01/23/19   Norval Gable, MD  cyclobenzaprine (FLEXERIL) 10 MG tablet Take 1 tablet (10 mg total) by mouth at bedtime. Patient not taking: Reported on 01/21/2019 10/17/18   Park Liter P, DO  gabapentin (NEURONTIN) 300 MG capsule Take 1 capsule (300 mg total) by mouth 3 (three) times daily for 30 days. 10/17/18 01/21/19  Park Liter P, DO  naproxen (NAPROSYN) 500 MG  tablet Take 1 tablet (500 mg total) by mouth 2 (two) times daily with a meal. 10/17/18   Johnson, Megan P, DO  ondansetron (ZOFRAN ODT) 8 MG disintegrating tablet Take 1 tablet (8 mg total) by mouth every 8 (eight) hours as needed. 01/23/19   Norval Gable, MD    Family History Family History  Problem Relation Age of Onset  . Arthritis Mother   . Hypertension Mother   . Hypertension Sister   . Diabetes Sister   . Cancer Sister   . Heart disease Brother   . Asthma Brother   . Other Son        Lipoma  . Cancer Maternal Grandmother        Stomach  . Breast cancer Maternal Aunt 36    Social History Social History   Tobacco Use  . Smoking status: Current Every Day Smoker    Packs/day: 0.50    Types: Cigarettes  . Smokeless tobacco: Never Used  Substance Use Topics  . Alcohol use: Yes    Alcohol/week: 6.0 standard drinks    Types: 6 Glasses of wine per week    Comment: once every two weeks  . Drug use: No     Allergies   Adenosine; Tramadol; Codeine; and Hydrocodone-acetaminophen   Review of Systems Review of Systems  Constitutional: Negative for fever.  Gastrointestinal: Positive for abdominal pain, nausea and vomiting.  Genitourinary: Positive for dysuria and flank pain.  Musculoskeletal: Positive for back pain.     Physical Exam Triage Vital Signs ED Triage Vitals  Enc Vitals Group     BP 01/23/19 1754 106/89     Pulse Rate 01/23/19 1754 94     Resp 01/23/19 1754 18     Temp 01/23/19 1754 98.6 F (37 C)     Temp Source 01/23/19 1754 Oral     SpO2 01/23/19 1754 99 %     Weight 01/23/19 1755 109 lb (49.4 kg)     Height 01/23/19 1755 5\' 4"  (1.626 m)     Head Circumference --      Peak Flow --      Pain Score 01/23/19 1754 10     Pain Loc --      Pain Edu? --      Excl. in [place]? --    No data found.  Updated Vital Signs BP 106/89 (BP Location: Right Arm)   Pulse 94   Temp 98.6 F (37 C) (Oral)   Resp 18   Ht 5\' 4"  (1.626 m)   Wt 49.4 kg   LMP  06/17/2011   SpO2 99%   BMI 18.71 kg/m   Visual Acuity Right Eye Distance:   Left Eye Distance:   Bilateral Distance:    Right Eye Near:   Left Eye Near:    Bilateral Near:  Physical Exam Vitals signs and nursing note reviewed.  Constitutional:      General: She is not in acute distress.    Appearance: She is not toxic-appearing or diaphoretic.  Abdominal:     General: There is no distension.     Palpations: Abdomen is soft.     Tenderness: There is left CVA tenderness.  Neurological:     Mental Status: She is alert.      UC Treatments / Results  Labs (all labs ordered are listed, but only abnormal results are displayed) Labs Reviewed  URINALYSIS, COMPLETE (UACMP) WITH MICROSCOPIC - Abnormal; Notable for the following components:      Result Value   APPearance CLOUDY (*)    Hgb urine dipstick MODERATE (*)    Protein, ur >300 (*)    Leukocytes,Ua SMALL (*)    Bacteria, UA MANY (*)    All other components within normal limits  URINE CULTURE    EKG None  Radiology No results found.  Procedures Procedures (including critical care time)  Medications Ordered in UC Medications  ondansetron (ZOFRAN-ODT) disintegrating tablet 8 mg (8 mg Oral Given 01/23/19 1838)    Initial Impression / Assessment and Plan / UC Course  I have reviewed the triage vital signs and the nursing notes.  Pertinent labs & imaging results that were available during my care of the patient were reviewed by me and considered in my medical decision making (see chart for details).      Final Clinical Impressions(s) / UC Diagnoses   Final diagnoses:  Lower urinary tract infectious disease     Discharge Instructions     Increase water intake Go to Emergency Department if symptoms get worse    ED Prescriptions    Medication Sig Dispense Auth. Provider   cephALEXin (KEFLEX) 500 MG capsule Take 1 capsule (500 mg total) by mouth 2 (two) times daily. 14 capsule Adalea Handler, MD    ondansetron (ZOFRAN ODT) 8 MG disintegrating tablet Take 1 tablet (8 mg total) by mouth every 8 (eight) hours as needed. 6 tablet Norval Gable, MD      1. Lab results and diagnosis reviewed with patient; given zofran 8mg  odt 2. rx as per orders above; reviewed possible side effects, interactions, risks and benefits  3. Recommend supportive treatment as above  4. Follow-up prn if symptoms worsen or don't improve  Controlled Substance Prescriptions [place] Controlled Substance Registry consulted? Not Applicable   Norval Gable, MD 01/23/19 [id number]

## 2019-01-23 NOTE — ED Triage Notes (Signed)
Patient c/o abdominal pain, vomiting and back pain that started 5 days ago. Patient states her urine is cloudy and has an odor. Decreased urination today.

## 2019-01-23 NOTE — Patient Instructions (Signed)
Visit Information  Goals Addressed            This Visit's Progress     Patient Stated   . "I'm worried about my BP" (pt-stated)       Current Barriers:  . Non Adherence to prescribed medication regimen- per PCP notes, patient was previously nonadherent to lisinopril 10 mg daily;  o ED visit 01/07/2019 where BP was 160/113; at subsequent PCP visit, was 165/93. Lisinopril 20 mg daily started . Patient reported to Eula Fried, LCSW on 01/20/2019 that she was having episodes of hypotension and she was very concerned about it. She had considered stopping the medication o Today, reports that with 2 days of lisinopril 10 mg, SBP was still in the 80-90s. She started taking lisinopril 5 mg yesterday, and plans to continue this until her appointment next week.  . Concurrent anxiety/depression likely also playing a role. Patient notes that she is regularly taking fluoxetine 80 mg (increased on 01/12/2019), trazodone 100 mg HS, lorazepam 0.5 mg once daily; irregularly taking hydroxyzine because "my allergies have been fine";  o Concurrent cyclobenzaprine, gabapentin for back pain, but patient notes she tries to avoid these and hydroxyzine too regularly because they make her drowsy and dizzy o Notes that she only takes Advair and albuterol PRN SOB  Pharmacist Clinical Goal(s):  Marland Kitchen Over the next 30 days, patient will work with PCP and CCM team to address needs related to appropriate medication usage  Interventions: . Encouraged patient to continue taking lisinopril 5 mg daily, and to bring all BP readings to her upcoming appointment with PCP. She verbalized understanding.  . Reminded that hydroxyzine may help with anxiety, and that she can try taking this medication more regularly to help reduce her need to take lorazepam. She verbalized understanding.   Patient Self Care Activities:  . Self administers medications, but not exactly as prescribed  Please see past updates related to this goal by clicking  on the "Past Updates" button in the selected goal         The patient verbalized understanding of instructions provided today and declined a print copy of patient instruction materials.   Plan:  - Patient has my contact information for any future medication-related questions or concerns.   Catie Darnelle Maffucci, PharmD Clinical Pharmacist [place] [phone]

## 2019-01-23 NOTE — Chronic Care Management (AMB) (Signed)
  Chronic Care Management   Follow Up Note   01/23/2019 Name: Jill Poole MRN: [id number] DOB: [birth year]  Referred by: Valerie Roys, DO Reason for referral : Chronic Care Management (Medication Management)   Jill Poole is a 58 y.o. year old female who is a primary care patient of Valerie Roys, DO. The CCM team was consulted for assistance with chronic disease management and care coordination needs.    Contacted patient today to follow up on medication management discussion from earlier this week.   Review of patient status, including review of consultants reports, relevant laboratory and other test results, and collaboration with appropriate care team members and the patient's provider was performed as part of comprehensive patient evaluation and provision of chronic care management services.    Goals Addressed            This Visit's Progress     Patient Stated   . "I'm worried about my BP" (pt-stated)       Current Barriers:  . Non Adherence to prescribed medication regimen- per PCP notes, patient was previously nonadherent to lisinopril 10 mg daily;  o ED visit 01/07/2019 where BP was 160/113; at subsequent PCP visit, was 165/93. Lisinopril 20 mg daily started . Patient reported to Eula Fried, LCSW on 01/20/2019 that she was having episodes of hypotension and she was very concerned about it. She had considered stopping the medication o Today, reports that with 2 days of lisinopril 10 mg, SBP was still in the 80-90s. She started taking lisinopril 5 mg yesterday, and plans to continue this until her appointment next week.  . Concurrent anxiety/depression likely also playing a role. Patient notes that she is regularly taking fluoxetine 80 mg (increased on 01/12/2019), trazodone 100 mg HS, lorazepam 0.5 mg once daily; irregularly taking hydroxyzine because "my allergies have been fine";  o Concurrent cyclobenzaprine, gabapentin for back pain, but patient notes she tries to  avoid these and hydroxyzine too regularly because they make her drowsy and dizzy o Notes that she only takes Advair and albuterol PRN SOB  Pharmacist Clinical Goal(s):  Marland Kitchen Over the next 30 days, patient will work with PCP and CCM team to address needs related to appropriate medication usage  Interventions: . Encouraged patient to continue taking lisinopril 5 mg daily, and to bring all BP readings to her upcoming appointment with PCP. She verbalized understanding.  . Reminded that hydroxyzine may help with anxiety, and that she can try taking this medication more regularly to help reduce her need to take lorazepam. She verbalized understanding.   Patient Self Care Activities:  . Self administers medications, but not exactly as prescribed  Please see past updates related to this goal by clicking on the "Past Updates" button in the selected goal          Plan:  - Patient has my contact information for any future medication-related questions or concerns.   Catie Darnelle Maffucci, PharmD Clinical Pharmacist [place] [phone]

## 2019-01-23 NOTE — Discharge Instructions (Addendum)
Increase water intake Go to Emergency Department if symptoms get worse

## 2019-01-26 LAB — URINE CULTURE: Culture: >=100000 — AB

## 2019-01-27 ENCOUNTER — Ambulatory Visit: Payer: Medicaid Other | Admitting: Family Medicine

## 2019-01-27 ENCOUNTER — Encounter: Payer: Self-pay | Admitting: Family Medicine

## 2019-01-27 ENCOUNTER — Other Ambulatory Visit: Payer: Self-pay

## 2019-01-27 VITALS — BP 131/85 | HR 78 | Temp 99.2°F | Wt 109.0 lb

## 2019-01-27 DIAGNOSIS — I10 Essential (primary) hypertension: Secondary | ICD-10-CM | POA: Diagnosis not present

## 2019-01-27 DIAGNOSIS — N3001 Acute cystitis with hematuria: Secondary | ICD-10-CM | POA: Diagnosis not present

## 2019-01-27 DIAGNOSIS — F419 Anxiety disorder, unspecified: Secondary | ICD-10-CM

## 2019-01-27 MED ORDER — NITROFURANTOIN MONOHYD MACRO 100 MG PO CAPS: 100.0000 mg | ORAL_CAPSULE | Freq: Two times a day (BID) | ORAL | 0 refills | Status: DC

## 2019-01-27 MED ORDER — LISINOPRIL 5 MG PO TABS: 5.0000 mg | ORAL_TABLET | Freq: Every day | ORAL | 1 refills | Status: DC

## 2019-01-27 NOTE — Assessment & Plan Note (Signed)
Over treated. Will decrease lisinopril to 5mg  daily. Call with any concerns. Continue to monitor.

## 2019-01-27 NOTE — Progress Notes (Signed)
BP 131/85   Pulse 78   Temp 99.2 F (37.3 C) (Oral)   Wt 109 lb (49.4 kg)   LMP 06/17/2011   SpO2 99%   BMI 18.71 kg/m    Subjective:    Patient ID: Jill Poole, female    DOB: 1961-04-10, 58 y.o.   MRN: [id number]  HPI: Jill Poole is a 58 y.o. female  Chief Complaint  Patient presents with  . Follow-up  . Depression  . Urinary Tract Infection    Went to UC. Difficulty talking antibiotic prescribed. Still weak. Can only keep down yogurt and pedilite   DEPRESSION Mood status: better Satisfied with current treatment?: yes Symptom severity: moderate  Duration of current treatment : 2-3 weeks Side effects: no Medication compliance: good compliance Psychotherapy/counseling: yes  Previous psychiatric medications: prozac Depressed mood: yes Anxious mood: yes Anhedonia: no Significant weight loss or gain: no Insomnia: no  Fatigue: no Feelings of worthlessness or guilt: no Impaired concentration/indecisiveness: no Suicidal ideations: no Hopelessness: no Crying spells: no Depression screen [facility] 2/9 01/27/2019 01/12/2019 12/23/2018 12/05/2018 06/06/2018  Decreased Interest 0 0 0 3 0  Down, Depressed, Hopeless 0 2 0 3 0  PHQ - 2 Score 0 2 0 6 0  Altered sleeping 1 0 0 3 2  Tired, decreased energy 3 0 0 3 0  Change in appetite 3 0 0 3 0  Feeling bad or failure about yourself  0 0 0 2 0  Trouble concentrating 1 0 0 3 0  Moving slowly or fidgety/restless 1 0 0 3 0  Suicidal thoughts 0 0 0 1 0  PHQ-9 Score 9 2 0 24 2  Difficult doing work/chores - Not difficult at all - - -   GAD 7 : Generalized Anxiety Score 01/27/2019 01/12/2019 12/05/2018 06/06/2018  Nervous, Anxious, on Edge 1 3 3 3   Control/stop worrying 0 3 3 0  Worry too much - different things 0 3 3 3   Trouble relaxing 0 3 3 0  Restless 0 3 3 0  Easily annoyed or irritable 1 3 3  0  Afraid - awful might happen 0 0 3 3  Total GAD 7 Score 2 18 21 9   Anxiety Difficulty - Somewhat difficult - -   HYPERTENSION- had  been taking her 20mg  of her lisinopril and BP was dropping very low and she was very dizzy. Decreased, and is now only taking 5mg  daily Hypertension status: controlled  Satisfied with current treatment? no Duration of hypertension: chronic BP monitoring frequency:  not checking BP medication side effects:  no Medication compliance: good compliance Previous BP meds: lisinopril Aspirin: no Recurrent headaches: no Visual changes: no Palpitations: no Dyspnea: no Chest pain: no Lower extremity edema: no Dizzy/lightheaded: yes  URINARY SYMPTOMS Duration: about a week- went to urgent care, feeling better, but throwing up on the keflex Dysuria: burning Urinary frequency: yes Urgency: yes Small volume voids: yes Symptom severity: moderate Urinary incontinence: no Foul odor: yes Hematuria: yes Abdominal pain: no Back pain: yes Suprapubic pain/pressure: yes Flank pain: yes Fever:  no Vomiting: yes Relief with cranberry juice: no Relief with pyridium: no Status: better Previous urinary tract infection: yes Recurrent urinary tract infection: no Treatments attempted: keflex   Relevant past medical, surgical, family and social history reviewed and updated as indicated. Interim medical history since our last visit reviewed. Allergies and medications reviewed and updated.  Review of Systems  Constitutional: Negative.   Respiratory: Negative.   Cardiovascular: Negative.   Gastrointestinal: Positive  for nausea and vomiting. Negative for abdominal distention, abdominal pain, anal bleeding, blood in stool, constipation, diarrhea and rectal pain.  Genitourinary: Positive for dysuria, flank pain, frequency and urgency. Negative for decreased urine volume, difficulty urinating, dyspareunia, enuresis, genital sores, hematuria, menstrual problem, pelvic pain, vaginal bleeding, vaginal discharge and vaginal pain.  Skin: Negative.   Psychiatric/Behavioral: Negative.     Per HPI unless  specifically indicated above     Objective:    BP 131/85   Pulse 78   Temp 99.2 F (37.3 C) (Oral)   Wt 109 lb (49.4 kg)   LMP 06/17/2011   SpO2 99%   BMI 18.71 kg/m   Wt Readings from Last 3 Encounters:  01/27/19 109 lb (49.4 kg)  01/23/19 109 lb (49.4 kg)  01/12/19 113 lb (51.3 kg)    Physical Exam Vitals signs and nursing note reviewed.  Constitutional:      General: She is not in acute distress.    Appearance: Normal appearance. She is not ill-appearing, toxic-appearing or diaphoretic.  HENT:     Head: Normocephalic and atraumatic.     Right Ear: External ear normal.     Left Ear: External ear normal.     Nose: Nose normal.     Mouth/Throat:     Mouth: Mucous membranes are moist.     Pharynx: Oropharynx is clear.  Eyes:     General: No scleral icterus.       Right eye: No discharge.        Left eye: No discharge.     Extraocular Movements: Extraocular movements intact.     Conjunctiva/sclera: Conjunctivae normal.     Pupils: Pupils are equal, round, and reactive to light.  Neck:     Musculoskeletal: Normal range of motion and neck supple.  Cardiovascular:     Rate and Rhythm: Normal rate and regular rhythm.     Pulses: Normal pulses.     Heart sounds: Normal heart sounds. No murmur. No friction rub. No gallop.   Pulmonary:     Effort: Pulmonary effort is normal. No respiratory distress.     Breath sounds: Normal breath sounds. No stridor. No wheezing, rhonchi or rales.  Chest:     Chest wall: No tenderness.  Abdominal:     General: Abdomen is flat. Bowel sounds are normal. There is no distension.     Palpations: Abdomen is soft. There is no mass.     Tenderness: There is no abdominal tenderness. There is no right CVA tenderness, left CVA tenderness, guarding or rebound.     Hernia: No hernia is present.  Musculoskeletal: Normal range of motion.  Skin:    General: Skin is warm and dry.     Capillary Refill: Capillary refill takes less than 2 seconds.      Coloration: Skin is not jaundiced or pale.     Findings: No bruising, erythema, lesion or rash.  Neurological:     General: No focal deficit present.     Mental Status: She is alert and oriented to person, place, and time. Mental status is at baseline.  Psychiatric:        Mood and Affect: Mood normal.        Behavior: Behavior normal.        Thought Content: Thought content normal.        Judgment: Judgment normal.     Results for orders placed or performed during the hospital encounter of 01/23/19  Urine Culture  Result Value  Ref Range   Specimen Description      URINE, RANDOM Performed at [facility], [address] [place], [place] [address]    Special Requests      NONE Performed at [facility], [address] [place], [place] [address]    Culture >=100,000 COLONIES/mL ESCHERICHIA COLI (A)    Report Status 01/26/2019 FINAL    Organism ID, Bacteria ESCHERICHIA COLI (A)       Susceptibility   Escherichia coli - MIC*    AMPICILLIN <=2 SENSITIVE Sensitive     CEFAZOLIN <=4 SENSITIVE Sensitive     CEFTRIAXONE <=1 SENSITIVE Sensitive     CIPROFLOXACIN <=0.25 SENSITIVE Sensitive     GENTAMICIN <=1 SENSITIVE Sensitive     IMIPENEM <=0.25 SENSITIVE Sensitive     NITROFURANTOIN <=16 SENSITIVE Sensitive     TRIMETH/SULFA <=20 SENSITIVE Sensitive     AMPICILLIN/SULBACTAM <=2 SENSITIVE Sensitive     PIP/TAZO <=4 SENSITIVE Sensitive     Extended ESBL NEGATIVE Sensitive     * >=100,000 COLONIES/mL ESCHERICHIA COLI  Urinalysis, Complete w Microscopic  Result Value Ref Range   Color, Urine YELLOW YELLOW   APPearance CLOUDY (A) CLEAR   Specific Gravity, Urine 1.020 1.005 - 1.030   pH 6.5 5.0 - 8.0   Glucose, UA NEGATIVE NEGATIVE mg/dL   Hgb urine dipstick MODERATE (A) NEGATIVE   Bilirubin Urine NEGATIVE NEGATIVE   Ketones, ur NEGATIVE NEGATIVE mg/dL   Protein, ur >300 (A) NEGATIVE mg/dL   Nitrite NEGATIVE NEGATIVE   Leukocytes,Ua SMALL (A)  NEGATIVE   Squamous Epithelial / LPF 11-20 0 - 5   WBC, UA >50 0 - 5 WBC/hpf   RBC / HPF 11-20 0 - 5 RBC/hpf   Bacteria, UA MANY (A) NONE SEEN      Assessment & Plan:   Problem List Items Addressed This Visit      Cardiovascular and Mediastinum   HTN (hypertension) - Primary    Over treated. Will decrease lisinopril to 5mg  daily. Call with any concerns. Continue to monitor.       Relevant Medications   lisinopril (ZESTRIL) 5 MG tablet   Other Relevant Orders   Basic metabolic panel     Other   Anxiety    Under good control on current regimen. Continue current regimen. Continue to monitor. Call with any concerns. Refills given of prozac. Follow up 1 month to see how she's doing, goal of decreasing lorazepam use.         Other Visit Diagnoses    Acute cystitis with hematuria       Bad reaction to keflex. Will change to nirtrofurantoin. Call with any concerns. Continue to monitor.    Relevant Orders   UA/M w/rflx Culture, Routine       Follow up plan: Return in about 4 weeks (around 02/24/2019) for follow up mood.

## 2019-01-27 NOTE — Assessment & Plan Note (Signed)
Under good control on current regimen. Continue current regimen. Continue to monitor. Call with any concerns. Refills given of prozac. Follow up 1 month to see how she's doing, goal of decreasing lorazepam use.

## 2019-01-28 ENCOUNTER — Telehealth: Payer: Self-pay | Admitting: Family Medicine

## 2019-01-28 ENCOUNTER — Observation Stay
Admission: EM | Admit: 2019-01-28 | Discharge: 2019-01-29 | Disposition: A | Discharge status: Home / Self Care | Payer: Medicaid Other | Attending: Internal Medicine | Admitting: Internal Medicine

## 2019-01-28 ENCOUNTER — Encounter: Payer: Self-pay | Admitting: Emergency Medicine

## 2019-01-28 ENCOUNTER — Emergency Department: Payer: Medicaid Other

## 2019-01-28 ENCOUNTER — Other Ambulatory Visit: Payer: Self-pay

## 2019-01-28 DIAGNOSIS — M4105 Infantile idiopathic scoliosis, thoracolumbar region: Secondary | ICD-10-CM | POA: Insufficient documentation

## 2019-01-28 DIAGNOSIS — F4310 Post-traumatic stress disorder, unspecified: Secondary | ICD-10-CM | POA: Diagnosis not present

## 2019-01-28 DIAGNOSIS — Z79899 Other long term (current) drug therapy: Secondary | ICD-10-CM | POA: Insufficient documentation

## 2019-01-28 DIAGNOSIS — I10 Essential (primary) hypertension: Secondary | ICD-10-CM | POA: Diagnosis not present

## 2019-01-28 DIAGNOSIS — Z1159 Encounter for screening for other viral diseases: Secondary | ICD-10-CM | POA: Insufficient documentation

## 2019-01-28 DIAGNOSIS — I700 Atherosclerosis of aorta: Secondary | ICD-10-CM | POA: Diagnosis not present

## 2019-01-28 DIAGNOSIS — K5900 Constipation, unspecified: Secondary | ICD-10-CM | POA: Diagnosis not present

## 2019-01-28 DIAGNOSIS — Z7982 Long term (current) use of aspirin: Secondary | ICD-10-CM | POA: Diagnosis not present

## 2019-01-28 DIAGNOSIS — F329 Major depressive disorder, single episode, unspecified: Secondary | ICD-10-CM | POA: Insufficient documentation

## 2019-01-28 DIAGNOSIS — E871 Hypo-osmolality and hyponatremia: Secondary | ICD-10-CM

## 2019-01-28 DIAGNOSIS — B9620 Unspecified Escherichia coli [E. coli] as the cause of diseases classified elsewhere: Secondary | ICD-10-CM | POA: Insufficient documentation

## 2019-01-28 DIAGNOSIS — J449 Chronic obstructive pulmonary disease, unspecified: Secondary | ICD-10-CM | POA: Diagnosis not present

## 2019-01-28 DIAGNOSIS — M5136 Other intervertebral disc degeneration, lumbar region: Secondary | ICD-10-CM | POA: Diagnosis not present

## 2019-01-28 DIAGNOSIS — R109 Unspecified abdominal pain: Secondary | ICD-10-CM | POA: Insufficient documentation

## 2019-01-28 DIAGNOSIS — Z7951 Long term (current) use of inhaled steroids: Secondary | ICD-10-CM | POA: Diagnosis not present

## 2019-01-28 DIAGNOSIS — Z791 Long term (current) use of non-steroidal anti-inflammatories (NSAID): Secondary | ICD-10-CM | POA: Diagnosis not present

## 2019-01-28 DIAGNOSIS — N179 Acute kidney failure, unspecified: Secondary | ICD-10-CM | POA: Diagnosis not present

## 2019-01-28 DIAGNOSIS — K859 Acute pancreatitis without necrosis or infection, unspecified: Secondary | ICD-10-CM | POA: Diagnosis not present

## 2019-01-28 DIAGNOSIS — Z03818 Encounter for observation for suspected exposure to other biological agents ruled out: Secondary | ICD-10-CM | POA: Diagnosis not present

## 2019-01-28 DIAGNOSIS — F17210 Nicotine dependence, cigarettes, uncomplicated: Secondary | ICD-10-CM | POA: Diagnosis not present

## 2019-01-28 DIAGNOSIS — R1084 Generalized abdominal pain: Secondary | ICD-10-CM | POA: Diagnosis not present

## 2019-01-28 DIAGNOSIS — K219 Gastro-esophageal reflux disease without esophagitis: Secondary | ICD-10-CM | POA: Diagnosis not present

## 2019-01-28 DIAGNOSIS — N390 Urinary tract infection, site not specified: Secondary | ICD-10-CM | POA: Diagnosis not present

## 2019-01-28 LAB — URINALYSIS, COMPLETE (UACMP) WITH MICROSCOPIC
Bacteria, UA: NONE SEEN
Bilirubin Urine: NEGATIVE
Glucose, UA: NEGATIVE mg/dL
Ketones, ur: NEGATIVE mg/dL
Nitrite: NEGATIVE
Protein, ur: 30 mg/dL — AB
Specific Gravity, Urine: 1.009 (ref 1.005–1.030)
pH: 6 (ref 5.0–8.0)

## 2019-01-28 LAB — COMPREHENSIVE METABOLIC PANEL
ALT: 29 U/L (ref 0–44)
AST: 40 U/L (ref 15–41)
Albumin: 3 g/dL — ABNORMAL LOW (ref 3.5–5.0)
Alkaline Phosphatase: 131 U/L — ABNORMAL HIGH (ref 38–126)
Anion gap: 14 (ref 5–15)
BUN: 58 mg/dL — ABNORMAL HIGH (ref 6–20)
CO2: 24 mmol/L (ref 22–32)
Calcium: 8.6 mg/dL — ABNORMAL LOW (ref 8.9–10.3)
Chloride: 86 mmol/L — ABNORMAL LOW (ref 98–111)
Creatinine, Ser: 1.95 mg/dL — ABNORMAL HIGH (ref 0.44–1.00)
GFR calc Af Amer: 33 mL/min — ABNORMAL LOW (ref >60)
GFR calc non Af Amer: 28 mL/min — ABNORMAL LOW (ref >60)
Glucose, Bld: 119 mg/dL — ABNORMAL HIGH (ref 70–99)
Potassium: 3.3 mmol/L — ABNORMAL LOW (ref 3.5–5.1)
Sodium: 124 mmol/L — ABNORMAL LOW (ref 135–145)
Total Bilirubin: 0.9 mg/dL (ref 0.3–1.2)
Total Protein: 7.1 g/dL (ref 6.5–8.1)

## 2019-01-28 LAB — BASIC METABOLIC PANEL
BUN/Creatinine Ratio: 24 — ABNORMAL HIGH (ref 9–23)
BUN: 60 mg/dL — ABNORMAL HIGH (ref 6–24)
CO2: 22 mmol/L (ref 20–29)
Calcium: 8.5 mg/dL — ABNORMAL LOW (ref 8.7–10.2)
Chloride: 80 mmol/L — ABNORMAL LOW (ref 96–106)
Creatinine, Ser: 2.53 mg/dL — ABNORMAL HIGH (ref 0.57–1.00)
GFR calc Af Amer: 24 mL/min/{1.73_m2} — ABNORMAL LOW (ref >59)
GFR calc non Af Amer: 21 mL/min/{1.73_m2} — ABNORMAL LOW (ref >59)
Glucose: 90 mg/dL (ref 65–99)
Potassium: 3.7 mmol/L (ref 3.5–5.2)
Sodium: 125 mmol/L — ABNORMAL LOW (ref 134–144)

## 2019-01-28 LAB — LIPASE, BLOOD: Lipase: 135 U/L — ABNORMAL HIGH (ref 11–51)

## 2019-01-28 LAB — CBC WITH DIFFERENTIAL/PLATELET
Abs Immature Granulocytes: 0.72 10*3/uL — ABNORMAL HIGH (ref 0.00–0.07)
Basophils Absolute: 0.1 10*3/uL (ref 0.0–0.1)
Basophils Relative: 0 %
Eosinophils Absolute: 0 10*3/uL (ref 0.0–0.5)
Eosinophils Relative: 0 %
HCT: 33.9 % — ABNORMAL LOW (ref 36.0–46.0)
Hemoglobin: 12.6 g/dL (ref 12.0–15.0)
Immature Granulocytes: 4 %
Lymphocytes Relative: 6 %
Lymphs Abs: 1.2 10*3/uL (ref 0.7–4.0)
MCH: 35.3 pg — ABNORMAL HIGH (ref 26.0–34.0)
MCHC: 37.2 g/dL — ABNORMAL HIGH (ref 30.0–36.0)
MCV: 95 fL (ref 80.0–100.0)
Monocytes Absolute: 1.1 10*3/uL — ABNORMAL HIGH (ref 0.1–1.0)
Monocytes Relative: 6 %
Neutro Abs: 15.9 10*3/uL — ABNORMAL HIGH (ref 1.7–7.7)
Neutrophils Relative %: 84 %
Platelets: 252 10*3/uL (ref 150–400)
RBC: 3.57 MIL/uL — ABNORMAL LOW (ref 3.87–5.11)
RDW: 13.4 % (ref 11.5–15.5)
WBC: 19 10*3/uL — ABNORMAL HIGH (ref 4.0–10.5)
nRBC: 0 % (ref 0.0–0.2)

## 2019-01-28 LAB — CBC
HCT: 34.1 % — ABNORMAL LOW (ref 36.0–46.0)
Hemoglobin: 12.7 g/dL (ref 12.0–15.0)
MCH: 35.7 pg — ABNORMAL HIGH (ref 26.0–34.0)
MCHC: 37.2 g/dL — ABNORMAL HIGH (ref 30.0–36.0)
MCV: 95.8 fL (ref 80.0–100.0)
Platelets: 244 10*3/uL (ref 150–400)
RBC: 3.56 MIL/uL — ABNORMAL LOW (ref 3.87–5.11)
RDW: 13.5 % (ref 11.5–15.5)
WBC: 20.1 10*3/uL — ABNORMAL HIGH (ref 4.0–10.5)
nRBC: 0 % (ref 0.0–0.2)

## 2019-01-28 LAB — SARS CORONAVIRUS 2 BY RT PCR (HOSPITAL ORDER, PERFORMED IN ~~LOC~~ HOSPITAL LAB): SARS Coronavirus 2: NEGATIVE

## 2019-01-28 MED ORDER — SODIUM CHLORIDE 0.9 % IV SOLN
INTRAVENOUS | Status: DC
  Administered 2019-01-28 – 2019-01-29 (×2): via INTRAVENOUS

## 2019-01-28 MED ORDER — HEPARIN SODIUM (PORCINE) 5000 UNIT/ML IJ SOLN
5000.0000 [IU] | Freq: Three times a day (TID) | INTRAMUSCULAR | Status: DC
  Administered 2019-01-28 – 2019-01-29 (×2): 5000 [IU] via SUBCUTANEOUS
  Filled 2019-01-28 (×2): qty 1

## 2019-01-28 MED ORDER — SODIUM CHLORIDE 0.9 % IV SOLN
1.0000 g | INTRAVENOUS | Status: DC
  Administered 2019-01-28: 1 g via INTRAVENOUS
  Filled 2019-01-28: qty 1
  Filled 2019-01-28: qty 10

## 2019-01-28 MED ORDER — MOMETASONE FURO-FORMOTEROL FUM 200-5 MCG/ACT IN AERO
2.0000 | INHALATION_SPRAY | Freq: Two times a day (BID) | RESPIRATORY_TRACT | Status: DC
  Administered 2019-01-28 – 2019-01-29 (×2): 2 via RESPIRATORY_TRACT
  Filled 2019-01-28 (×2): qty 8.8

## 2019-01-28 MED ORDER — ONDANSETRON HCL 4 MG/2ML IJ SOLN
4.0000 mg | Freq: Four times a day (QID) | INTRAMUSCULAR | Status: DC | PRN
  Administered 2019-01-28: 19:00:00 4 mg via INTRAVENOUS
  Filled 2019-01-28: qty 2

## 2019-01-28 MED ORDER — PROMETHAZINE HCL 25 MG/ML IJ SOLN
12.5000 mg | Freq: Four times a day (QID) | INTRAMUSCULAR | Status: DC | PRN
  Administered 2019-01-28: 15:00:00 12.5 mg via INTRAVENOUS
  Filled 2019-01-28: qty 1

## 2019-01-28 MED ORDER — FLUOXETINE HCL 20 MG PO CAPS
80.0000 mg | ORAL_CAPSULE | Freq: Every day | ORAL | Status: DC
  Administered 2019-01-29: 10:00:00 80 mg via ORAL
  Filled 2019-01-28 (×2): qty 4

## 2019-01-28 MED ORDER — DOCUSATE SODIUM 100 MG PO CAPS: 100.0000 mg | ORAL_CAPSULE | Freq: Two times a day (BID) | ORAL | Status: DC | PRN

## 2019-01-28 MED ORDER — FLUTICASONE PROPIONATE 50 MCG/ACT NA SUSP
1.0000 | Freq: Every day | NASAL | Status: DC | PRN
  Filled 2019-01-28: qty 16

## 2019-01-28 MED ORDER — MORPHINE SULFATE (PF) 2 MG/ML IV SOLN: 2.0000 mg | INTRAVENOUS | Status: DC | PRN

## 2019-01-28 MED ORDER — AMLODIPINE BESYLATE 5 MG PO TABS
5.0000 mg | ORAL_TABLET | Freq: Every day | ORAL | Status: DC
  Administered 2019-01-28 – 2019-01-29 (×2): 5 mg via ORAL
  Filled 2019-01-28 (×2): qty 1

## 2019-01-28 MED ORDER — LORAZEPAM 0.5 MG PO TABS: 0.5000 mg | ORAL_TABLET | Freq: Two times a day (BID) | ORAL | Status: DC | PRN

## 2019-01-28 MED ORDER — ASPIRIN EC 81 MG PO TBEC
81.0000 mg | DELAYED_RELEASE_TABLET | Freq: Every day | ORAL | Status: DC
  Administered 2019-01-28 – 2019-01-29 (×2): 81 mg via ORAL
  Filled 2019-01-28 (×2): qty 1

## 2019-01-28 MED ORDER — TRAZODONE HCL 50 MG PO TABS: 50.0000 mg | ORAL_TABLET | Freq: Every evening | ORAL | Status: DC | PRN

## 2019-01-28 MED ORDER — ACETAMINOPHEN 500 MG PO TABS: 500.0000 mg | ORAL_TABLET | Freq: Four times a day (QID) | ORAL | Status: DC | PRN

## 2019-01-28 MED ORDER — SODIUM CHLORIDE 0.9 % IV BOLUS
500.0000 mL | Freq: Once | INTRAVENOUS | Status: AC
  Administered 2019-01-28: 500 mL via INTRAVENOUS

## 2019-01-28 MED ORDER — SODIUM CHLORIDE 0.9% FLUSH
3.0000 mL | Freq: Once | INTRAVENOUS | Status: AC
  Administered 2019-01-28: 15:00:00 3 mL via INTRAVENOUS

## 2019-01-28 MED ORDER — PANTOPRAZOLE SODIUM 40 MG PO TBEC
40.0000 mg | DELAYED_RELEASE_TABLET | Freq: Every day | ORAL | Status: DC
  Administered 2019-01-28 – 2019-01-29 (×2): 40 mg via ORAL
  Filled 2019-01-28 (×2): qty 1

## 2019-01-28 MED ORDER — ALBUTEROL SULFATE (2.5 MG/3ML) 0.083% IN NEBU: 2.5000 mg | INHALATION_SOLUTION | RESPIRATORY_TRACT | Status: DC | PRN

## 2019-01-28 MED ORDER — POTASSIUM CHLORIDE CRYS ER 20 MEQ PO TBCR
20.0000 meq | EXTENDED_RELEASE_TABLET | Freq: Two times a day (BID) | ORAL | Status: DC
  Administered 2019-01-28 – 2019-01-29 (×2): 20 meq via ORAL
  Filled 2019-01-28 (×2): qty 1

## 2019-01-28 NOTE — Telephone Encounter (Signed)
Patient notified and verbalized understanding. 

## 2019-01-28 NOTE — ED Triage Notes (Signed)
pcp sent her here for abnormal labs.  She says she was unable to have bm for 4 weeks.  I found her in bathroom in lobby where she did have a bm.  She says she has not been urinating either.  She says she has been trying to drink fluids, but that she does vomit after drinking.

## 2019-01-28 NOTE — Telephone Encounter (Signed)
Please let her know that her blood work came back showing her to be in acute renal failure. She needs to go to the ER for evaluation and fluids. Thanks.

## 2019-01-28 NOTE — H&P (Addendum)
[place] [facility] [place] NAME: Jill Poole    MR#:  [id number]  DATE OF BIRTH:  1960-09-11  DATE OF ADMISSION:  01/28/2019  PRIMARY CARE PHYSICIAN: Valerie Roys, DO   REQUESTING/REFERRING PHYSICIAN: Quentin Cornwall  CHIEF COMPLAINT:   Chief Complaint  Patient presents with  . Abnormal Lab    HISTORY OF PRESENT ILLNESS: Jill Poole  is a 58 y.o. female with a known history of asthma, COPD, depression, posttraumatic stress disorder-was having abdominal pain and urinary symptoms for last few days.  She went to urgent care center-where they checked for UTI and gave her oral Keflex 4 days ago.  She took it for 3 days but continued to have significant vomiting and could not tolerate any oral diet so called them again yesterday they did the blood work and noted she has acute renal failure, advised to change to nitrofurantoin but she did not started taking it yet.  As her symptoms did not improve she decided to come to emergency room today. She is noted to be in acute renal failure and hyponatremia.  Her lipase is also elevated.  ER physician suggested to hospitalist service for further management. CT scan of abdomen was done which does not show any acute abnormality or obstruction.  Patient had one bowel movement while in ER.  She denies any diarrhea at home she denies any fever at home.  PAST MEDICAL HISTORY:   Past Medical History:  Diagnosis Date  . Asthma   . Back pain   . Concussion   . COPD (chronic obstructive pulmonary disease) ([place])   . DDD (degenerative disc disease), lumbar   . Depression   . Post traumatic stress disorder (PTSD)     PAST SURGICAL HISTORY:  Past Surgical History:  Procedure Laterality Date  . TIBIA FRACTURE SURGERY    . TUBAL LIGATION    . WRIST SURGERY      SOCIAL HISTORY:  Social History   Tobacco Use  . Smoking status: Current Every Day Smoker    Packs/day: 0.50    Types: Cigarettes  . Smokeless tobacco:  Never Used  Substance Use Topics  . Alcohol use: Yes    Alcohol/week: 6.0 standard drinks    Types: 6 Glasses of wine per week    Comment: once every two weeks    FAMILY HISTORY:  Family History  Problem Relation Age of Onset  . Arthritis Mother   . Hypertension Mother   . Hypertension Sister   . Diabetes Sister   . Cancer Sister   . Heart disease Brother   . Asthma Brother   . Other Son        Lipoma  . Cancer Maternal Grandmother        Stomach  . Breast cancer Maternal Aunt 50    DRUG ALLERGIES:  Allergies  Allergen Reactions  . Adenosine   . Keflex [Cephalexin] Nausea And Vomiting  . Tramadol Nausea And Vomiting  . Codeine Nausea And Vomiting and Other (See Comments)    Hot flashes, throwing up sick.  . Hydrocodone-Acetaminophen Nausea Only    REVIEW OF SYSTEMS:   CONSTITUTIONAL: No fever, have fatigue or weakness.  EYES: No blurred or double vision.  EARS, NOSE, AND THROAT: No tinnitus or ear pain.  RESPIRATORY: No cough, shortness of breath, wheezing or hemoptysis.  CARDIOVASCULAR: No chest pain, orthopnea, edema.  GASTROINTESTINAL: Have nausea, vomiting, no diarrhea, have abdominal pain.  GENITOURINARY: No dysuria, hematuria.  ENDOCRINE:  No polyuria, nocturia,  HEMATOLOGY: No anemia, easy bruising or bleeding SKIN: No rash or lesion. MUSCULOSKELETAL: No joint pain or arthritis.   NEUROLOGIC: No tingling, numbness, weakness.  PSYCHIATRY: No anxiety or depression.   MEDICATIONS AT HOME:  Prior to Admission medications   Medication Sig Start Date End Date Taking? Authorizing Provider  acetaminophen (TYLENOL) 500 MG tablet Take 500-1,000 mg by mouth every 6 (six) hours as needed for mild pain, moderate pain or fever.   Yes [provider]  albuterol (PROVENTIL HFA) 108 (90 Base) MCG/ACT inhaler INHALE 2 PUFFS BY MOUTH EVERY 6 HOURS AS NEEDED FOR WHEEZE OR SHORTNESS OF BREATH 10/17/18  Yes Johnson, Megan P, DO  aspirin 81 MG tablet Take 81 mg by  mouth daily.   Yes [provider]  cyclobenzaprine (FLEXERIL) 10 MG tablet Take 1 tablet (10 mg total) by mouth at bedtime. Patient taking differently: Take 10 mg by mouth at bedtime as needed for muscle spasms.  10/17/18  Yes Johnson, Megan P, DO  FLUoxetine (PROZAC) 40 MG capsule Take 2 capsules (80 mg total) by mouth daily. 01/12/19  Yes Johnson, Megan P, DO  fluticasone (FLONASE) 50 MCG/ACT nasal spray Place 1 spray into both nostrils daily as needed for allergies.    Yes [provider]  Fluticasone-Salmeterol (ADVAIR) 250-50 MCG/DOSE AEPB Inhale 1 puff into the lungs 2 (two) times daily. 10/17/18  Yes Johnson, Megan P, DO  gabapentin (NEURONTIN) 300 MG capsule Take 1 capsule (300 mg total) by mouth 3 (three) times daily for 30 days. Patient taking differently: Take 300 mg by mouth 3 (three) times daily as needed (pain).  10/17/18 01/28/19 Yes Johnson, Megan P, DO  hydrOXYzine (ATARAX/VISTARIL) 25 MG tablet Take 1-2 tablets (25-50 mg total) by mouth every 8 (eight) hours as needed. Patient taking differently: Take 25-50 mg by mouth every 8 (eight) hours as needed for itching.  10/17/18  Yes Johnson, Megan P, DO  lisinopril (ZESTRIL) 5 MG tablet Take 1 tablet (5 mg total) by mouth daily. 01/27/19  Yes Johnson, Megan P, DO  LORazepam (ATIVAN) 0.5 MG tablet Take 1 tablet (0.5 mg total) by mouth 2 (two) times daily as needed for anxiety. 01/12/19  Yes Johnson, Megan P, DO  naproxen (NAPROSYN) 500 MG tablet Take 1 tablet (500 mg total) by mouth 2 (two) times daily with a meal. Patient taking differently: Take 500 mg by mouth 2 (two) times daily as needed for mild pain or moderate pain.  10/17/18  Yes Johnson, Megan P, DO  omeprazole (PRILOSEC) 40 MG capsule Take 1 capsule (40 mg total) by mouth daily. Patient taking differently: Take 40 mg by mouth daily as needed (GERD symptoms).  11/05/18  Yes Johnson, Megan P, DO  ondansetron (ZOFRAN ODT) 8 MG disintegrating tablet Take 1 tablet (8 mg  total) by mouth every 8 (eight) hours as needed. 01/23/19  Yes Norval Gable, MD  traZODone (DESYREL) 100 MG tablet TAKE 1 TABLET(100 MG) BY MOUTH AT BEDTIME Patient taking differently: Take 50 mg by mouth at bedtime as needed for sleep.  10/17/18  Yes Johnson, Megan P, DO  nitrofurantoin, macrocrystal-monohydrate, (MACROBID) 100 MG capsule Take 1 capsule (100 mg total) by mouth 2 (two) times daily. 01/27/19   Johnson, Megan P, DO      PHYSICAL EXAMINATION:   VITAL SIGNS: Blood pressure (!) 163/104, pulse 66, temperature 98.7 F (37.1 C), resp. rate 16, height 5\' 4"  (1.626 m), weight 49.4 kg, last menstrual period 06/17/2011, SpO2 99 %.  GENERAL:  58 y.o.-year-old patient lying in the bed with no acute distress.  EYES: Pupils equal, round, reactive to light and accommodation. No scleral icterus. Extraocular muscles intact.  HEENT: Head atraumatic, normocephalic. Oropharynx and nasopharynx clear.  NECK:  Supple, no jugular venous distention. No thyroid enlargement, no tenderness.  LUNGS: Normal breath sounds bilaterally, no wheezing, rales,rhonchi or crepitation. No use of accessory muscles of respiration.  CARDIOVASCULAR: S1, S2 normal. No murmurs, rubs, or gallops.  ABDOMEN: Soft, tender, nondistended. Bowel sounds present. No organomegaly or mass.  EXTREMITIES: No pedal edema, cyanosis, or clubbing.  NEUROLOGIC: Cranial nerves II through XII are intact. Muscle strength 5/5 in all extremities. Sensation intact. Gait not checked.  PSYCHIATRIC: The patient is alert and oriented x 3.  SKIN: No obvious rash, lesion, or ulcer.   LABORATORY PANEL:   CBC Recent Labs  Lab 01/28/19 1319  WBC 19.0*  20.1*  HGB 12.6  12.7  HCT 33.9*  34.1*  PLT 252  244  MCV 95.0  95.8  MCH 35.3*  35.7*  MCHC 37.2*  37.2*  RDW 13.4  13.5  LYMPHSABS 1.2  MONOABS 1.1*  EOSABS 0.0  BASOSABS 0.1    ------------------------------------------------------------------------------------------------------------------  Chemistries  Recent Labs  Lab 01/27/19 1050 01/28/19 1319  NA 125* 124*  K 3.7 3.3*  CL 80* 86*  CO2 22 24  GLUCOSE 90 119*  BUN 60* 58*  CREATININE 2.53* 1.95*  CALCIUM 8.5* 8.6*  AST  --  40  ALT  --  29  ALKPHOS  --  131*  BILITOT  --  0.9   ------------------------------------------------------------------------------------------------------------------ estimated creatinine clearance is 25.1 mL/min (A) (by C-G formula based on SCr of 1.95 mg/dL (H)). ------------------------------------------------------------------------------------------------------------------ No results for input(s): TSH, T4TOTAL, T3FREE, THYROIDAB in the last 72 hours.  Invalid input(s): FREET3   Coagulation profile No results for input(s): INR, PROTIME in the last 168 hours. ------------------------------------------------------------------------------------------------------------------- No results for input(s): DDIMER in the last 72 hours. -------------------------------------------------------------------------------------------------------------------  Cardiac Enzymes No results for input(s): CKMB, TROPONINI, MYOGLOBIN in the last 168 hours.  Invalid input(s): CK ------------------------------------------------------------------------------------------------------------------ Invalid input(s): POCBNP  ---------------------------------------------------------------------------------------------------------------  Urinalysis    Component Value Date/Time   COLORURINE YELLOW 01/23/2019 1758   APPEARANCEUR Clear 01/27/2019 1106   LABSPEC 1.020 01/23/2019 1758   PHURINE 6.5 01/23/2019 1758   GLUCOSEU Negative 01/27/2019 1106   HGBUR MODERATE (A) 01/23/2019 1758   BILIRUBINUR Negative 01/27/2019 1106   [place] 01/23/2019 1758   PROTEINUR 1+ (A) 01/27/2019 1106    PROTEINUR >300 (A) 01/23/2019 1758   UROBILINOGEN 0.2 11/29/2011 1028   NITRITE Negative 01/27/2019 1106   NITRITE NEGATIVE 01/23/2019 1758   LEUKOCYTESUR 3+ (A) 01/27/2019 1106   LEUKOCYTESUR SMALL (A) 01/23/2019 1758     RADIOLOGY: Ct Abdomen Pelvis Wo Contrast  Result Date: 01/28/2019 CLINICAL DATA:  Back pain and abdominal pain x4 days. Constipation x1 month. EXAM: CT ABDOMEN AND PELVIS WITHOUT CONTRAST TECHNIQUE: Multidetector CT imaging of the abdomen and pelvis was performed following the standard protocol without IV contrast. COMPARISON:  None. FINDINGS: Lower chest: No acute abnormality. Hepatobiliary: No focal liver abnormality is seen. No gallstones, gallbladder wall thickening, or biliary dilatation. Pancreas: Unremarkable. No pancreatic ductal dilatation or surrounding inflammatory changes. Spleen: Normal in size without focal abnormality. Adrenals/Urinary Tract: Adrenal glands are unremarkable. Kidneys are normal, without renal calculi, focal lesion, or hydronephrosis. Bladder is unremarkable. Stomach/Bowel: Stomach is within normal limits. Appendix appears normal. No evidence of bowel wall thickening, distention, or inflammatory changes. Vascular/Lymphatic: Aortic  atherosclerosis. No enlarged abdominal or pelvic lymph nodes. Reproductive: Uterus and bilateral adnexa are unremarkable. Other: No abdominal wall hernia or abnormality. No abdominopelvic ascites. Musculoskeletal: There are degenerative changes throughout the lumbar spine, greatest at the L2-L3 and L5-S1 levels. There is no displaced fracture. A moderate levoscoliosis is noted of the lumbar spine, centered at the L2-L3 level. The patient is status post bilateral pubic rami fractures, both of which have healed. IMPRESSION: 1. No acute intra-abdominal abnormality detected. No hydronephrosis. No radiopaque obstructing kidney stones. 2.  Aortic Atherosclerosis (ICD10-I70.0). Electronically Signed   By: Constance Holster M.D.    On: 01/28/2019 15:02    EKG: Orders placed or performed during the hospital encounter of 01/28/19  . ED EKG  . ED EKG    IMPRESSION AND PLAN:  *Acute renal failure Due to dehydration as not able to eat for last few days. IV fluid and avoid nephrotoxic medications.  *Acute pancreatitis Lipase is elevated.  CT abdomen does not show any signs of biliary pathology.  Patient has significant nausea and abdominal pain. Will manage symptomatically with antinausea medications and IV fluid.  *Hyponatremia Due to decreased oral intake and persistent vomiting Continue normal saline IV and monitor tomorrow.  *UTI Patient was diagnosed with UTI 3 days ago in urgent care center and received 3 days of oral Keflex which she was not tolerating very well due to nausea. I will give IV Rocephin for now and will also send blood and urine culture in the hospital.  We have a culture result available from 4 days ago from outpatient sample.  *Hypertension As patient would be n.p.o. and nauseated I would like to just give a small dose of amlodipine and continue to monitor. Hold lisinopril due to nephrotoxicity.  *Hypokalemia Give oral replacement.  *Active smoking Counseled to quit smoking for 4 minutes and offered nicotine patch.  All the records are reviewed and case discussed with ED provider. Management plans discussed with the patient, family and they are in agreement.  CODE STATUS: Limited code-she would like to have CPR and defibrillator with medication use but does not want to be intubated or put on the ventilatory support. Code Status History    Date Active Date Inactive Code Status Order ID Comments User Context   09/24/2013 2117 09/25/2013 1804 Full Code [id number]  Nat Christen, MD ED       TOTAL TIME TAKING CARE OF THIS PATIENT: 50 minutes.    Vaughan Basta M.D on 01/28/2019   Between 7am to 6pm - Pager - [phone]  After 6pm go to [email or web address] - password [email or web address]  [facility]  Office  [phone]  CC: Primary care physician; Valerie Roys, DO   Note: This dictation was prepared with Dragon dictation along with smaller phrase technology. Any transcriptional errors that result from this process are unintentional.

## 2019-01-28 NOTE — Progress Notes (Signed)
Family Meeting Note  Advance Directive:no  Today a meeting took place with the Patient.  The following clinical team members were present during this meeting:MD  The following were discussed:Patient's diagnosis: UTI, acute pancreatitis, acute renal failure, hyponatremia, hypertension, Patient's progosis: Unable to determine and Goals for treatment: Continue present management  Patient told me her brothers and sisters and most of the family members live in [place].  She has a good friend named Butch Penny over here.  She would like Butch Penny to be her healthcare power of attorney in any adverse event.  I encouraged her to speak to Butch Penny and all her family members regarding this decision to make it clear. she would like to have CPR and defibrillator with medication use but does not want to be intubated or put on the ventilatory support.  Additional follow-up to be provided: PMD.  Time spent during discussion:20 minutes  Jill Basta, MD

## 2019-01-28 NOTE — ED Notes (Signed)
ED TO INPATIENT HANDOFF REPORT  ED Nurse Name and Phone #: Vicente Males [place] Name/Age/Gender Jill Poole 58 y.o. female Room/Bed: ED26A/ED26A  Code Status   Code Status: Prior  Home/SNF/Other Home Patient oriented to: self, place, time and situation Is this baseline? Yes   Triage Complete: Triage complete  Chief Complaint abnormal labs  Triage Note pcp sent her here for abnormal labs.  She says she was unable to have bm for 4 weeks.  I found her in bathroom in lobby where she did have a bm.  She says she has not been urinating either.  She says she has been trying to drink fluids, but that she does vomit after drinking.     Allergies Allergies  Allergen Reactions  . Adenosine   . Keflex [Cephalexin] Nausea And Vomiting  . Tramadol Nausea And Vomiting  . Codeine Nausea And Vomiting and Other (See Comments)    Hot flashes, throwing up sick.  . Hydrocodone-Acetaminophen Nausea Only    Level of Care/Admitting Diagnosis ED Disposition    ED Disposition Condition Comment   Admit  The patient appears reasonably stabilized for admission considering the current resources, flow, and capabilities available in the ED at this time, and I doubt any other [facility] requiring further screening and/or treatment in the ED prior to admission is  present.       B Medical/Surgery History Past Medical History:  Diagnosis Date  . Asthma   . Back pain   . Concussion   . COPD (chronic obstructive pulmonary disease) ([place])   . DDD (degenerative disc disease), lumbar   . Depression   . Post traumatic stress disorder (PTSD)    Past Surgical History:  Procedure Laterality Date  . TIBIA FRACTURE SURGERY    . TUBAL LIGATION    . WRIST SURGERY       A IV Location/Drains/Wounds Patient Lines/Drains/Airways Status   Active Line/Drains/Airways    Name:   Placement date:   Placement time:   Site:   Days:   Peripheral IV 01/28/19 Left Forearm   01/28/19    1329    Forearm   less than 1           Intake/Output Last 24 hours No intake or output data in the 24 hours ending 01/28/19 1434  Labs/Imaging Results for orders placed or performed during the hospital encounter of 01/28/19 (from the past 48 hour(s))  Lipase, blood     Status: Abnormal   Collection Time: 01/28/19  1:19 PM  Result Value Ref Range   Lipase 135 (H) 11 - 51 U/L    Comment: Performed at [facility], [address] [place], [place] [address]  Comprehensive metabolic panel     Status: Abnormal   Collection Time: 01/28/19  1:19 PM  Result Value Ref Range   Sodium 124 (L) 135 - 145 mmol/L   Potassium 3.3 (L) 3.5 - 5.1 mmol/L   Chloride 86 (L) 98 - 111 mmol/L   CO2 24 22 - 32 mmol/L   Glucose, Bld 119 (H) 70 - 99 mg/dL   BUN 58 (H) 6 - 20 mg/dL   Creatinine, Ser 1.95 (H) 0.44 - 1.00 mg/dL   Calcium 8.6 (L) 8.9 - 10.3 mg/dL   Total Protein 7.1 6.5 - 8.1 g/dL   Albumin 3.0 (L) 3.5 - 5.0 g/dL   AST 40 15 - 41 U/L   ALT 29 0 - 44 U/L   Alkaline Phosphatase 131 (H) 38 -  126 U/L   Total Bilirubin 0.9 0.3 - 1.2 mg/dL   GFR calc non Af Amer 28 (L) >60 mL/min   GFR calc Af Amer 33 (L) >60 mL/min   Anion gap 14 5 - 15    Comment: Performed at [facility], [address] [place], [place] [address]  CBC     Status: Abnormal   Collection Time: 01/28/19  1:19 PM  Result Value Ref Range   WBC 20.1 (H) 4.0 - 10.5 K/uL   RBC 3.56 (L) 3.87 - 5.11 MIL/uL   Hemoglobin 12.7 12.0 - 15.0 g/dL   HCT 34.1 (L) 36.0 - 46.0 %   MCV 95.8 80.0 - 100.0 fL   MCH 35.7 (H) 26.0 - 34.0 pg   MCHC 37.2 (H) 30.0 - 36.0 g/dL   RDW 13.5 11.5 - 15.5 %   Platelets 244 150 - 400 K/uL   nRBC 0.0 0.0 - 0.2 %    Comment: Performed at [facility], [address], [place], [place] [address]   No results found.  Pending Labs [employer] (From admission, onward)    Start     Ordered   01/28/19 1404  SARS Coronavirus 2 (CEPHEID - Performed in [place] hospital lab), Hosp Order  (Asymptomatic  Patients Labs)  Once,   STAT    Question:  Rule Out  Answer:  Yes   01/28/19 1403   01/28/19 1309  Urinalysis, Complete w Microscopic  ONCE - STAT,   STAT     01/28/19 1309          Vitals/Pain Today's Vitals   01/28/19 1307 01/28/19 1308 01/28/19 1354  BP: (!) 144/81    Pulse: 81    Resp: 18    Temp: 98.7 F (37.1 C)    SpO2: 98%    Weight:  49.4 kg   Height:  5\' 4"  (1.626 m)   PainSc: 10-Worst pain ever  7     Isolation Precautions No active isolations  Medications Medications  sodium chloride flush (NS) 0.9 % injection 3 mL (has no administration in time range)  sodium chloride 0.9 % bolus 500 mL (has no administration in time range)  promethazine (PHENERGAN) injection 12.5 mg (has no administration in time range)    Mobility walks Low fall risk   Focused Assessments .   R Recommendations: See Admitting Provider Note  Report given to:   Additional Notes: .

## 2019-01-28 NOTE — ED Notes (Signed)
Patient transported to CT 

## 2019-01-28 NOTE — ED Notes (Signed)
Patient reports back and abdominal pain x 4 days with vomiting as well as headache.  Patient reports constipation x 1 month.

## 2019-01-28 NOTE — ED Provider Notes (Signed)
[facility] Emergency Department Provider Note    First MD Initiated Contact with Patient 01/28/19 1353     (approximate)  I have reviewed the triage vital signs and the nursing notes.   HISTORY  Chief Complaint Abnormal Lab    HPI Jill Poole is a 58 y.o. female presents the ER for evaluation of abnormal blood work.  Patient has been seen by her PCP as well as urgent care the past several days for diffuse abdominal pain as well as decreased bowel movements and nausea.  States she had trouble keeping any food or fluids down.  Not having any diarrhea.  Was put on Keflex for UTI.  Denies any dysuria.  Denies any back pain at this time.  Has never had symptoms like this before.    Past Medical History:  Diagnosis Date  . Asthma   . Back pain   . Concussion   . COPD (chronic obstructive pulmonary disease) ([place])   . DDD (degenerative disc disease), lumbar   . Depression   . Post traumatic stress disorder (PTSD)    Family History  Problem Relation Age of Onset  . Arthritis Mother   . Hypertension Mother   . Hypertension Sister   . Diabetes Sister   . Cancer Sister   . Heart disease Brother   . Asthma Brother   . Other Son        Lipoma  . Cancer Maternal Grandmother        Stomach  . Breast cancer Maternal Aunt 23   Past Surgical History:  Procedure Laterality Date  . TIBIA FRACTURE SURGERY    . TUBAL LIGATION    . WRIST SURGERY     Patient Active Problem List   Diagnosis Date Noted  . Moderate episode of recurrent major depressive disorder ([place]) 12/23/2018  . HTN (hypertension) 04/01/2018  . GERD (gastroesophageal reflux disease) 02/25/2017  . Rosacea 09/14/2016  . Insomnia 09/14/2016  . 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 DDD (degenerative disc disease), lumbar   . COPD (chronic obstructive pulmonary disease) ([place])   . Anxiety 02/29/2016  . Infantile idiopathic scoliosis of thoracolumbar region 02/29/2016  . Carpal tunnel syndrome on left  09/15/2014  . Smoking 09/15/2014  . Left leg weakness 03/18/2014  . Lumbar radiculopathy 03/18/2014  . Nicotine dependence 12/02/2013  . Sciatica 12/02/2013      Prior to Admission medications   Medication Sig Start Date End Date Taking? Authorizing Provider  acetaminophen (TYLENOL) 500 MG tablet Take 500-1,000 mg by mouth every 6 (six) hours as needed for mild pain, moderate pain or fever.   Yes [provider]  albuterol (PROVENTIL HFA) 108 (90 Base) MCG/ACT inhaler INHALE 2 PUFFS BY MOUTH EVERY 6 HOURS AS NEEDED FOR WHEEZE OR SHORTNESS OF BREATH 10/17/18  Yes Johnson, Megan P, DO  aspirin 81 MG tablet Take 81 mg by mouth daily.   Yes [provider]  cyclobenzaprine (FLEXERIL) 10 MG tablet Take 1 tablet (10 mg total) by mouth at bedtime. Patient taking differently: Take 10 mg by mouth at bedtime as needed for muscle spasms.  10/17/18  Yes Johnson, Megan P, DO  FLUoxetine (PROZAC) 40 MG capsule Take 2 capsules (80 mg total) by mouth daily. 01/12/19  Yes Johnson, Megan P, DO  fluticasone (FLONASE) 50 MCG/ACT nasal spray Place 1 spray into both nostrils daily as needed for allergies.    Yes [provider]  Fluticasone-Salmeterol (ADVAIR) 250-50 MCG/DOSE AEPB Inhale 1 puff into  the lungs 2 (two) times daily. 10/17/18  Yes Johnson, Megan P, DO  gabapentin (NEURONTIN) 300 MG capsule Take 1 capsule (300 mg total) by mouth 3 (three) times daily for 30 days. Patient taking differently: Take 300 mg by mouth 3 (three) times daily as needed (pain).  10/17/18 01/28/19 Yes Johnson, Megan P, DO  hydrOXYzine (ATARAX/VISTARIL) 25 MG tablet Take 1-2 tablets (25-50 mg total) by mouth every 8 (eight) hours as needed. Patient taking differently: Take 25-50 mg by mouth every 8 (eight) hours as needed for itching.  10/17/18  Yes Johnson, Megan P, DO  lisinopril (ZESTRIL) 5 MG tablet Take 1 tablet (5 mg total) by mouth daily. 01/27/19  Yes Johnson, Megan P, DO  LORazepam (ATIVAN) 0.5 MG  tablet Take 1 tablet (0.5 mg total) by mouth 2 (two) times daily as needed for anxiety. 01/12/19  Yes Johnson, Megan P, DO  naproxen (NAPROSYN) 500 MG tablet Take 1 tablet (500 mg total) by mouth 2 (two) times daily with a meal. Patient taking differently: Take 500 mg by mouth 2 (two) times daily as needed for mild pain or moderate pain.  10/17/18  Yes Johnson, Megan P, DO  omeprazole (PRILOSEC) 40 MG capsule Take 1 capsule (40 mg total) by mouth daily. Patient taking differently: Take 40 mg by mouth daily as needed (GERD symptoms).  11/05/18  Yes Johnson, Megan P, DO  ondansetron (ZOFRAN ODT) 8 MG disintegrating tablet Take 1 tablet (8 mg total) by mouth every 8 (eight) hours as needed. 01/23/19  Yes Norval Gable, MD  traZODone (DESYREL) 100 MG tablet TAKE 1 TABLET(100 MG) BY MOUTH AT BEDTIME Patient taking differently: Take 50 mg by mouth at bedtime as needed for sleep.  10/17/18  Yes Johnson, Megan P, DO  nitrofurantoin, macrocrystal-monohydrate, (MACROBID) 100 MG capsule Take 1 capsule (100 mg total) by mouth 2 (two) times daily. 01/27/19   Park Liter P, DO    Allergies Adenosine; Keflex [cephalexin]; Tramadol; Codeine; and Hydrocodone-acetaminophen    Social History Social History   Tobacco Use  . Smoking status: Current Every Day Smoker    Packs/day: 0.50    Types: Cigarettes  . Smokeless tobacco: Never Used  Substance Use Topics  . Alcohol use: Yes    Alcohol/week: 6.0 standard drinks    Types: 6 Glasses of wine per week    Comment: once every two weeks  . Drug use: No    Review of Systems Patient denies headaches, rhinorrhea, blurry vision, numbness, shortness of breath, chest pain, edema, cough, abdominal pain, nausea, vomiting, diarrhea, dysuria, fevers, rashes or hallucinations unless otherwise stated above in HPI. ____________________________________________   PHYSICAL EXAM:  VITAL SIGNS: Vitals:   01/28/19 1307 01/28/19 1400  BP: (!) 144/81 (!) 161/88  Pulse: 81  68  Resp: 18 12  Temp: 98.7 F (37.1 C)   SpO2: 98% 100%    Constitutional: Alert and oriented. Frail and fatigued appearing Eyes: Conjunctivae are normal.  Head: Atraumatic. Nose: No congestion/rhinnorhea. Mouth/Throat: Mucous membranes are dry   Neck: No stridor. Painless ROM.  Cardiovascular: Normal rate, regular rhythm. Grossly normal heart sounds.  Good peripheral circulation. Respiratory: Normal respiratory effort.  No retractions. Lungs CTAB. Gastrointestinal: Soft and nontender. No distention. No abdominal bruits. No CVA tenderness. Genitourinary: deferred Musculoskeletal: No lower extremity tenderness nor edema.  No joint effusions. Neurologic:  Normal speech and language. No gross focal neurologic deficits are appreciated. No facial droop Skin:  Skin is warm, dry and intact. No rash noted. Psychiatric: Mood  and affect are normal. Speech and behavior are normal.  ____________________________________________   LABS (all labs ordered are listed, but only abnormal results are displayed)  Results for orders placed or performed during the hospital encounter of 01/28/19 (from the past 24 hour(s))  Lipase, blood     Status: Abnormal   Collection Time: 01/28/19  1:19 PM  Result Value Ref Range   Lipase 135 (H) 11 - 51 U/L  Comprehensive metabolic panel     Status: Abnormal   Collection Time: 01/28/19  1:19 PM  Result Value Ref Range   Sodium 124 (L) 135 - 145 mmol/L   Potassium 3.3 (L) 3.5 - 5.1 mmol/L   Chloride 86 (L) 98 - 111 mmol/L   CO2 24 22 - 32 mmol/L   Glucose, Bld 119 (H) 70 - 99 mg/dL   BUN 58 (H) 6 - 20 mg/dL   Creatinine, Ser 1.95 (H) 0.44 - 1.00 mg/dL   Calcium 8.6 (L) 8.9 - 10.3 mg/dL   Total Protein 7.1 6.5 - 8.1 g/dL   Albumin 3.0 (L) 3.5 - 5.0 g/dL   AST 40 15 - 41 U/L   ALT 29 0 - 44 U/L   Alkaline Phosphatase 131 (H) 38 - 126 U/L   Total Bilirubin 0.9 0.3 - 1.2 mg/dL   GFR calc non Af Amer 28 (L) >60 mL/min   GFR calc Af Amer 33 (L) >60 mL/min    Anion gap 14 5 - 15  CBC     Status: Abnormal   Collection Time: 01/28/19  1:19 PM  Result Value Ref Range   WBC 20.1 (H) 4.0 - 10.5 K/uL   RBC 3.56 (L) 3.87 - 5.11 MIL/uL   Hemoglobin 12.7 12.0 - 15.0 g/dL   HCT 34.1 (L) 36.0 - 46.0 %   MCV 95.8 80.0 - 100.0 fL   MCH 35.7 (H) 26.0 - 34.0 pg   MCHC 37.2 (H) 30.0 - 36.0 g/dL   RDW 13.5 11.5 - 15.5 %   Platelets 244 150 - 400 K/uL   nRBC 0.0 0.0 - 0.2 %   ____________________________________________  EKG My review and personal interpretation at Time: 13:10   Indication: abd pain  Rate: 85  Rhythm: sinus Axis: normal Other: normal intervals, no stemi ____________________________________________  RADIOLOGY  I personally reviewed all radiographic images ordered to evaluate for the above acute complaints and reviewed radiology reports and findings.  These findings were personally discussed with the patient.  Please see medical record for radiology report.  ____________________________________________   PROCEDURES  Procedure(s) performed:  .Critical Care Performed by: Merlyn Lot, MD Authorized by: Merlyn Lot, MD   Critical care provider statement:    Critical care time (minutes):  30   Critical care time was exclusive of:  Separately billable procedures and treating other patients   Critical care was necessary to treat or prevent imminent or life-threatening deterioration of the following conditions:  Metabolic crisis   Critical care was time spent personally by me on the following activities:  Development of treatment plan with patient or surrogate, discussions with consultants, evaluation of patient's response to treatment, examination of patient, obtaining history from patient or surrogate, ordering and performing treatments and interventions, ordering and review of laboratory studies, ordering and review of radiographic studies, pulse oximetry, re-evaluation of patient's condition and review of old charts       Critical Care performed: yes ____________________________________________   INITIAL IMPRESSION / [place] / ED COURSE  Pertinent labs &  imaging results that were available during my care of the patient were reviewed by me and considered in my medical decision making (see chart for details).   DDX: Enteritis, SBO, colitis, diverticulitis, appendicitis, [place], stone, mass, pancreatitis, medication effect  Jill Poole is a 58 y.o. who presents to the ED with symptoms as described above.  Patient with evidence of acute hyponatremia as well as AKI.  Lipase also mildly elevated with leukocytosis.  Will order CT imaging as well as IV pain medication IV antiemetics and IV fluids.  Patient will require admission to the hospital.     The patient was evaluated in Emergency Department today for the symptoms described in the history of present illness. He/she was evaluated in the context of the global COVID-19 pandemic, which necessitated consideration that the patient might be at risk for infection with the SARS-CoV-2 virus that causes COVID-19. Institutional protocols and algorithms that pertain to the evaluation of patients at risk for COVID-19 are in a state of rapid change based on information released by regulatory bodies including the CDC and federal and state organizations. These policies and algorithms were followed during the patient's care in the ED.  As part of my medical decision making, I reviewed the following data within the [facility] notes reviewed and incorporated, Labs reviewed, notes from prior ED visits and [place] Controlled Substance Database   ____________________________________________   FINAL CLINICAL IMPRESSION(S) / ED DIAGNOSES  Final diagnoses:  Acute hyponatremia  AKI (acute kidney injury) ([place])  Abdominal pain, unspecified abdominal location      NEW MEDICATIONS STARTED DURING THIS VISIT:  New Prescriptions   No medications on  file     Note:  This document was prepared using Dragon voice recognition software and may include unintentional dictation errors.    Merlyn Lot, MD 01/28/19 [phone]

## 2019-01-29 DIAGNOSIS — N390 Urinary tract infection, site not specified: Secondary | ICD-10-CM | POA: Diagnosis not present

## 2019-01-29 DIAGNOSIS — R112 Nausea with vomiting, unspecified: Secondary | ICD-10-CM | POA: Diagnosis not present

## 2019-01-29 DIAGNOSIS — N179 Acute kidney failure, unspecified: Secondary | ICD-10-CM | POA: Diagnosis not present

## 2019-01-29 DIAGNOSIS — E871 Hypo-osmolality and hyponatremia: Secondary | ICD-10-CM | POA: Diagnosis not present

## 2019-01-29 LAB — URINE CULTURE: Culture: NO GROWTH

## 2019-01-29 LAB — CBC
HCT: 27.4 % — ABNORMAL LOW (ref 36.0–46.0)
Hemoglobin: 10.1 g/dL — ABNORMAL LOW (ref 12.0–15.0)
MCH: 35.6 pg — ABNORMAL HIGH (ref 26.0–34.0)
MCHC: 36.9 g/dL — ABNORMAL HIGH (ref 30.0–36.0)
MCV: 96.5 fL (ref 80.0–100.0)
Platelets: 251 10*3/uL (ref 150–400)
RBC: 2.84 MIL/uL — ABNORMAL LOW (ref 3.87–5.11)
RDW: 13.5 % (ref 11.5–15.5)
WBC: 15.5 10*3/uL — ABNORMAL HIGH (ref 4.0–10.5)
nRBC: 0 % (ref 0.0–0.2)

## 2019-01-29 LAB — BASIC METABOLIC PANEL
Anion gap: 10 (ref 5–15)
BUN: 50 mg/dL — ABNORMAL HIGH (ref 6–20)
CO2: 22 mmol/L (ref 22–32)
Calcium: 7.8 mg/dL — ABNORMAL LOW (ref 8.9–10.3)
Chloride: 96 mmol/L — ABNORMAL LOW (ref 98–111)
Creatinine, Ser: 1.74 mg/dL — ABNORMAL HIGH (ref 0.44–1.00)
GFR calc Af Amer: 37 mL/min — ABNORMAL LOW (ref >60)
GFR calc non Af Amer: 32 mL/min — ABNORMAL LOW (ref >60)
Glucose, Bld: 100 mg/dL — ABNORMAL HIGH (ref 70–99)
Potassium: 3.9 mmol/L (ref 3.5–5.1)
Sodium: 128 mmol/L — ABNORMAL LOW (ref 135–145)

## 2019-01-29 LAB — UA/M W/RFLX CULTURE, ROUTINE
Bilirubin, UA: NEGATIVE
Glucose, UA: NEGATIVE
Ketones, UA: NEGATIVE
Nitrite, UA: NEGATIVE
Specific Gravity, UA: <1.005 — ABNORMAL LOW (ref 1.005–1.030)
Urobilinogen, Ur: 0.2 mg/dL (ref 0.2–1.0)
pH, UA: 6 (ref 5.0–7.5)

## 2019-01-29 LAB — MICROSCOPIC EXAMINATION: WBC, UA: >30 /hpf — AB (ref 0–5)

## 2019-01-29 LAB — LIPASE, BLOOD: Lipase: 151 U/L — ABNORMAL HIGH (ref 11–51)

## 2019-01-29 LAB — URINE CULTURE, REFLEX

## 2019-01-29 MED ORDER — CIPROFLOXACIN HCL 250 MG PO TABS: 250.0000 mg | ORAL_TABLET | Freq: Two times a day (BID) | ORAL | 0 refills | Status: AC

## 2019-01-29 NOTE — Discharge Summary (Signed)
Jill Poole NAME: Jill Poole    MR#:  [id number]  DATE OF BIRTH:  08-Feb-1961  DATE OF ADMISSION:  01/28/2019 ADMITTING PHYSICIAN: Vaughan Basta, MD  DATE OF DISCHARGE: 01/29/2019  PRIMARY CARE PHYSICIAN: Valerie Roys, DO    ADMISSION DIAGNOSIS:  Acute hyponatremia [E87.1] AKI (acute kidney injury) ([place]) [N17.9] Abdominal pain, unspecified abdominal location [R10.9]  DISCHARGE DIAGNOSIS:  Principal Problem:   Acute renal failure (ARF) (HCC) Active Problems:   Hyponatremia   SECONDARY DIAGNOSIS:   Past Medical History:  Diagnosis Date  . Asthma   . Back pain   . Concussion   . COPD (chronic obstructive pulmonary disease) ([place])   . DDD (degenerative disc disease), lumbar   . Depression   . Post traumatic stress disorder (PTSD)     HOSPITAL COURSE:   58 year old female with a history of COPD who presented to the ER due to nausea and vomiting.  1.  Acute kidney injury in the setting of nausea and vomiting and poor p.o. intake This is improved with IV fluids.  2.  Hyponatremia in the setting of nausea vomiting and poor p.o. intake which has been previous IV fluids.  She needs repeat BMP with primary care physician in 3 days.  3.  Nausea and vomiting: Initially thought to be due to pancreatitis however with a normal CT scan and very minimally elevated lipase and symptoms not typical for pancreatitis this is unlikely.  Nausea and vomiting is likely due to gastroenteritis which is improved.  Patient tolerating her diet.  4  Hypertension: Continue Norvasc  5.  E. coli UTI from previous culture on May 22: Patient be discharged on oral ciprofloxacin for 3 more days. DISCHARGE CONDITIONS AND DIET:   Stable Regular diet  CONSULTS OBTAINED:    DRUG ALLERGIES:   Allergies  Allergen Reactions  . Adenosine   . Keflex [Cephalexin] Nausea And Vomiting  . Tramadol Nausea And Vomiting  . Codeine Nausea And  Vomiting and Other (See Comments)    Hot flashes, throwing up sick.  . Hydrocodone-Acetaminophen Nausea Only    DISCHARGE MEDICATIONS:   Allergies as of 01/29/2019      Reactions   Adenosine    Keflex [cephalexin] Nausea And Vomiting   Tramadol Nausea And Vomiting   Codeine Nausea And Vomiting, Other (See Comments)   Hot flashes, throwing up sick.   Hydrocodone-acetaminophen Nausea Only      Medication List    STOP taking these medications   nitrofurantoin (macrocrystal-monohydrate) 100 MG capsule Commonly known as:  Macrobid     TAKE these medications   acetaminophen 500 MG tablet Commonly known as:  TYLENOL Take 500-1,000 mg by mouth every 6 (six) hours as needed for mild pain, moderate pain or fever.   albuterol 108 (90 Base) MCG/ACT inhaler Commonly known as:  Proventil HFA INHALE 2 PUFFS BY MOUTH EVERY 6 HOURS AS NEEDED FOR WHEEZE OR SHORTNESS OF BREATH   aspirin 81 MG tablet Take 81 mg by mouth daily.   ciprofloxacin 250 MG tablet Commonly known as:  Cipro Take 1 tablet (250 mg total) by mouth 2 (two) times daily for 4 days.   cyclobenzaprine 10 MG tablet Commonly known as:  FLEXERIL Take 1 tablet (10 mg total) by mouth at bedtime. What changed:    when to take this  reasons to take this   FLUoxetine 40 MG capsule Commonly known as:  PROZAC Take 2 capsules (80 mg  total) by mouth daily.   fluticasone 50 MCG/ACT nasal spray Commonly known as:  FLONASE Place 1 spray into both nostrils daily as needed for allergies.   Fluticasone-Salmeterol 250-50 MCG/DOSE Aepb Commonly known as:  ADVAIR Inhale 1 puff into the lungs 2 (two) times daily.   gabapentin 300 MG capsule Commonly known as:  NEURONTIN Take 1 capsule (300 mg total) by mouth 3 (three) times daily for 30 days. What changed:    when to take this  reasons to take this   hydrOXYzine 25 MG tablet Commonly known as:  ATARAX/VISTARIL Take 1-2 tablets (25-50 mg total) by mouth every 8 (eight)  hours as needed. What changed:  reasons to take this   lisinopril 5 MG tablet Commonly known as:  ZESTRIL Take 1 tablet (5 mg total) by mouth daily.   LORazepam 0.5 MG tablet Commonly known as:  ATIVAN Take 1 tablet (0.5 mg total) by mouth 2 (two) times daily as needed for anxiety.   naproxen 500 MG tablet Commonly known as:  NAPROSYN Take 1 tablet (500 mg total) by mouth 2 (two) times daily with a meal. What changed:    when to take this  reasons to take this   omeprazole 40 MG capsule Commonly known as:  PRILOSEC Take 1 capsule (40 mg total) by mouth daily. What changed:    when to take this  reasons to take this   ondansetron 8 MG disintegrating tablet Commonly known as:  Zofran ODT Take 1 tablet (8 mg total) by mouth every 8 (eight) hours as needed.   traZODone 100 MG tablet Commonly known as:  DESYREL TAKE 1 TABLET(100 MG) BY MOUTH AT BEDTIME What changed:    how much to take  how to take this  when to take this  reasons to take this  additional instructions         Today   CHIEF COMPLAINT:  Wants to eat and go home   VITAL SIGNS:  Blood pressure 119/77, pulse 72, temperature 98.8 F (37.1 C), temperature source Oral, resp. rate 20, height 5\' 4"  (1.626 m), weight 49.4 kg, last menstrual period 06/17/2011, SpO2 96 %.   REVIEW OF SYSTEMS:  Review of Systems  Constitutional: Negative.  Negative for chills, fever and malaise/fatigue.  HENT: Negative.  Negative for ear discharge, ear pain, hearing loss, nosebleeds and sore throat.   Eyes: Negative.  Negative for blurred vision and pain.  Respiratory: Negative.  Negative for cough, hemoptysis, shortness of breath and wheezing.   Cardiovascular: Negative.  Negative for chest pain, palpitations and leg swelling.  Gastrointestinal: Negative.  Negative for abdominal pain, blood in stool, diarrhea, nausea and vomiting.  Genitourinary: Negative.  Negative for dysuria.  Musculoskeletal: Negative.   Negative for back pain.  Skin: Negative.   Neurological: Negative for dizziness, tremors, speech change, focal weakness, seizures and headaches.  Endo/Heme/Allergies: Negative.  Does not bruise/bleed easily.  Psychiatric/Behavioral: Negative.  Negative for depression, hallucinations and suicidal ideas.     PHYSICAL EXAMINATION:  GENERAL:  58 y.o.-year-old patient lying in the bed with no acute distress.  NECK:  Supple, no jugular venous distention. No thyroid enlargement, no tenderness.  LUNGS: Normal breath sounds bilaterally, no wheezing, rales,rhonchi  No use of accessory muscles of respiration.  CARDIOVASCULAR: S1, S2 normal. No murmurs, rubs, or gallops.  ABDOMEN: Soft, non-tender, non-distended. Bowel sounds present. No organomegaly or mass.  EXTREMITIES: No pedal edema, cyanosis, or clubbing.  PSYCHIATRIC: The patient is alert and oriented x 3.  SKIN: No obvious rash, lesion, or ulcer.   DATA REVIEW:   CBC Recent Labs  Lab 01/29/19 0501  WBC 15.5*  HGB 10.1*  HCT 27.4*  PLT 251    Chemistries  Recent Labs  Lab 01/28/19 1319 01/29/19 0501  NA 124* 128*  K 3.3* 3.9  CL 86* 96*  CO2 24 22  GLUCOSE 119* 100*  BUN 58* 50*  CREATININE 1.95* 1.74*  CALCIUM 8.6* 7.8*  AST 40  --   ALT 29  --   ALKPHOS 131*  --   BILITOT 0.9  --     Cardiac Enzymes No results for input(s): TROPONINI in the last 168 hours.  Microbiology Results  @MICRORSLT48 @  RADIOLOGY:  Ct Abdomen Pelvis Wo Contrast  Result Date: 01/28/2019 CLINICAL DATA:  Back pain and abdominal pain x4 days. Constipation x1 month. EXAM: CT ABDOMEN AND PELVIS WITHOUT CONTRAST TECHNIQUE: Multidetector CT imaging of the abdomen and pelvis was performed following the standard protocol without IV contrast. COMPARISON:  None. FINDINGS: Lower chest: No acute abnormality. Hepatobiliary: No focal liver abnormality is seen. No gallstones, gallbladder wall thickening, or biliary dilatation. Pancreas: Unremarkable. No  pancreatic ductal dilatation or surrounding inflammatory changes. Spleen: Normal in size without focal abnormality. Adrenals/Urinary Tract: Adrenal glands are unremarkable. Kidneys are normal, without renal calculi, focal lesion, or hydronephrosis. Bladder is unremarkable. Stomach/Bowel: Stomach is within normal limits. Appendix appears normal. No evidence of bowel wall thickening, distention, or inflammatory changes. Vascular/Lymphatic: Aortic atherosclerosis. No enlarged abdominal or pelvic lymph nodes. Reproductive: Uterus and bilateral adnexa are unremarkable. Other: No abdominal wall hernia or abnormality. No abdominopelvic ascites. Musculoskeletal: There are degenerative changes throughout the lumbar spine, greatest at the L2-L3 and L5-S1 levels. There is no displaced fracture. A moderate levoscoliosis is noted of the lumbar spine, centered at the L2-L3 level. The patient is status post bilateral pubic rami fractures, both of which have healed. IMPRESSION: 1. No acute intra-abdominal abnormality detected. No hydronephrosis. No radiopaque obstructing kidney stones. 2.  Aortic Atherosclerosis (ICD10-I70.0). Electronically Signed   By: Constance Holster M.D.   On: 01/28/2019 15:02      Allergies as of 01/29/2019      Reactions   Adenosine    Keflex [cephalexin] Nausea And Vomiting   Tramadol Nausea And Vomiting   Codeine Nausea And Vomiting, Other (See Comments)   Hot flashes, throwing up sick.   Hydrocodone-acetaminophen Nausea Only      Medication List    STOP taking these medications   nitrofurantoin (macrocrystal-monohydrate) 100 MG capsule Commonly known as:  Macrobid     TAKE these medications   acetaminophen 500 MG tablet Commonly known as:  TYLENOL Take 500-1,000 mg by mouth every 6 (six) hours as needed for mild pain, moderate pain or fever.   albuterol 108 (90 Base) MCG/ACT inhaler Commonly known as:  Proventil HFA INHALE 2 PUFFS BY MOUTH EVERY 6 HOURS AS NEEDED FOR WHEEZE  OR SHORTNESS OF BREATH   aspirin 81 MG tablet Take 81 mg by mouth daily.   ciprofloxacin 250 MG tablet Commonly known as:  Cipro Take 1 tablet (250 mg total) by mouth 2 (two) times daily for 4 days.   cyclobenzaprine 10 MG tablet Commonly known as:  FLEXERIL Take 1 tablet (10 mg total) by mouth at bedtime. What changed:    when to take this  reasons to take this   FLUoxetine 40 MG capsule Commonly known as:  PROZAC Take 2 capsules (80 mg total) by mouth daily.  fluticasone 50 MCG/ACT nasal spray Commonly known as:  FLONASE Place 1 spray into both nostrils daily as needed for allergies.   Fluticasone-Salmeterol 250-50 MCG/DOSE Aepb Commonly known as:  ADVAIR Inhale 1 puff into the lungs 2 (two) times daily.   gabapentin 300 MG capsule Commonly known as:  NEURONTIN Take 1 capsule (300 mg total) by mouth 3 (three) times daily for 30 days. What changed:    when to take this  reasons to take this   hydrOXYzine 25 MG tablet Commonly known as:  ATARAX/VISTARIL Take 1-2 tablets (25-50 mg total) by mouth every 8 (eight) hours as needed. What changed:  reasons to take this   lisinopril 5 MG tablet Commonly known as:  ZESTRIL Take 1 tablet (5 mg total) by mouth daily.   LORazepam 0.5 MG tablet Commonly known as:  ATIVAN Take 1 tablet (0.5 mg total) by mouth 2 (two) times daily as needed for anxiety.   naproxen 500 MG tablet Commonly known as:  NAPROSYN Take 1 tablet (500 mg total) by mouth 2 (two) times daily with a meal. What changed:    when to take this  reasons to take this   omeprazole 40 MG capsule Commonly known as:  PRILOSEC Take 1 capsule (40 mg total) by mouth daily. What changed:    when to take this  reasons to take this   ondansetron 8 MG disintegrating tablet Commonly known as:  Zofran ODT Take 1 tablet (8 mg total) by mouth every 8 (eight) hours as needed.   traZODone 100 MG tablet Commonly known as:  DESYREL TAKE 1 TABLET(100 MG) BY  MOUTH AT BEDTIME What changed:    how much to take  how to take this  when to take this  reasons to take this  additional instructions           Management plans discussed with the patient and she is in agreement. Stable for discharge home  Patient should follow up with pcp3  CODE STATUS:     Code Status Orders  (From admission, onward)         Start     Ordered   01/28/19 1700  Limited resuscitation (code)  Continuous    Question Answer Comment  In the event of cardiac or respiratory ARREST: Initiate Code Blue, Call Rapid Response Yes   In the event of cardiac or respiratory ARREST: Perform CPR Yes   In the event of cardiac or respiratory ARREST: Perform Intubation/Mechanical Ventilation No   In the event of cardiac or respiratory ARREST: Use NIPPV/BiPAp only if indicated Yes   In the event of cardiac or respiratory ARREST: Administer ACLS medications if indicated Yes   In the event of cardiac or respiratory ARREST: Perform Defibrillation or Cardioversion if indicated Yes      01/28/19 1659        Code Status History    Date Active Date Inactive Code Status Order ID Comments User Context   09/24/2013 2117 09/25/2013 1804 Full Code [id number]  Nat Christen, MD ED      TOTAL TIME TAKING CARE OF THIS PATIENT: 38 minutes.    Note: This dictation was prepared with Dragon dictation along with smaller phrase technology. Any transcriptional errors that result from this process are unintentional.  Bettey Costa M.D on 01/29/2019 at 10:16 AM  Between 7am to 6pm - Pager - [phone] After 6pm go to [email or web address] - password [email or web address] [facility]  Office  [phone]  CC: Primary  care physician; Valerie Roys, DO

## 2019-01-30 ENCOUNTER — Encounter: Payer: Self-pay | Admitting: *Deleted

## 2019-01-30 ENCOUNTER — Other Ambulatory Visit: Payer: Self-pay

## 2019-01-30 ENCOUNTER — Ambulatory Visit: Payer: Self-pay | Admitting: Licensed Clinical Social Worker

## 2019-01-30 DIAGNOSIS — F4321 Adjustment disorder with depressed mood: Secondary | ICD-10-CM

## 2019-01-30 DIAGNOSIS — F419 Anxiety disorder, unspecified: Secondary | ICD-10-CM

## 2019-01-30 LAB — HIV ANTIBODY (ROUTINE TESTING W REFLEX): HIV Screen 4th Generation wRfx: NONREACTIVE

## 2019-01-30 NOTE — Chronic Care Management (AMB) (Signed)
  Care Management   Follow Up Note   01/30/2019 Name: Jill Poole MRN: [id number] DOB: 11-15-1960  Referred by: Valerie Roys, DO Reason for referral : Chronic Care Management   Jill Poole is a 58 y.o. year old female who is a primary care patient of Valerie Roys, DO. The care management team was consulted for assistance with care management and care coordination needs.    Review of patient status, including review of consultants reports, relevant laboratory and other test results, and collaboration with appropriate care team members and the patient's provider was performed as part of comprehensive patient evaluation and provision of chronic care management services.    Goals Addressed    . "I need help with grief" (pt-stated)       Current Barriers:  Marland Kitchen Mental Health Concerns - patient requests grief counseling resources (recent loss of loved one by violence)  Clinical Social Work Clinical Goal(s):  Marland Kitchen Over the next 90 days, client will work with SW to address concerns related to grief counseling - LCSW to follow up with patient over the next 90 days to ensure that patient is connected to community grief counseling resource.  . Over the next 90 days, patient will work with community grief counselor [place] [facility] [phone] to address needs related to grief/loss.  Interventions: . Patient interviewed and appropriate assessments performed . Patient was discharged from the ED yesterday and has a hospital follow up appointment with PCP on 02/05/2019 . Patient reports successfully implementing appropriate self-care methods into her daily routine such as having "appreciating and honoring time to herself now that her roommate has moved out, talking to friends when she is stressed and eating healthy.  . Provided patient with information about community grief counseling resources . Discussed plans with patient for ongoing care management  follow up and provided patient with direct contact information for care management team . Advised patient to to keep up with future grief counseling appointments. Patient admits that she missed her grief counselor's last phone call because she was not at a home. Patient has still not returned this cal back to counselor to resume services. Patient was encouraged to contact [facility] back and reschedule session and call [phone]. Marland Kitchen Assisted patient/caregiver with obtaining information about health plan benefits  Patient Self Care Activities:  . Currently UNABLE TO independently self manage grief process  Please see past updates related to this goal by clicking on the "Past Updates" button in the selected goal     The care management team will reach out to the patient again over the next 30 days.  Follow up with provider re: hospital follow up on 02/05/2019  Eula Fried, [place], MSW, [email or web address] Phone: [phone]

## 2019-02-02 LAB — CULTURE, BLOOD (ROUTINE X 2)
Culture: NO GROWTH
Culture: NO GROWTH
Special Requests: ADEQUATE

## 2019-02-04 ENCOUNTER — Telehealth: Payer: Self-pay | Admitting: Family Medicine

## 2019-02-04 NOTE — Telephone Encounter (Signed)
Copied from [place] [id number]. Topic: Quick Communication - Appointment Cancellation >> Feb 04, 2019 10:15 AM Rainey Pines A wrote: Patient called to cancel appointment scheduled for 02/05/2019. Patient will call back to reschedule visit.

## 2019-02-05 ENCOUNTER — Inpatient Hospital Stay: Payer: Medicaid Other | Admitting: Family Medicine

## 2019-02-15 ENCOUNTER — Other Ambulatory Visit: Payer: Self-pay | Admitting: Family Medicine

## 2019-02-20 ENCOUNTER — Telehealth: Payer: Self-pay

## 2019-02-20 ENCOUNTER — Ambulatory Visit: Payer: Self-pay | Admitting: Licensed Clinical Social Worker

## 2019-02-20 NOTE — Chronic Care Management (AMB) (Signed)
  Care Management   Follow Up Note   02/20/2019 Name: Jill Poole MRN: [id number] DOB: [birth year]  Referred by: Valerie Roys, DO Reason for referral : Care Coordination   [name] is a 58 y.o. year old female who is a primary care patient of Valerie Roys, DO. The care management team was consulted for assistance with care management and care coordination needs.    Review of patient status, including review of consultants reports, relevant laboratory and other test results, and collaboration with appropriate care team members and the patient's provider was performed as part of comprehensive patient evaluation and provision of chronic care management services.    LCSW completed CCM outreach attempt today but was unable to reach patient successfully. A HIPPA compliant voice message was left encouraging patient to return call once available. LCSW rescheduled CCM SW appointment as well.  The care management team will reach out to the patient again next month.    Eula Fried, BSW, MSW, [facility] Care Management [email or web address] Phone: [phone]

## 2019-02-24 ENCOUNTER — Encounter: Payer: Self-pay | Admitting: Family Medicine

## 2019-02-24 ENCOUNTER — Ambulatory Visit (INDEPENDENT_AMBULATORY_CARE_PROVIDER_SITE_OTHER): Payer: Medicaid Other | Admitting: Family Medicine

## 2019-02-24 ENCOUNTER — Other Ambulatory Visit: Payer: Self-pay

## 2019-02-24 VITALS — BP 128/88 | HR 71 | Temp 98.9°F | Ht 64.0 in | Wt 109.0 lb

## 2019-02-24 DIAGNOSIS — N179 Acute kidney failure, unspecified: Secondary | ICD-10-CM

## 2019-02-24 DIAGNOSIS — I10 Essential (primary) hypertension: Secondary | ICD-10-CM | POA: Diagnosis not present

## 2019-02-24 DIAGNOSIS — E871 Hypo-osmolality and hyponatremia: Secondary | ICD-10-CM | POA: Diagnosis not present

## 2019-02-24 DIAGNOSIS — Z23 Encounter for immunization: Secondary | ICD-10-CM

## 2019-02-24 NOTE — Progress Notes (Signed)
BP 128/88   Pulse 71   Temp 98.9 F (37.2 C) (Oral)   Ht 5\' 4"  (1.626 m)   Wt 109 lb (49.4 kg)   LMP 06/17/2011   SpO2 98%   BMI 18.71 kg/m    Subjective:    Patient ID: Jill Poole, female    DOB: 1961/04/05, 58 y.o.   MRN: [id number]  HPI: Karan Inclan is a 58 y.o. female  Chief Complaint  Patient presents with  . Hospitalization Follow-up   HOSPITAL FOLLOW UP Time since discharge: > 1 month Hospital/facility: [facility] Diagnosis: AKI, hyponatremia  Procedures/tests: Labs , CT abdomen Consultants: None New medications: None Discharge instructions: Follow up here  Status: better  HYPERTENSION Hypertension status: controlled  Satisfied with current treatment? yes Duration of hypertension: chronic BP monitoring frequency:  not checking BP medication side effects:  no Medication compliance: excellent compliance Previous BP meds: lisinopril Aspirin: no Recurrent headaches: no Visual changes: no Palpitations: no Dyspnea: no Chest pain: no Lower extremity edema: no Dizzy/lightheaded: no  Relevant past medical, surgical, family and social history reviewed and updated as indicated. Interim medical history since our last visit reviewed. Allergies and medications reviewed and updated.  Review of Systems  Constitutional: Negative.   Respiratory: Negative.   Cardiovascular: Negative.   Neurological: Negative.   Psychiatric/Behavioral: Negative.     Per HPI unless specifically indicated above     Objective:    BP 128/88   Pulse 71   Temp 98.9 F (37.2 C) (Oral)   Ht 5\' 4"  (1.626 m)   Wt 109 lb (49.4 kg)   LMP 06/17/2011   SpO2 98%   BMI 18.71 kg/m   Wt Readings from Last 3 Encounters:  02/24/19 109 lb (49.4 kg)  01/28/19 108 lb 14.5 oz (49.4 kg)  01/27/19 109 lb (49.4 kg)    Physical Exam Vitals signs and nursing note reviewed.  Constitutional:      General: She is not in acute distress.    Appearance: Normal appearance. She is not  ill-appearing, toxic-appearing or diaphoretic.  HENT:     Head: Normocephalic and atraumatic.     Right Ear: External ear normal.     Left Ear: External ear normal.     Nose: Nose normal.     Mouth/Throat:     Mouth: Mucous membranes are moist.     Pharynx: Oropharynx is clear.  Eyes:     General: No scleral icterus.       Right eye: No discharge.        Left eye: No discharge.     Extraocular Movements: Extraocular movements intact.     Conjunctiva/sclera: Conjunctivae normal.     Pupils: Pupils are equal, round, and reactive to light.  Neck:     Musculoskeletal: Normal range of motion and neck supple.  Cardiovascular:     Rate and Rhythm: Normal rate and regular rhythm.     Pulses: Normal pulses.     Heart sounds: Normal heart sounds. No murmur. No friction rub. No gallop.   Pulmonary:     Effort: Pulmonary effort is normal. No respiratory distress.     Breath sounds: Normal breath sounds. No stridor. No wheezing, rhonchi or rales.  Chest:     Chest wall: No tenderness.  Musculoskeletal: Normal range of motion.  Skin:    General: Skin is warm and dry.     Capillary Refill: Capillary refill takes less than 2 seconds.     Coloration: Skin is not  jaundiced or pale.     Findings: No bruising, erythema, lesion or rash.  Neurological:     General: No focal deficit present.     Mental Status: She is alert and oriented to person, place, and time. Mental status is at baseline.  Psychiatric:        Mood and Affect: Mood normal.        Behavior: Behavior normal.        Thought Content: Thought content normal.        Judgment: Judgment normal.     Results for orders placed or performed in visit on 08/65/78  Basic metabolic panel  Result Value Ref Range   Glucose 93 65 - 99 mg/dL   BUN 14 6 - 24 mg/dL   Creatinine, Ser 1.28 (H) 0.57 - 1.00 mg/dL   GFR calc non Af Amer 47 (L) >59 mL/min/1.73   GFR calc Af Amer 54 (L) >59 mL/min/1.73   BUN/Creatinine Ratio 11 9 - 23   Sodium  130 (L) 134 - 144 mmol/L   Potassium 4.8 3.5 - 5.2 mmol/L   Chloride 95 (L) 96 - 106 mmol/L   CO2 19 (L) 20 - 29 mmol/L   Calcium 9.2 8.7 - 10.2 mg/dL      Assessment & Plan:   Problem List Items Addressed This Visit      Cardiovascular and Mediastinum   HTN (hypertension) - Primary    Under good control on current regimen. Continue current regimen. Continue to monitor. Call with any concerns. Refills given. Labs checked today.       Relevant Orders   Basic metabolic panel (Completed)     Other   Hyponatremia    Rechecking labs today. Treat as needed.       Other Visit Diagnoses    Need for diphtheria-tetanus-pertussis (Tdap) vaccine       Tdap given today.    Relevant Orders   Tdap vaccine greater than or equal to 7yo IM (Completed)   AKI (acute kidney injury) ([place])       Rechecking labs today. Treat as needed. Continue pushing fluids. Call with any concerns.        Follow up plan: Return in about 4 weeks (around 03/24/2019).

## 2019-02-25 LAB — BASIC METABOLIC PANEL
BUN/Creatinine Ratio: 11 (ref 9–23)
BUN: 14 mg/dL (ref 6–24)
CO2: 19 mmol/L — ABNORMAL LOW (ref 20–29)
Calcium: 9.2 mg/dL (ref 8.7–10.2)
Chloride: 95 mmol/L — ABNORMAL LOW (ref 96–106)
Creatinine, Ser: 1.28 mg/dL — ABNORMAL HIGH (ref 0.57–1.00)
GFR calc Af Amer: 54 mL/min/{1.73_m2} — ABNORMAL LOW (ref >59)
GFR calc non Af Amer: 47 mL/min/{1.73_m2} — ABNORMAL LOW (ref >59)
Glucose: 93 mg/dL (ref 65–99)
Potassium: 4.8 mmol/L (ref 3.5–5.2)
Sodium: 130 mmol/L — ABNORMAL LOW (ref 134–144)

## 2019-02-26 ENCOUNTER — Telehealth: Payer: Self-pay | Admitting: Family Medicine

## 2019-02-26 DIAGNOSIS — N289 Disorder of kidney and ureter, unspecified: Secondary | ICD-10-CM

## 2019-02-26 NOTE — Telephone Encounter (Signed)
Attempted to reach pt x 2, line buys.

## 2019-02-26 NOTE — Telephone Encounter (Signed)
Attempted to reach pt again. Line still busy.

## 2019-02-26 NOTE — Telephone Encounter (Signed)
Please let her know that her kidney function is better, but still not normal. Keep drinking plenty of water and we'll recheck it in 1 month.

## 2019-02-27 NOTE — Telephone Encounter (Signed)
Tried calling number on file. Line still busy. Contacted patient contact [name]. (no information given to [name]) He gave me numbers  [phone] but that number has been changed, disconnected or no longer in service and he gave me number [phone]. This number worked and patient stated this was her only phone number. [phone] removed from patients chart.  Patient also requested to remove [name] as her contact and add [name]. Patient contact changed as requested.

## 2019-02-28 ENCOUNTER — Encounter: Payer: Self-pay | Admitting: Family Medicine

## 2019-02-28 NOTE — Assessment & Plan Note (Signed)
Under good control on current regimen. Continue current regimen. Continue to monitor. Call with any concerns. Refills given. Labs checked today.  

## 2019-02-28 NOTE — Assessment & Plan Note (Signed)
Rechecking labs today. Treat as needed.

## 2019-03-18 ENCOUNTER — Ambulatory Visit: Payer: Self-pay | Admitting: Licensed Clinical Social Worker

## 2019-03-18 ENCOUNTER — Telehealth: Payer: Self-pay

## 2019-03-18 NOTE — Chronic Care Management (AMB) (Signed)
  Chronic Care Management    Clinical Social Work Follow Up Note  03/18/2019 Name: Cyann Venti MRN: [id number] DOB: 03-06-1961  Diania Co is a 58 y.o. year old female who is a primary care patient of Valerie Roys, DO. The CCM team was consulted for assistance with Mental Health Counseling and Resources and Grief Counseling.   Review of patient status, including review of consultants reports, other relevant assessments, and collaboration with appropriate care team members and the patient's provider was performed as part of comprehensive patient evaluation and provision of chronic care management services.     LCSW completed CCM outreach attempt today but was unable to reach patient successfully. A HIPPA compliant voice message was left encouraging patient to return call once available. LCSW rescheduled CCM SW appointment as well.  Follow Up Plan: SW will follow up with patient by phone over the next month  Eula Fried, [place], MSW, [email or web address] Phone: [phone]

## 2019-03-21 ENCOUNTER — Telehealth: Payer: Self-pay | Admitting: Family Medicine

## 2019-03-22 NOTE — Telephone Encounter (Signed)
Received after hours nurse triage call on 03/21/19 afternoon regarding patient, Blonnie Maske DOB Aug 07, 1961. She called nurse call line reporting symptoms of "severe difficulty breathing" and felt like "throat was closing". She requested rx for thrush, and reported she has had this before.  The initial response from nursing triage was to call 911 and recommend ED services, patient declined this service and requested to speak with provider.  I advised the nursing coverage staff that I agreed with their initial recommendation for 911 and ED evaluation for acute symptoms of dyspnea and possible airway closing by report.  I declined to order the thrush medication, as this is not typically something that I would use to treat those symptoms, and without being able to examine patient.  Nursing coverage was to advise patient again that I recommended 911 / ED services and if she declines again, then next other option today on Saturday was to go to Urgent Care for immediate medical attention / triage in person.  Nobie Putnam, [facility]

## 2019-03-28 ENCOUNTER — Other Ambulatory Visit: Payer: Self-pay | Admitting: Family Medicine

## 2019-03-28 NOTE — Telephone Encounter (Signed)
Requested medication (s) are due for refill today: yes  Requested medication (s) are on the active medication list: yes  Last refill:  01/12/19  Future visit scheduled: yes  Notes to clinic:  Medication not delegated to NT to refill   Requested Prescriptions  Pending Prescriptions Disp Refills   LORazepam (ATIVAN) 0.5 MG tablet [Pharmacy Med Name: LORAZEPAM 0.5 MG TABLET] 60 tablet 0    Sig: Take 1 tablet (0.5 mg total) by mouth 2 (two) times daily as needed for anxiety.     Not Delegated - Psychiatry:  Anxiolytics/Hypnotics Failed - 03/28/2019  1:31 PM      Failed - This refill cannot be delegated      Failed - Urine Drug Screen completed in last 360 days.      Passed - Valid encounter within last 6 months    Recent Outpatient Visits          1 month ago Essential hypertension   [place], [place], DO   2 months ago Essential hypertension   [place], Megan P, DO   2 months ago Moderate episode of recurrent major depressive disorder [facility])   [place], Megan P, DO   3 months ago Moderate episode of recurrent major depressive disorder ([place])   [place], Megan P, DO   3 months ago Infected sebaceous cyst   [facility] Valerie Roys, DO      Future Appointments            In 1 month Johnson, Barb Merino, DO [employer], [facility]

## 2019-03-30 ENCOUNTER — Telehealth: Payer: Self-pay | Admitting: Family Medicine

## 2019-03-30 MED ORDER — LORAZEPAM 0.5 MG PO TABS: 0.5000 mg | ORAL_TABLET | Freq: Two times a day (BID) | ORAL | 0 refills | Status: DC | PRN

## 2019-03-30 NOTE — Telephone Encounter (Signed)
Copied from [place] [phone]. Topic: Quick Communication - Rx Refill/Question >> Mar 30, 2019  9:24 AM Leward Quan A wrote: Medication: LORazepam (ATIVAN) 0.5 MG tablet  Has the patient contacted their pharmacy? Yes.   (Agent: If no, request that the patient contact the pharmacy for the refill.) (Agent: If yes, when and what did the pharmacy advise?)  Preferred Pharmacy (with phone number or street name): [id number] - [place], [phone] (Phone) [phone] (Fax)    Agent: Please be advised that RX refills may take up to 3 business days. We ask that you follow-up with your pharmacy.

## 2019-03-30 NOTE — Telephone Encounter (Signed)
Routing to provider  

## 2019-04-08 ENCOUNTER — Other Ambulatory Visit: Payer: Self-pay | Admitting: Family Medicine

## 2019-04-08 NOTE — Telephone Encounter (Signed)
Requested medications are due for refill today?  Yes  Requested medications are on the active medication list?  Yes  Last refill - 10/17/2018  Future visit scheduled?  Yes - 04/27/2019  Notes to clinic   Patient reports taking differently on both medications.  Gabapentin directions states take for 30 days.    Requested Prescriptions  Pending Prescriptions Disp Refills   gabapentin (NEURONTIN) 300 MG capsule [Pharmacy Med Name: GABAPENTIN 300 MG CAPSULE] 270 capsule 1    Sig: Take 1 capsule (300 mg total) by mouth 3 (three) times daily for 30 days.     Neurology: Anticonvulsants - gabapentin Passed - 04/08/2019 10:14 AM      Passed - Valid encounter within last 12 months    Recent Outpatient Visits          1 month ago Essential hypertension   [place], Megan P, DO   2 months ago Essential hypertension   [place], Megan P, DO   2 months ago Moderate episode of recurrent major depressive disorder ([place])   [place], Megan P, DO   3 months ago Moderate episode of recurrent major depressive disorder ([place])   [place], Megan P, DO   3 months ago Infected sebaceous cyst   [facility] [place], [place], DO      Future Appointments            In 2 weeks Johnson, Megan P, DO [place], [facility]            traZODone (DESYREL) 100 MG tablet [employer] Med Name: TRAZODONE 100 MG TABLET] 90 tablet 1    Sig: TAKE 1 TABLET(100 MG) BY MOUTH AT BEDTIME     Psychiatry: Antidepressants - Serotonin Modulator Passed - 04/08/2019 10:14 AM      Passed - Valid encounter within last 6 months    Recent Outpatient Visits          1 month ago Essential hypertension   [place], Megan P, DO   2 months ago Essential hypertension   [place], Megan P, DO   2 months ago Moderate episode of recurrent major depressive disorder ([place])   [place], Megan P, DO   3 months ago Moderate episode of recurrent major depressive disorder ([place])   [place], Megan P, DO   3 months ago Infected sebaceous cyst   [place], DO      Future Appointments            In 2 weeks Wynetta Emery, Megan P, DO [place], [facility]           Passed - Completed PHQ-2 or PHQ-9 in the last 360 days.

## 2019-04-08 NOTE — Telephone Encounter (Signed)
Routing to provider for refill

## 2019-04-24 ENCOUNTER — Ambulatory Visit: Payer: Self-pay | Admitting: Licensed Clinical Social Worker

## 2019-04-24 ENCOUNTER — Other Ambulatory Visit: Payer: Self-pay

## 2019-04-24 DIAGNOSIS — F419 Anxiety disorder, unspecified: Secondary | ICD-10-CM

## 2019-04-24 DIAGNOSIS — F4321 Adjustment disorder with depressed mood: Secondary | ICD-10-CM

## 2019-04-24 NOTE — Chronic Care Management (AMB) (Signed)
  Care Management   Follow Up Note   04/24/2019 Name: Jill Poole MRN: [id number] DOB: [birth year]  Referred by: Valerie Roys, DO Reason for referral : Care Coordination   Jill Poole is a 58 y.o. year old female who is a primary care patient of Valerie Roys, DO. The care management team was consulted for assistance with care management and care coordination needs.    Review of patient status, including review of consultants reports, relevant laboratory and other test results, and collaboration with appropriate care team members and the patient's provider was performed as part of comprehensive patient evaluation and provision of chronic care management services.    Goals Addressed    . "I need help with grief" (pt-stated)       Current Barriers:  Marland Kitchen Mental Health Concerns - patient requests grief counseling resources (recent loss of loved one by violence)  Clinical Social Work Clinical Goal(s):  Marland Kitchen Over the next 90 days, client will work with SW to address concerns related to grief counseling - LCSW to follow up with patient over the next 90 days to ensure that patient is connected to community grief counseling resource.  . Over the next 90 days, patient will work with community grief counselor [place] [facility] [place] [phone] to address needs related to grief/loss.  Interventions: . Patient interviewed and appropriate assessments performed . Patient was provided emotional support over the phone as her close friend was in a traumatic car accident last week. . Patient reports successfully implementing appropriate self-care methods into her daily routine such as having "appreciating and honoring time to herself now that her roommate has moved out, talking to friends when she is stressed and eating healthy.  . Provided patient with information about community grief counseling resources . Discussed plans with patient for ongoing care  management follow up and provided patient with direct contact information for care management team . Advised patient to to keep up with future grief counseling appointments. Patient admits that she missed several calls from grief counselor. LCSW informed her of [facility]'s no show policy. Patient has still not returned this cal back to counselor to resume services. Patient was encouraged to contact [facility] back and reschedule session/resume services. Contact [phone]. Marland Kitchen Assisted patient/caregiver with obtaining information about health plan benefits  Patient Self Care Activities:  . Currently UNABLE TO independently self manage grief process  Please see past updates related to this goal by clicking on the "Past Updates" button in the selected goal     The care management team will reach out to the patient again over the next 60 days.   Eula Fried, BSW, MSW, [facility] Care Management [email or web address] Phone: [phone]

## 2019-04-25 ENCOUNTER — Other Ambulatory Visit: Payer: Self-pay | Admitting: Family Medicine

## 2019-04-27 ENCOUNTER — Ambulatory Visit: Payer: Medicaid Other | Admitting: Family Medicine

## 2019-04-27 ENCOUNTER — Other Ambulatory Visit: Payer: Self-pay

## 2019-04-27 ENCOUNTER — Encounter: Payer: Self-pay | Admitting: Family Medicine

## 2019-04-27 VITALS — BP 127/84 | HR 88 | Temp 98.4°F | Ht 64.0 in | Wt 113.0 lb

## 2019-04-27 DIAGNOSIS — F331 Major depressive disorder, recurrent, moderate: Secondary | ICD-10-CM

## 2019-04-27 DIAGNOSIS — N289 Disorder of kidney and ureter, unspecified: Secondary | ICD-10-CM

## 2019-04-27 DIAGNOSIS — N179 Acute kidney failure, unspecified: Secondary | ICD-10-CM | POA: Diagnosis not present

## 2019-04-27 DIAGNOSIS — I10 Essential (primary) hypertension: Secondary | ICD-10-CM

## 2019-04-27 DIAGNOSIS — F419 Anxiety disorder, unspecified: Secondary | ICD-10-CM

## 2019-04-27 DIAGNOSIS — J449 Chronic obstructive pulmonary disease, unspecified: Secondary | ICD-10-CM | POA: Diagnosis not present

## 2019-04-27 MED ORDER — LORAZEPAM 0.5 MG PO TABS: 0.5000 mg | ORAL_TABLET | Freq: Two times a day (BID) | ORAL | 1 refills | Status: DC | PRN

## 2019-04-27 MED ORDER — ALBUTEROL SULFATE HFA 108 (90 BASE) MCG/ACT IN AERS: INHALATION_SPRAY | RESPIRATORY_TRACT | 2 refills | Status: DC

## 2019-04-27 MED ORDER — HYDROXYZINE HCL 25 MG PO TABS: 25.0000 mg | ORAL_TABLET | Freq: Three times a day (TID) | ORAL | 1 refills | Status: DC | PRN

## 2019-04-27 NOTE — Progress Notes (Signed)
BP 127/84   Pulse 88   Temp 98.4 F (36.9 C) (Oral)   Ht 5\' 4"  (1.626 m)   Wt 113 lb (51.3 kg)   LMP 06/17/2011   SpO2 99%   BMI 19.40 kg/m    Subjective:    Patient ID: Jill Poole, female    DOB: 12/06/1960, 58 y.o.   MRN: [id number]  HPI: Jill Poole is a 58 y.o. female  Chief Complaint  Patient presents with  . Hypertension  . AKI  . Anxiety   ANXIETY/STRESS Duration:stable Anxious mood: yes  Excessive worrying: no Irritability: yes  Sweating: no Nausea: no Palpitations:no Hyperventilation: no Panic attacks: no Agoraphobia: no  Obscessions/compulsions: no Depressed mood: yes Depression screen [facility] 2/9 04/27/2019 01/27/2019 01/12/2019 12/23/2018 12/05/2018  Decreased Interest 1 0 0 0 3  Down, Depressed, Hopeless 1 0 2 0 3  PHQ - 2 Score 2 0 2 0 6  Altered sleeping 1 1 0 0 3  Tired, decreased energy 1 3 0 0 3  Change in appetite 0 3 0 0 3  Feeling bad or failure about yourself  0 0 0 0 2  Trouble concentrating 1 1 0 0 3  Moving slowly or fidgety/restless 0 1 0 0 3  Suicidal thoughts 0 0 0 0 1  PHQ-9 Score 5 9 2  0 24  Difficult doing work/chores Somewhat difficult - Not difficult at all - -  Some recent data might be hidden   GAD 7 : Generalized Anxiety Score 04/27/2019 02/24/2019 01/27/2019 01/12/2019  Nervous, Anxious, on Edge 1 1 1 3   Control/stop worrying 1 1 0 3  Worry too much - different things 2 0 0 3  Trouble relaxing 2 0 0 3  Restless 1 2 0 3  Easily annoyed or irritable 1 0 1 3  Afraid - awful might happen 1 0 0 0  Total GAD 7 Score 9 4 2 18   Anxiety Difficulty Somewhat difficult Somewhat difficult - Somewhat difficult   Anhedonia: no Weight changes: no Insomnia: yes   Hypersomnia: no Fatigue/loss of energy: yes Feelings of worthlessness: no Feelings of guilt: no Impaired concentration/indecisiveness: no Suicidal ideations: no  Crying spells: no Recent Stressors/Life Changes: yes   Relationship problems: yes   Family stress: no    Financial stress: no    Job stress: no    Recent death/loss: yes   HYPERTENSION Hypertension status: controlled  Satisfied with current treatment? yes Duration of hypertension: chronic BP monitoring frequency:  not checking BP range:  BP medication side effects:  no Medication compliance: good compliance Previous BP meds: lisinopril Aspirin: yes Recurrent headaches: no Visual changes: no Palpitations: no Dyspnea: no Chest pain: no Lower extremity edema: no Dizzy/lightheaded: no  Breathing has been doing well. No SOB. Using inhalers as prescribed. No other concerns or complaints at this time.   Relevant past medical, surgical, family and social history reviewed and updated as indicated. Interim medical history since our last visit reviewed. Allergies and medications reviewed and updated.  Review of Systems  Constitutional: Negative.   Respiratory: Negative.   Cardiovascular: Negative.   Musculoskeletal: Negative.   Skin: Negative.   Psychiatric/Behavioral: Positive for sleep disturbance. Negative for agitation, behavioral problems, confusion, decreased concentration, dysphoric mood, hallucinations, self-injury and suicidal ideas. The patient is nervous/anxious. The patient is not hyperactive.     Per HPI unless specifically indicated above     Objective:    BP 127/84   Pulse 88   Temp 98.4 F (  36.9 C) (Oral)   Ht 5\' 4"  (1.626 m)   Wt 113 lb (51.3 kg)   LMP 06/17/2011   SpO2 99%   BMI 19.40 kg/m   Wt Readings from Last 3 Encounters:  04/27/19 113 lb (51.3 kg)  02/24/19 109 lb (49.4 kg)  01/28/19 108 lb 14.5 oz (49.4 kg)    Physical Exam Vitals signs and nursing note reviewed.  Constitutional:      General: She is not in acute distress.    Appearance: Normal appearance. She is not ill-appearing, toxic-appearing or diaphoretic.  HENT:     Head: Normocephalic and atraumatic.     Right Ear: External ear normal.     Left Ear: External ear normal.     Nose:  Nose normal.     Mouth/Throat:     Mouth: Mucous membranes are moist.     Pharynx: Oropharynx is clear.  Eyes:     General: No scleral icterus.       Right eye: No discharge.        Left eye: No discharge.     Extraocular Movements: Extraocular movements intact.     Conjunctiva/sclera: Conjunctivae normal.     Pupils: Pupils are equal, round, and reactive to light.  Neck:     Musculoskeletal: Normal range of motion and neck supple.  Cardiovascular:     Rate and Rhythm: Normal rate and regular rhythm.     Pulses: Normal pulses.     Heart sounds: Normal heart sounds. No murmur. No friction rub. No gallop.   Pulmonary:     Effort: Pulmonary effort is normal. No respiratory distress.     Breath sounds: Normal breath sounds. No stridor. No wheezing, rhonchi or rales.  Chest:     Chest wall: No tenderness.  Musculoskeletal: Normal range of motion.  Skin:    General: Skin is warm and dry.     Capillary Refill: Capillary refill takes less than 2 seconds.     Coloration: Skin is not jaundiced or pale.     Findings: No bruising, erythema, lesion or rash.  Neurological:     General: No focal deficit present.     Mental Status: She is alert and oriented to person, place, and time. Mental status is at baseline.  Psychiatric:        Mood and Affect: Mood normal.        Behavior: Behavior normal.        Thought Content: Thought content normal.        Judgment: Judgment normal.     Results for orders placed or performed in visit on [id number]  Basic metabolic panel  Result Value Ref Range   Glucose 93 65 - 99 mg/dL   BUN 14 6 - 24 mg/dL   Creatinine, Ser 1.28 (H) 0.57 - 1.00 mg/dL   GFR calc non Af Amer 47 (L) >59 mL/min/1.73   GFR calc Af Amer 54 (L) >59 mL/min/1.73   BUN/Creatinine Ratio 11 9 - 23   Sodium 130 (L) 134 - 144 mmol/L   Potassium 4.8 3.5 - 5.2 mmol/L   Chloride 95 (L) 96 - 106 mmol/L   CO2 19 (L) 20 - 29 mmol/L   Calcium 9.2 8.7 - 10.2 mg/dL      Assessment & Plan:    Problem List Items Addressed This Visit      Cardiovascular and Mediastinum   HTN (hypertension)    Under good control on current regimen. Continue current regimen. Continue to  monitor. Call with any concerns. Refills given. Labs drawn today.       Relevant Orders   Basic metabolic panel     Respiratory   COPD (chronic obstructive pulmonary disease) ([place])    Under good control on current regimen. Continue current regimen. Continue to monitor. Call with any concerns. Refills given.        Relevant Medications   albuterol (PROAIR HFA) 108 (90 Base) MCG/ACT inhaler     Genitourinary   Acute renal failure (ARF) (HCC)    Rechecking labs today. Await results. Call with any concerns.         Other   Anxiety - Primary    Encouraged patient to take her hydroxyzine as needed and hold her lorazepam only for when she really needs it. 60 pills given for 90 days. Will check in in 3 months. Call with any concerns.       Relevant Medications   LORazepam (ATIVAN) 0.5 MG tablet   hydrOXYzine (ATARAX/VISTARIL) 25 MG tablet   Moderate episode of recurrent major depressive disorder (HCC)    Stable. Doing a bit better. Continue prozac. Continue to monitor. Refills given today.      Relevant Medications   LORazepam (ATIVAN) 0.5 MG tablet   hydrOXYzine (ATARAX/VISTARIL) 25 MG tablet    Other Visit Diagnoses    Abnormal kidney function       Relevant Orders   Basic metabolic panel       Follow up plan: Return in about 3 months (around 07/28/2019) for follow up mood.

## 2019-04-27 NOTE — Assessment & Plan Note (Signed)
Stable. Doing a bit better. Continue prozac. Continue to monitor. Refills given today.

## 2019-04-27 NOTE — Assessment & Plan Note (Signed)
Encouraged patient to take her hydroxyzine as needed and hold her lorazepam only for when she really needs it. 60 pills given for 90 days. Will check in in 3 months. Call with any concerns.

## 2019-04-27 NOTE — Assessment & Plan Note (Signed)
Under good control on current regimen. Continue current regimen. Continue to monitor. Call with any concerns. Refills given. Labs drawn today.   

## 2019-04-27 NOTE — Assessment & Plan Note (Signed)
Rechecking labs today. Await results. Call with any concerns.  

## 2019-04-27 NOTE — Assessment & Plan Note (Signed)
Under good control on current regimen. Continue current regimen. Continue to monitor. Call with any concerns. Refills given.   

## 2019-04-28 ENCOUNTER — Telehealth: Payer: Self-pay | Admitting: Family Medicine

## 2019-04-28 ENCOUNTER — Encounter: Payer: Self-pay | Admitting: Family Medicine

## 2019-04-28 DIAGNOSIS — E875 Hyperkalemia: Secondary | ICD-10-CM

## 2019-04-28 LAB — BASIC METABOLIC PANEL
BUN/Creatinine Ratio: 12 (ref 9–23)
BUN: 12 mg/dL (ref 6–24)
CO2: 25 mmol/L (ref 20–29)
Calcium: 9.6 mg/dL (ref 8.7–10.2)
Chloride: 90 mmol/L — ABNORMAL LOW (ref 96–106)
Creatinine, Ser: 1.04 mg/dL — ABNORMAL HIGH (ref 0.57–1.00)
GFR calc Af Amer: 69 mL/min/{1.73_m2} (ref >59)
GFR calc non Af Amer: 60 mL/min/{1.73_m2} (ref >59)
Glucose: 96 mg/dL (ref 65–99)
Potassium: 5.5 mmol/L — ABNORMAL HIGH (ref 3.5–5.2)
Sodium: 129 mmol/L — ABNORMAL LOW (ref 134–144)

## 2019-04-28 NOTE — Telephone Encounter (Signed)
Please let her know that her kidneys look good but her potassium is a little high. I want her to avoid citrus, bananas and salt-substitutes and come in for a recheck in 1 week. Order in.

## 2019-04-28 NOTE — Telephone Encounter (Signed)
Patient notified and verbalized understanding. States she will call back to schedule, has to look at her calendar.

## 2019-05-09 ENCOUNTER — Other Ambulatory Visit: Payer: Self-pay | Admitting: Family Medicine

## 2019-06-09 ENCOUNTER — Other Ambulatory Visit: Payer: Self-pay | Admitting: Family Medicine

## 2019-06-09 NOTE — Telephone Encounter (Signed)
Routing to provider  

## 2019-06-09 NOTE — Telephone Encounter (Signed)
Requested medication (s) are due for refill today: yes  Requested medication (s) are on the active medication list: yes  Last refill:  05/08/2019  Future visit scheduled: yes  Notes to clinic:  Refill cannot be delegated    Requested Prescriptions  Pending Prescriptions Disp Refills   cyclobenzaprine (FLEXERIL) 10 MG tablet [Pharmacy Med Name: CYCLOBENZAPRINE 10 MG TABLET] 30 tablet 2    Sig: TAKE 1 TABLET BY MOUTH EVERYDAY AT BEDTIME     Not Delegated - Analgesics:  Muscle Relaxants Failed - 06/09/2019  9:21 AM      Failed - This refill cannot be delegated      Passed - Valid encounter within last 6 months    Recent Outpatient Visits          1 month ago [place], Megan P, DO   3 months ago Essential hypertension   [place], Megan P, DO   4 months ago Essential hypertension   [place], Megan P, DO   4 months ago Moderate episode of recurrent major depressive disorder ([place])   [place], Megan P, DO   5 months ago Moderate episode of recurrent major depressive disorder [facility])   [place], Megan P, DO      Future Appointments            In 1 month Johnson, Barb Merino, DO [employer], [facility]

## 2019-06-22 ENCOUNTER — Telehealth: Payer: Self-pay

## 2019-06-30 ENCOUNTER — Other Ambulatory Visit: Payer: Self-pay | Admitting: Family Medicine

## 2019-06-30 NOTE — Telephone Encounter (Signed)
Requested medication (s) are due for refill today: yes  Requested medication (s) are on the active medication list: yes  Last refill: 05/31/2019  Future visit scheduled: no  Notes to clinic:  LOV: 04/27/2019 Refill cannot be delegated    Requested Prescriptions  Pending Prescriptions Disp Refills   LORazepam (ATIVAN) 0.5 MG tablet [Pharmacy Med Name: LORAZEPAM 0.5 MG TABLET] 30 tablet 1    Sig: TAKE 1 TABLET (0.5 MG TOTAL) BY MOUTH 2 (TWO) TIMES DAILY AS NEEDED FOR ANXIETY.*8/26     Not Delegated - Psychiatry:  Anxiolytics/Hypnotics Failed - 06/30/2019  3:07 PM      Failed - This refill cannot be delegated      Failed - Urine Drug Screen completed in last 360 days.      Passed - Valid encounter within last 6 months    Recent Outpatient Visits          2 months ago [place], [place], DO   4 months ago Essential hypertension   [place], [place], DO   5 months ago Essential hypertension   [place], Megan P, DO   5 months ago Moderate episode of recurrent major depressive disorder [facility])   [place], Megan P, DO   6 months ago Moderate episode of recurrent major depressive disorder [facility])   [place], Megan P, DO

## 2019-06-30 NOTE — Telephone Encounter (Signed)
Routing to provider  

## 2019-07-03 ENCOUNTER — Other Ambulatory Visit: Payer: Self-pay | Admitting: Family Medicine

## 2019-07-03 NOTE — Telephone Encounter (Signed)
Requested medication (s) are due for refill today: yes  Requested medication (s) are on the active medication list: yes  Last refill:  05/31/2019  Future visit scheduled: no  Notes to clinic: refill cannot be delegated    Requested Prescriptions  Pending Prescriptions Disp Refills   LORazepam (ATIVAN) 0.5 MG tablet [Pharmacy Med Name: LORAZEPAM 0.5 MG TABLET] 30 tablet 1    Sig: TAKE 1 TABLET (0.5 MG TOTAL) BY MOUTH 2 (TWO) TIMES DAILY AS NEEDED FOR ANXIETY.*8/26     Not Delegated - Psychiatry:  Anxiolytics/Hypnotics Failed - 07/03/2019 11:38 AM      Failed - This refill cannot be delegated      Failed - Urine Drug Screen completed in last 360 days.      Passed - Valid encounter within last 6 months    Recent Outpatient Visits          2 months ago [place], [place], DO   4 months ago Essential hypertension   [place], [place], DO   5 months ago Essential hypertension   [place], Megan P, DO   5 months ago Moderate episode of recurrent major depressive disorder [facility])   [place], Megan P, DO   6 months ago Moderate episode of recurrent major depressive disorder [facility])   [place], Megan P, DO

## 2019-07-03 NOTE — Telephone Encounter (Signed)
Pt states that she requested this medication from the pharmacy three days ago, currently out and will have to utilize other meds because her "nerves are shot" please advise. Pt is requesting call back to confirm when this is done

## 2019-07-10 ENCOUNTER — Other Ambulatory Visit: Payer: Self-pay | Admitting: Family Medicine

## 2019-07-10 ENCOUNTER — Other Ambulatory Visit: Payer: Self-pay

## 2019-07-10 ENCOUNTER — Ambulatory Visit (INDEPENDENT_AMBULATORY_CARE_PROVIDER_SITE_OTHER): Payer: Medicaid Other | Admitting: Family Medicine

## 2019-07-10 ENCOUNTER — Encounter: Payer: Self-pay | Admitting: Family Medicine

## 2019-07-10 VITALS — BP 109/88 | Wt 120.0 lb

## 2019-07-10 DIAGNOSIS — F419 Anxiety disorder, unspecified: Secondary | ICD-10-CM | POA: Diagnosis not present

## 2019-07-10 MED ORDER — LORAZEPAM 0.5 MG PO TABS: 0.5000 mg | ORAL_TABLET | Freq: Two times a day (BID) | ORAL | 0 refills | Status: DC | PRN

## 2019-07-10 MED ORDER — FLUOXETINE HCL 40 MG PO CAPS: 80.0000 mg | ORAL_CAPSULE | Freq: Every day | ORAL | 1 refills | Status: DC

## 2019-07-10 MED ORDER — LISINOPRIL 5 MG PO TABS: 10.0000 mg | ORAL_TABLET | Freq: Every day | ORAL | 1 refills | Status: DC

## 2019-07-10 MED ORDER — QUETIAPINE FUMARATE 25 MG PO TABS: 25.0000 mg | ORAL_TABLET | Freq: Every day | ORAL | 2 refills | Status: DC

## 2019-07-10 NOTE — Progress Notes (Signed)
BP 109/88   Wt 120 lb (54.4 kg)   LMP 06/17/2011   BMI 20.60 kg/m    Subjective:    Patient ID: Jill Poole, female    DOB: Jun 10, 1961, 58 y.o.   MRN: [id number]  HPI: Jill Poole is a 58 y.o. female  Chief Complaint  Patient presents with  . Anxiety   ANXIETY/STRESS- has been homeless and lost her mom, she has been a mess. Has not been feeling well. Feels like she wants to take her lorazepam 2x a day. She notes that the hydroxyzine has not been helping Duration: Chronic Status:exacerbated Anxious mood: yes  Excessive worrying: yes Irritability: yes  Sweating: no Nausea: no Palpitations:no Hyperventilation: no Panic attacks: yes Agoraphobia: yes  Obscessions/compulsions: yes Depressed mood: yes Depression screen [facility] 2/9 07/10/2019 04/27/2019 01/27/2019 01/12/2019 12/23/2018  Decreased Interest 0 1 0 0 0  Down, Depressed, Hopeless 2 1 0 2 0  PHQ - 2 Score 2 2 0 2 0  Altered sleeping 3 1 1  0 0  Tired, decreased energy 0 1 3 0 0  Change in appetite 0 0 3 0 0  Feeling bad or failure about yourself  2 0 0 0 0  Trouble concentrating 0 1 1 0 0  Moving slowly or fidgety/restless 1 0 1 0 0  Suicidal thoughts 0 0 0 0 0  PHQ-9 Score 8 5 9 2  0  Difficult doing work/chores Somewhat difficult Somewhat difficult - Not difficult at all -  Some recent data might be hidden   GAD 7 : Generalized Anxiety Score 07/10/2019 04/27/2019 02/24/2019 01/27/2019  Nervous, Anxious, on Edge 3 1 1 1   Control/stop worrying 3 1 1  0  Worry too much - different things 3 2 0 0  Trouble relaxing 3 2 0 0  Restless 3 1 2  0  Easily annoyed or irritable 2 1 0 1  Afraid - awful might happen 3 1 0 0  Total GAD 7 Score 20 9 4 2   Anxiety Difficulty Somewhat difficult Somewhat difficult Somewhat difficult -   Anhedonia: no Weight changes: no Insomnia: yes   Hypersomnia: no Fatigue/loss of energy: yes Feelings of worthlessness: yes Feelings of guilt: yes Impaired concentration/indecisiveness: yes  Suicidal ideations: no  Crying spells: yes Recent Stressors/Life Changes: yes   Relationship problems: yes   Family stress: yes     Financial stress: yes    Job stress: no    Recent death/loss: yes   Relevant past medical, surgical, family and social history reviewed and updated as indicated. Interim medical history since our last visit reviewed. Allergies and medications reviewed and updated.  Review of Systems  Constitutional: Negative.   Respiratory: Negative.   Cardiovascular: Negative.   Musculoskeletal: Negative.   Skin: Negative.   Neurological: Negative.   Psychiatric/Behavioral: Positive for dysphoric mood and sleep disturbance. Negative for agitation, behavioral problems, confusion, decreased concentration, hallucinations, self-injury and suicidal ideas. The patient is nervous/anxious. The patient is not hyperactive.     Per HPI unless specifically indicated above     Objective:    BP 109/88   Wt 120 lb (54.4 kg)   LMP 06/17/2011   BMI 20.60 kg/m   Wt Readings from Last 3 Encounters:  07/10/19 120 lb (54.4 kg)  04/27/19 113 lb (51.3 kg)  02/24/19 109 lb (49.4 kg)    Physical Exam Vitals signs and nursing note reviewed.  Pulmonary:     Effort: Pulmonary effort is normal. No respiratory distress.  Comments: Speaking in full sentences Neurological:     Mental Status: She is alert.  Psychiatric:        Mood and Affect: Mood is anxious and depressed. Affect is tearful.        Behavior: Behavior normal.        Thought Content: Thought content normal.        Judgment: Judgment normal.     Results for orders placed or performed in visit on [id number]  Basic metabolic panel  Result Value Ref Range   Glucose 96 65 - 99 mg/dL   BUN 12 6 - 24 mg/dL   Creatinine, Ser 1.04 (H) 0.57 - 1.00 mg/dL   GFR calc non Af Amer 60 >59 mL/min/1.73   GFR calc Af Amer 69 >59 mL/min/1.73   BUN/Creatinine Ratio 12 9 - 23   Sodium 129 (L) 134 - 144 mmol/L   Potassium 5.5  (H) 3.5 - 5.2 mmol/L   Chloride 90 (L) 96 - 106 mmol/L   CO2 25 20 - 29 mmol/L   Calcium 9.6 8.7 - 10.2 mg/dL      Assessment & Plan:   Problem List Items Addressed This Visit      Other   Anxiety - Primary    Not under good control. Has been in acute exacerbation. Will refill her lorazepam, continue hydroxyzine and prozac and add seroquel at bedtime. Recheck 1 month. Call with any concerns.       Relevant Medications   LORazepam (ATIVAN) 0.5 MG tablet       Follow up plan: Return in about 4 weeks (around 08/07/2019).   . This visit was completed via telephone due to the restrictions of the COVID-19 pandemic. All issues as above were discussed and addressed but no physical exam was performed. If it was felt that the patient should be evaluated in the office, they were directed there. The patient verbally consented to this visit. Patient was unable to complete an audio/visual visit due to Lack of equipment. Due to the catastrophic nature of the COVID-19 pandemic, this visit was done through audio contact only. . Location of the patient: home . Location of the provider: home . Those involved with this call:  . Provider: Park Liter, DO . CMA: Yvonna Alanis, [place] . Front Desk/Registration: Don Perking  . Time spent on call: 15 minutes with patient face to face via video conference. More than 50% of this time was spent in counseling and coordination of care. 23 minutes total spent in review of patient's record and preparation of their chart.

## 2019-07-10 NOTE — Telephone Encounter (Signed)
Pt called and all her meds from today were sent to wrong pharmacy.  Pt needs sent to  [place], [place] (Phone) [phone] (Fax)   FLUoxetine (PROZAC) 40 MG capsule  lisinopril (ZESTRIL) 5 MG tablet

## 2019-07-10 NOTE — Assessment & Plan Note (Signed)
Not under good control. Has been in acute exacerbation. Will refill her lorazepam, continue hydroxyzine and prozac and add seroquel at bedtime. Recheck 1 month. Call with any concerns.

## 2019-07-13 ENCOUNTER — Telehealth: Payer: Self-pay

## 2019-07-13 NOTE — Telephone Encounter (Signed)
Called and spoke with pharmacy, zoloft and prozac were both sent in, they wanted to know which medication she is to take. Verbal from Dr. Wynetta Emery that she did not send in [place], pharmacy notified, they stated that they would look into it.  Copied from [place] [id number]. Topic: Quick Communication - Rx Refill/Question >> Jul 10, 2019  3:32 PM Erick Blinks wrote: Pharmacy called and has questions regarding refill please advise Jill Poole, [place] [address] Phone: [phone] Fax: [phone] >> Jul 10, 2019  4:55 PM Georgina Peer, [place] wrote: [facility] pharmacy, was on hold for 8 minutes. Will try to call again Monday.

## 2019-07-22 ENCOUNTER — Telehealth: Payer: Self-pay | Admitting: Family Medicine

## 2019-07-22 NOTE — Telephone Encounter (Signed)
Patient requested that her new pharmacy get scripts from previous pharmacy, but they told her that they can not do that.

## 2019-07-22 NOTE — Telephone Encounter (Signed)
°  Patient requesting call back from CMA to discuss medications that were by supposed to be transferred to  [place] [place] [facility] [place] (Phone) [phone] (Fax)    She states they have only received two medications, when she requested her entire medication list be sent to them instead of [facility].

## 2019-07-24 NOTE — Telephone Encounter (Signed)
Called [facility]. They received all of the patient's medications from [facility], verified patient's med list with the pharmacy. Patient just needs to call and let them know when she needs refills.   Called and left patient a VM letting her know this information.

## 2019-07-24 NOTE — Telephone Encounter (Signed)
Can we see if we can call and get Rxs transferred.

## 2019-07-28 ENCOUNTER — Ambulatory Visit: Payer: Medicaid Other | Admitting: Family Medicine

## 2019-08-12 ENCOUNTER — Encounter: Payer: Self-pay | Admitting: Family Medicine

## 2019-08-12 ENCOUNTER — Ambulatory Visit (INDEPENDENT_AMBULATORY_CARE_PROVIDER_SITE_OTHER): Payer: Medicaid Other | Admitting: Family Medicine

## 2019-08-12 ENCOUNTER — Telehealth: Payer: Self-pay | Admitting: Family Medicine

## 2019-08-12 ENCOUNTER — Other Ambulatory Visit: Payer: Self-pay

## 2019-08-12 VITALS — BP 144/84

## 2019-08-12 DIAGNOSIS — R300 Dysuria: Secondary | ICD-10-CM

## 2019-08-12 DIAGNOSIS — F419 Anxiety disorder, unspecified: Secondary | ICD-10-CM | POA: Diagnosis not present

## 2019-08-12 DIAGNOSIS — N898 Other specified noninflammatory disorders of vagina: Secondary | ICD-10-CM | POA: Diagnosis not present

## 2019-08-12 MED ORDER — LORAZEPAM 0.5 MG PO TABS: 0.5000 mg | ORAL_TABLET | Freq: Two times a day (BID) | ORAL | 0 refills | Status: AC | PRN

## 2019-08-12 NOTE — Telephone Encounter (Signed)
Copied from [place] [id number]. Topic: General - Inquiry >> Aug 12, 2019  9:06 AM Lennox Solders wrote: Reason for CRM: pt is calling to give dr Wynetta Emery the fax number to [facility] [phone]  and address [id number] [place] [address]. Pt will be going to [facility] today to get labs drawn .

## 2019-08-12 NOTE — Telephone Encounter (Signed)
Please fax urine in chart thanks.

## 2019-08-12 NOTE — Telephone Encounter (Signed)
Lab order printed and faxed as requested. Patient notified that this was done.

## 2019-08-12 NOTE — Progress Notes (Signed)
BP (!) 144/84   LMP 06/17/2011    Subjective:    Patient ID: Jill Poole, female    DOB: 1961/07/04, 58 y.o.   MRN: [id number]  HPI: Jill Poole is a 58 y.o. female  Chief Complaint  Patient presents with  . Urinary Tract Infection   URINARY SYMPTOMS Duration: 3 weeks Dysuria: no Urinary frequency: no Urgency: yes Small volume voids: no Symptom severity: mild Urinary incontinence: yes Foul odor: yes Hematuria: no Abdominal pain: no Back pain: no Suprapubic pain/pressure: no Flank pain: no Fever:  no Vomiting: no Relief with cranberry juice: no Relief with pyridium: no Status: stable Previous urinary tract infection: yes Recurrent urinary tract infection: no History of sexually transmitted disease: no Vaginal discharge: yes Treatments attempted: increasing fluids   VAGINAL DISCHARGE Duration: weeks Discharge description: white  Pruritus: yes Dysuria: no Malodorous: yes Urinary frequency: no Fevers: no Abdominal pain: no  History of sexually transmitted diseases: no Recent antibiotic use: no Context: stable  Treatments attempted: none  ANXIETY/STRESS Duration: Chronic Status:stable Anxious mood: yes  Excessive worrying: yes Irritability: no  Sweating: no Nausea: no Palpitations:no Hyperventilation: no Panic attacks: no Agoraphobia: yes  Obscessions/compulsions: no Depressed mood: yes Depression screen [facility] 2/9 08/12/2019 07/10/2019 04/27/2019 01/27/2019 01/12/2019  Decreased Interest 0 0 1 0 0  Down, Depressed, Hopeless 1 2 1  0 2  PHQ - 2 Score 1 2 2  0 2  Altered sleeping 3 3 1 1  0  Tired, decreased energy 0 0 1 3 0  Change in appetite 3 0 0 3 0  Feeling bad or failure about yourself  0 2 0 0 0  Trouble concentrating 0 0 1 1 0  Moving slowly or fidgety/restless 0 1 0 1 0  Suicidal thoughts 0 0 0 0 0  PHQ-9 Score 7 8 5 9 2   Difficult doing work/chores Not difficult at all Somewhat difficult Somewhat difficult - Not difficult at all  Some  recent data might be hidden   GAD 7 : Generalized Anxiety Score 08/12/2019 07/10/2019 04/27/2019 02/24/2019  Nervous, Anxious, on Edge 2 3 1 1   Control/stop worrying 3 3 1 1   Worry too much - different things 3 3 2  0  Trouble relaxing 3 3 2  0  Restless 2 3 1 2   Easily annoyed or irritable 0 2 1 0  Afraid - awful might happen 0 3 1 0  Total GAD 7 Score 13 20 9 4   Anxiety Difficulty Not difficult at all Somewhat difficult Somewhat difficult Somewhat difficult   Anhedonia: no Weight changes: no Insomnia: no   Hypersomnia: no Fatigue/loss of energy: yes Feelings of worthlessness: no Feelings of guilt: no Impaired concentration/indecisiveness: no Suicidal ideations: no  Crying spells: no Recent Stressors/Life Changes: yes   Relationship problems: yes   Family stress: yes     Financial stress: yes    Job stress: no    Recent death/loss: no  Relevant past medical, surgical, family and social history reviewed and updated as indicated. Interim medical history since our last visit reviewed. Allergies and medications reviewed and updated.  Review of Systems  Constitutional: Negative.   Respiratory: Negative.   Cardiovascular: Negative.   Musculoskeletal: Negative.   Neurological: Negative.   Psychiatric/Behavioral: Positive for dysphoric mood. Negative for agitation, behavioral problems, confusion, decreased concentration, hallucinations, self-injury, sleep disturbance and suicidal ideas. The patient is nervous/anxious. The patient is not hyperactive.     Per HPI unless specifically indicated above     Objective:  BP (!) 144/84   LMP 06/17/2011   Wt Readings from Last 3 Encounters:  07/10/19 120 lb (54.4 kg)  04/27/19 113 lb (51.3 kg)  02/24/19 109 lb (49.4 kg)    Physical Exam Vitals signs and nursing note reviewed.  Pulmonary:     Effort: Pulmonary effort is normal. No respiratory distress.     Comments: Speaking in full sentences Neurological:     Mental Status: She  is alert.  Psychiatric:        Mood and Affect: Mood normal.        Behavior: Behavior normal.        Thought Content: Thought content normal.        Judgment: Judgment normal.     Results for orders placed or performed in visit on [id number]  Basic metabolic panel  Result Value Ref Range   Glucose 96 65 - 99 mg/dL   BUN 12 6 - 24 mg/dL   Creatinine, Ser 1.04 (H) 0.57 - 1.00 mg/dL   GFR calc non Af Amer 60 >59 mL/min/1.73   GFR calc Af Amer 69 >59 mL/min/1.73   BUN/Creatinine Ratio 12 9 - 23   Sodium 129 (L) 134 - 144 mmol/L   Potassium 5.5 (H) 3.5 - 5.2 mmol/L   Chloride 90 (L) 96 - 106 mmol/L   CO2 25 20 - 29 mmol/L   Calcium 9.6 8.7 - 10.2 mg/dL      Assessment & Plan:   Problem List Items Addressed This Visit      Other   Anxiety    Not getting any better. Added seroquel last visit. Still needing to take lorazepam 2x a day- states that that has helped and keeping her stable. Given difficulty in getting her under better control, will refer to psychiatry. Await their input. Call with any concerns.        Other Visit Diagnoses    Dysuria    -  Primary   Will check urine, but sounds more like BV. Await results.    Relevant Orders   UA/M w/rflx Culture, Routine   Vaginal discharge       Likely BV. Will treat empirically. Call if not getting better.        Follow up plan: Return in about 4 weeks (around 09/09/2019).    . This visit was completed via telephone due to the restrictions of the COVID-19 pandemic. All issues as above were discussed and addressed but no physical exam was performed. If it was felt that the patient should be evaluated in the office, they were directed there. The patient verbally consented to this visit. Patient was unable to complete an audio/visual visit due to Lack of equipment. Due to the catastrophic nature of the COVID-19 pandemic, this visit was done through audio contact only. . Location of the patient: home . Location of the provider:  home . Those involved with this call:  . Provider: Park Liter, DO . CMA: Tiffany Reel, CMA . Front Desk/Registration: Don Perking  . Time spent on call: 25 minutes on the phone discussing health concerns. 40 minutes total spent in review of patient's record and preparation of their chart.

## 2019-08-12 NOTE — Assessment & Plan Note (Signed)
Not getting any better. Added seroquel last visit. Still needing to take lorazepam 2x a day- states that that has helped and keeping her stable. Given difficulty in getting her under better control, will refer to psychiatry. Await their input. Call with any concerns.

## 2019-08-17 ENCOUNTER — Telehealth: Payer: Self-pay | Admitting: Family Medicine

## 2019-08-17 MED ORDER — CIPROFLOXACIN HCL 500 MG PO TABS: 500.0000 mg | ORAL_TABLET | Freq: Two times a day (BID) | ORAL | 0 refills | Status: DC

## 2019-08-17 NOTE — Telephone Encounter (Signed)
Patient states she is feeling ok and was notified about abx.

## 2019-08-17 NOTE — Telephone Encounter (Signed)
Please check in on how she's feeling- I've sent in another abx for her UTI. Thanks.

## 2019-09-11 ENCOUNTER — Encounter: Payer: Medicaid Other | Admitting: Family Medicine

## 2019-09-30 DIAGNOSIS — H53023 Refractive amblyopia, bilateral: Secondary | ICD-10-CM | POA: Diagnosis not present

## 2019-09-30 DIAGNOSIS — H52223 Regular astigmatism, bilateral: Secondary | ICD-10-CM | POA: Diagnosis not present

## 2019-09-30 DIAGNOSIS — H5203 Hypermetropia, bilateral: Secondary | ICD-10-CM | POA: Diagnosis not present

## 2019-10-01 DIAGNOSIS — H5213 Myopia, bilateral: Secondary | ICD-10-CM | POA: Diagnosis not present

## 2019-10-02 ENCOUNTER — Ambulatory Visit (INDEPENDENT_AMBULATORY_CARE_PROVIDER_SITE_OTHER): Payer: Medicaid Other | Admitting: Family Medicine

## 2019-10-02 ENCOUNTER — Encounter: Payer: Self-pay | Admitting: Family Medicine

## 2019-10-02 DIAGNOSIS — F331 Major depressive disorder, recurrent, moderate: Secondary | ICD-10-CM | POA: Diagnosis not present

## 2019-10-02 DIAGNOSIS — K219 Gastro-esophageal reflux disease without esophagitis: Secondary | ICD-10-CM

## 2019-10-02 DIAGNOSIS — I10 Essential (primary) hypertension: Secondary | ICD-10-CM | POA: Diagnosis not present

## 2019-10-02 DIAGNOSIS — J449 Chronic obstructive pulmonary disease, unspecified: Secondary | ICD-10-CM | POA: Diagnosis not present

## 2019-10-02 MED ORDER — ONDANSETRON 8 MG PO TBDP: 8.0000 mg | ORAL_TABLET | Freq: Three times a day (TID) | ORAL | 0 refills | Status: DC | PRN

## 2019-10-02 MED ORDER — FLUOXETINE HCL 40 MG PO CAPS: 80.0000 mg | ORAL_CAPSULE | Freq: Every day | ORAL | 1 refills | Status: DC

## 2019-10-02 MED ORDER — HYDROXYZINE HCL 25 MG PO TABS: 25.0000 mg | ORAL_TABLET | Freq: Three times a day (TID) | ORAL | 1 refills | Status: DC | PRN

## 2019-10-02 MED ORDER — OMEPRAZOLE 40 MG PO CPDR: DELAYED_RELEASE_CAPSULE | ORAL | 1 refills | Status: DC

## 2019-10-02 MED ORDER — FLUTICASONE-SALMETEROL 250-50 MCG/DOSE IN AEPB: 1.0000 | INHALATION_SPRAY | Freq: Two times a day (BID) | RESPIRATORY_TRACT | 3 refills | Status: DC

## 2019-10-02 MED ORDER — NAPROXEN 500 MG PO TABS: 500.0000 mg | ORAL_TABLET | Freq: Two times a day (BID) | ORAL | 1 refills | Status: DC

## 2019-10-02 MED ORDER — TRAZODONE HCL 100 MG PO TABS: 100.0000 mg | ORAL_TABLET | Freq: Every day | ORAL | 1 refills | Status: DC

## 2019-10-02 MED ORDER — GABAPENTIN 300 MG PO CAPS: 300.0000 mg | ORAL_CAPSULE | Freq: Three times a day (TID) | ORAL | 1 refills | Status: DC

## 2019-10-02 MED ORDER — QUETIAPINE FUMARATE 25 MG PO TABS: 25.0000 mg | ORAL_TABLET | Freq: Every day | ORAL | 2 refills | Status: DC

## 2019-10-02 MED ORDER — LORAZEPAM 0.5 MG PO TABS: 0.5000 mg | ORAL_TABLET | Freq: Two times a day (BID) | ORAL | 0 refills | Status: DC | PRN

## 2019-10-02 MED ORDER — LISINOPRIL 10 MG PO TABS: 15.0000 mg | ORAL_TABLET | Freq: Every day | ORAL | 1 refills | Status: DC

## 2019-10-02 NOTE — Progress Notes (Signed)
BP (!) 154/89   Pulse 76   Temp 98.7 F (37.1 C)   Wt 116 lb 4 oz (52.7 kg)   LMP 06/17/2011   BMI 19.95 kg/m    Subjective:    Patient ID: Jill Poole, female    DOB: August 24, 1961, 59 y.o.   MRN: [id number]  HPI: Jill Poole is a 59 y.o. female  Chief Complaint  Patient presents with  . Depression   ANXIETY/STRESS Duration:stable Anxious mood: yes  Excessive worrying: yes Irritability: no  Sweating: no Nausea: no Palpitations:no Hyperventilation: no Panic attacks: no Agoraphobia: no  Obscessions/compulsions: no Depressed mood: yes Depression screen [facility] 2/9 08/12/2019 07/10/2019 04/27/2019 01/27/2019 01/12/2019  Decreased Interest 0 0 1 0 0  Down, Depressed, Hopeless 1 2 1  0 2  PHQ - 2 Score 1 2 2  0 2  Altered sleeping 3 3 1 1  0  Tired, decreased energy 0 0 1 3 0  Change in appetite 3 0 0 3 0  Feeling bad or failure about yourself  0 2 0 0 0  Trouble concentrating 0 0 1 1 0  Moving slowly or fidgety/restless 0 1 0 1 0  Suicidal thoughts 0 0 0 0 0  PHQ-9 Score 7 8 5 9 2   Difficult doing work/chores Not difficult at all Somewhat difficult Somewhat difficult - Not difficult at all  Some recent data might be hidden   Anhedonia: no Weight changes: no Insomnia: yes   Hypersomnia: no Fatigue/loss of energy: yes Feelings of worthlessness: no Feelings of guilt: no Impaired concentration/indecisiveness: no Suicidal ideations: no  Crying spells: yes Recent Stressors/Life Changes: yes   Relationship problems: no   Family stress: no     Financial stress: yes    Job stress: yes    Recent death/loss: no  HYPERTENSION- has been off all her medicines for about 3 weeks except her blood pressure medicine Hypertension status: stable  Satisfied with current treatment? no Duration of hypertension: chronic BP monitoring frequency:  not checking BP medication side effects:  no Medication compliance: fair compliance Previous BP meds: lisinopril Aspirin: yes Recurrent  headaches: no Visual changes: no Palpitations: no Dyspnea: no Chest pain: no Lower extremity edema: no Dizzy/lightheaded: no  Relevant past medical, surgical, family and social history reviewed and updated as indicated. Interim medical history since our last visit reviewed. Allergies and medications reviewed and updated.  Review of Systems  Constitutional: Negative.   Respiratory: Negative.   Cardiovascular: Negative.   Skin: Negative.   Psychiatric/Behavioral: Positive for dysphoric mood. Negative for agitation, behavioral problems, confusion, decreased concentration, hallucinations, self-injury, sleep disturbance and suicidal ideas. The patient is nervous/anxious. The patient is not hyperactive.     Per HPI unless specifically indicated above     Objective:    BP (!) 154/89   Pulse 76   Temp 98.7 F (37.1 C)   Wt 116 lb 4 oz (52.7 kg)   LMP 06/17/2011   BMI 19.95 kg/m   Wt Readings from Last 3 Encounters:  10/02/19 116 lb 4 oz (52.7 kg)  07/10/19 120 lb (54.4 kg)  04/27/19 113 lb (51.3 kg)    Physical Exam Vitals and nursing note reviewed.  Pulmonary:     Effort: Pulmonary effort is normal. No respiratory distress.     Comments: Speaking in full sentences Neurological:     Mental Status: She is alert.  Psychiatric:        Mood and Affect: Mood normal.  Behavior: Behavior normal.        Thought Content: Thought content normal.        Judgment: Judgment normal.     Results for orders placed or performed in visit on [id number]  Basic metabolic panel  Result Value Ref Range   Glucose 96 65 - 99 mg/dL   BUN 12 6 - 24 mg/dL   Creatinine, Ser 1.04 (H) 0.57 - 1.00 mg/dL   GFR calc non Af Amer 60 >59 mL/min/1.73   GFR calc Af Amer 69 >59 mL/min/1.73   BUN/Creatinine Ratio 12 9 - 23   Sodium 129 (L) 134 - 144 mmol/L   Potassium 5.5 (H) 3.5 - 5.2 mmol/L   Chloride 90 (L) 96 - 106 mmol/L   CO2 25 20 - 29 mmol/L   Calcium 9.6 8.7 - 10.2 mg/dL        Assessment & Plan:   Problem List Items Addressed This Visit      Cardiovascular and Mediastinum   HTN (hypertension)    Not under good control. Will increase her lisinopril to 15mg  and recheck 1 month. Call with any concerns.       Relevant Medications   lisinopril (ZESTRIL) 10 MG tablet     Respiratory   COPD (chronic obstructive pulmonary disease) ([place])    Under good control on current regimen. Continue current regimen. Continue to monitor. Call with any concerns. Refills given.       Relevant Medications   Fluticasone-Salmeterol (ADVAIR) 250-50 MCG/DOSE AEPB     Digestive   GERD (gastroesophageal reflux disease)    Under good control on current regimen. Continue current regimen. Continue to monitor. Call with any concerns. Refills given.        Relevant Medications   omeprazole (PRILOSEC) 40 MG capsule   ondansetron (ZOFRAN ODT) 8 MG disintegrating tablet     Other   Moderate episode of recurrent major depressive disorder ([place])    Still has not heard from psychiatry- we will check on that. Refills of medicine today. Will give 1 month of lorazepam. Recheck 1 month. Call with any concerns.       Relevant Medications   FLUoxetine (PROZAC) 40 MG capsule   hydrOXYzine (ATARAX/VISTARIL) 25 MG tablet   traZODone (DESYREL) 100 MG tablet   LORazepam (ATIVAN) 0.5 MG tablet       Follow up plan: Return in about 4 weeks (around 10/30/2019) for physical.    . This visit was completed via telephone due to the restrictions of the COVID-19 pandemic. All issues as above were discussed and addressed but no physical exam was performed. If it was felt that the patient should be evaluated in the office, they were directed there. The patient verbally consented to this visit. Patient was unable to complete an audio/visual visit due to Lack of equipment. Due to the catastrophic nature of the COVID-19 pandemic, this visit was done through audio contact only. . Location of the patient:  home . Location of the provider: work . Those involved with this call:  . Provider: Park Liter, DO . CMA: Tiffany Reel, CMA . Front Desk/Registration: Don Perking  . Time spent on call: 25 minutes on the phone discussing health concerns. 40 minutes total spent in review of patient's record and preparation of their chart.

## 2019-10-03 ENCOUNTER — Encounter: Payer: Self-pay | Admitting: Family Medicine

## 2019-10-03 NOTE — Assessment & Plan Note (Signed)
Still has not heard from psychiatry- we will check on that. Refills of medicine today. Will give 1 month of lorazepam. Recheck 1 month. Call with any concerns.

## 2019-10-03 NOTE — Assessment & Plan Note (Signed)
Under good control on current regimen. Continue current regimen. Continue to monitor. Call with any concerns. Refills given.   

## 2019-10-03 NOTE — Assessment & Plan Note (Signed)
Not under good control. Will increase her lisinopril to 15mg  and recheck 1 month. Call with any concerns.

## 2019-10-05 ENCOUNTER — Encounter: Payer: Self-pay | Admitting: Family Medicine

## 2019-10-05 NOTE — Progress Notes (Signed)
Lvm to call us back  (DPR said not to leave details on why calling),sent letter.

## 2019-10-07 NOTE — Progress Notes (Signed)
Lvm to call us back.

## 2019-10-16 NOTE — Progress Notes (Signed)
Lvm to call us back

## 2019-11-05 DIAGNOSIS — H52223 Regular astigmatism, bilateral: Secondary | ICD-10-CM | POA: Diagnosis not present

## 2019-11-05 DIAGNOSIS — H524 Presbyopia: Secondary | ICD-10-CM | POA: Diagnosis not present

## 2019-11-06 ENCOUNTER — Other Ambulatory Visit: Payer: Self-pay | Admitting: Family Medicine

## 2019-11-06 NOTE — Telephone Encounter (Signed)
Patient last seen 10/02/19 and has f/up 11/20/19

## 2019-11-06 NOTE — Telephone Encounter (Signed)
Requested medication (s) are due for refill today: yes  Requested medication (s) are on the active medication list: yes  Last refill:  10/02/19  Future visit scheduled: yes  Notes to clinic:  not delegated    Requested Prescriptions  Pending Prescriptions Disp Refills   LORazepam (ATIVAN) 0.5 MG tablet [Pharmacy Med Name: LORAZEPAM 0.5MG  TABLETS] 60 tablet     Sig: TAKE 1 TABLET(0.5 MG) BY MOUTH TWICE DAILY AS NEEDED FOR ANXIETY      Not Delegated - Psychiatry:  Anxiolytics/Hypnotics Failed - 11/06/2019 12:56 PM      Failed - This refill cannot be delegated      Failed - Urine Drug Screen completed in last 360 days.      Passed - Valid encounter within last 6 months    Recent Outpatient Visits           1 month ago Essential hypertension   [place], [place], DO   2 months ago [place], [place], DO   3 months ago [place], [place], DO   6 months ago [place], [place], DO   8 months ago Essential hypertension   [place], Barb Merino, DO       Future Appointments             In 2 weeks Wynetta Emery, Barb Merino, DO [employer]

## 2019-11-09 MED ORDER — LORAZEPAM 0.5 MG PO TABS: 0.5000 mg | ORAL_TABLET | Freq: Two times a day (BID) | ORAL | 0 refills | Status: DC | PRN

## 2019-11-09 NOTE — Telephone Encounter (Signed)
Called and spoke to patient. Message relayed. Patient stated that she has set up her follow up appointment for 3/19 because that was the next available for a physical. Pt stated that she has not heard anything from the psychiatry yet.

## 2019-11-09 NOTE — Telephone Encounter (Signed)
Patient called in requesting refill again. Pt states she does have a follow up soon and would like to have it refilled before then. Please advise.

## 2019-11-09 NOTE — Telephone Encounter (Signed)
Please check on psych referral. Patient states she has not heard anything and it was placed in the beginning of December.

## 2019-11-09 NOTE — Telephone Encounter (Addendum)
She has an appointment 3/19. She was supposed to follow up after a month, and appears to have made her appointment for 2 months. Has she heard from psychiatry yet?

## 2019-11-09 NOTE — Addendum Note (Signed)
Addended by: Valerie Roys on: 11/09/2019 05:16 PM   Modules accepted: Orders

## 2019-11-20 ENCOUNTER — Encounter: Payer: Self-pay | Admitting: Family Medicine

## 2019-11-20 ENCOUNTER — Other Ambulatory Visit: Payer: Self-pay

## 2019-11-20 ENCOUNTER — Other Ambulatory Visit (HOSPITAL_COMMUNITY)
Admission: RE | Admit: 2019-11-20 | Discharge: 2019-11-20 | Disposition: A | Discharge status: Home / Self Care | Payer: Medicaid Other | Source: Ambulatory Visit | Attending: Family Medicine | Admitting: Family Medicine

## 2019-11-20 ENCOUNTER — Ambulatory Visit (INDEPENDENT_AMBULATORY_CARE_PROVIDER_SITE_OTHER): Payer: Medicaid Other | Admitting: Family Medicine

## 2019-11-20 VITALS — BP 113/73 | HR 86 | Temp 98.3°F | Ht 62.25 in | Wt 121.4 lb

## 2019-11-20 DIAGNOSIS — Z1231 Encounter for screening mammogram for malignant neoplasm of breast: Secondary | ICD-10-CM

## 2019-11-20 DIAGNOSIS — Z0000 Encounter for general adult medical examination without abnormal findings: Secondary | ICD-10-CM | POA: Insufficient documentation

## 2019-11-20 DIAGNOSIS — Z1211 Encounter for screening for malignant neoplasm of colon: Secondary | ICD-10-CM | POA: Diagnosis not present

## 2019-11-20 DIAGNOSIS — Z124 Encounter for screening for malignant neoplasm of cervix: Secondary | ICD-10-CM | POA: Diagnosis not present

## 2019-11-20 DIAGNOSIS — R8281 Pyuria: Secondary | ICD-10-CM

## 2019-11-20 NOTE — Progress Notes (Signed)
BP 113/73 (BP Location: Left Arm, Patient Position: Sitting, Cuff Size: Normal)   Pulse 86   Temp 98.3 F (36.8 C) (Oral)   Ht 5' 2.25" (1.581 m)   Wt 121 lb 6.4 oz (55.1 kg)   LMP 06/17/2011   SpO2 97%   BMI 22.03 kg/m    Subjective:    Patient ID: Jill Poole, female    DOB: [birth year], 59 y.o.   MRN: [id number]  HPI: Jill Poole is a 59 y.o. female presenting on 11/20/2019 for comprehensive medical examination. Current medical complaints include:none  Menopausal Symptoms: no  Depression Screen done today and results listed below:  Depression screen [facility] 2/9 11/20/2019 08/12/2019 07/10/2019 04/27/2019 01/27/2019  Decreased Interest 0 0 0 1 0  Down, Depressed, Hopeless 1 1 2 1  0  PHQ - 2 Score 1 1 2 2  0  Altered sleeping 1 3 3 1 1   Tired, decreased energy 0 0 0 1 3  Change in appetite 0 3 0 0 3  Feeling bad or failure about yourself  1 0 2 0 0  Trouble concentrating 0 0 0 1 1  Moving slowly or fidgety/restless 0 0 1 0 1  Suicidal thoughts 0 0 0 0 0  PHQ-9 Score 3 7 8 5 9   Difficult doing work/chores Somewhat difficult Not difficult at all Somewhat difficult Somewhat difficult -  Some recent data might be hidden     Past Medical History:  Past Medical History:  Diagnosis Date  . Asthma   . Back pain   . Concussion   . COPD (chronic obstructive pulmonary disease) ([place])   . DDD (degenerative disc disease), lumbar   . Depression   . Post traumatic stress disorder (PTSD)     Surgical History:  Past Surgical History:  Procedure Laterality Date  . TIBIA FRACTURE SURGERY    . TUBAL LIGATION    . WRIST SURGERY      Medications:  Current Outpatient Medications on File Prior to Visit  Medication Sig  . acetaminophen (TYLENOL) 500 MG tablet Take 500-1,000 mg by mouth every 6 (six) hours as needed for mild pain, moderate pain or fever.  Marland Kitchen albuterol (PROAIR HFA) 108 (90 Base) MCG/ACT inhaler INHALE 2 PUFFS BY MOUTH EVERY 6 HOURS AS NEEDED FOR WHEEZE OR SHORTNESS OF  BREATH  . aspirin 81 MG tablet Take 81 mg by mouth daily.  . B Complex Vitamins (VITAMIN B COMPLEX PO) Take by mouth daily.  . cyclobenzaprine (FLEXERIL) 10 MG tablet TAKE 1 TABLET BY MOUTH EVERYDAY AT BEDTIME  . FLUoxetine (PROZAC) 40 MG capsule Take 2 capsules (80 mg total) by mouth daily.  . fluticasone (FLONASE) 50 MCG/ACT nasal spray Place 1 spray into both nostrils daily as needed for allergies.   . Fluticasone-Salmeterol (ADVAIR) 250-50 MCG/DOSE AEPB Inhale 1 puff into the lungs 2 (two) times daily.  Marland Kitchen gabapentin (NEURONTIN) 300 MG capsule Take 1 capsule (300 mg total) by mouth 3 (three) times daily.  . hydrOXYzine (ATARAX/VISTARIL) 25 MG tablet Take 1-2 tablets (25-50 mg total) by mouth every 8 (eight) hours as needed for anxiety.  Marland Kitchen lisinopril (ZESTRIL) 10 MG tablet Take 1.5 tablets (15 mg total) by mouth daily.  Marland Kitchen LORazepam (ATIVAN) 0.5 MG tablet Take 1 tablet (0.5 mg total) by mouth 2 (two) times daily as needed for anxiety.  . naproxen (NAPROSYN) 500 MG tablet Take 1 tablet (500 mg total) by mouth 2 (two) times daily with a meal.  . omeprazole (PRILOSEC) 40 MG  capsule TAKE 1 CAPSULE BY MOUTH EVERY DAY  . ondansetron (ZOFRAN ODT) 8 MG disintegrating tablet Take 1 tablet (8 mg total) by mouth every 8 (eight) hours as needed.  Marland Kitchen QUEtiapine (SEROQUEL) 25 MG tablet Take 1 tablet (25 mg total) by mouth at bedtime.  . traZODone (DESYREL) 100 MG tablet Take 1 tablet (100 mg total) by mouth at bedtime.   No current facility-administered medications on file prior to visit.    Allergies:  Allergies  Allergen Reactions  . Adenosine   . Keflex [Cephalexin] Nausea And Vomiting  . Tramadol Nausea And Vomiting  . Codeine Nausea And Vomiting and Other (See Comments)    Hot flashes, throwing up sick.  . Hydrocodone-Acetaminophen Nausea Only    Social History:  Social History   Socioeconomic History  . Marital status: Single    Spouse name: Not on file  . Number of children: Not on file   . Years of education: Not on file  . Highest education level: Not on file  Occupational History  . Not on file  Tobacco Use  . Smoking status: Current Every Day Smoker    Packs/day: 1.00    Types: Cigarettes  . Smokeless tobacco: Never Used  Substance and Sexual Activity  . Alcohol use: Yes    Alcohol/week: 6.0 standard drinks    Types: 6 Glasses of wine per week    Comment: once every two weeks  . Drug use: No  . Sexual activity: Never    Birth control/protection: Surgical  Other Topics Concern  . Not on file  Social History Narrative  . Not on file   Social Determinants of Health   Financial Resource Strain:   . Difficulty of Paying Living Expenses:   Food Insecurity:   . Worried About Charity fundraiser in the Last Year:   . Arboriculturist in the Last Year:   Transportation Needs:   . Film/video editor (Medical):   Marland Kitchen Lack of Transportation (Non-Medical):   Physical Activity:   . Days of Exercise per Week:   . Minutes of Exercise per Session:   Stress:   . Feeling of Stress :   Social Connections:   . Frequency of Communication with Friends and Family:   . Frequency of Social Gatherings with Friends and Family:   . Attends Religious Services:   . Active Member of Clubs or Organizations:   . Attends Archivist Meetings:   Marland Kitchen Marital Status:   Intimate Partner Violence:   . Fear of Current or Ex-Partner:   . Emotionally Abused:   Marland Kitchen Physically Abused:   . Sexually Abused:    Social History   Tobacco Use  Smoking Status Current Every Day Smoker  . Packs/day: 1.00  . Types: Cigarettes  Smokeless Tobacco Never Used   Social History   Substance and Sexual Activity  Alcohol Use Yes  . Alcohol/week: 6.0 standard drinks  . Types: 6 Glasses of wine per week   Comment: once every two weeks    Family History:  Family History  Problem Relation Age of Onset  . Arthritis Mother   . Hypertension Mother   . Hypertension Sister   . Diabetes  Sister   . Cancer Sister   . Heart disease Brother   . Asthma Brother   . Other Son        Lipoma  . Cancer Maternal Grandmother        Stomach  . Breast cancer  Maternal Aunt 50    Past medical history, surgical history, medications, allergies, family history and social history reviewed with patient today and changes made to appropriate areas of the chart.   Review of Systems  Constitutional: Negative.   HENT: Negative.   Eyes: Negative.   Respiratory: Positive for cough, shortness of breath and wheezing. Negative for hemoptysis and sputum production.   Cardiovascular: Positive for palpitations. Negative for chest pain, orthopnea, claudication, leg swelling and PND.  Gastrointestinal: Negative.   Genitourinary: Negative.        Suprapubic pressure  Musculoskeletal: Negative.   Skin: Negative.   Neurological: Negative.   Endo/Heme/Allergies: Positive for environmental allergies. Negative for polydipsia. Does not bruise/bleed easily.  Psychiatric/Behavioral: Positive for depression. Negative for hallucinations, memory loss, substance abuse and suicidal ideas. The patient is nervous/anxious. The patient does not have insomnia.     All other ROS negative except what is listed above and in the HPI.      Objective:    BP 113/73 (BP Location: Left Arm, Patient Position: Sitting, Cuff Size: Normal)   Pulse 86   Temp 98.3 F (36.8 C) (Oral)   Ht 5' 2.25" (1.581 m)   Wt 121 lb 6.4 oz (55.1 kg)   LMP 06/17/2011   SpO2 97%   BMI 22.03 kg/m   Wt Readings from Last 3 Encounters:  11/20/19 121 lb 6.4 oz (55.1 kg)  10/02/19 116 lb 4 oz (52.7 kg)  07/10/19 120 lb (54.4 kg)    Physical Exam Vitals and nursing note reviewed. Exam conducted with a chaperone present.  Constitutional:      General: She is not in acute distress.    Appearance: Normal appearance. She is not ill-appearing, toxic-appearing or diaphoretic.  HENT:     Head: Normocephalic and atraumatic.     Right Ear:  Tympanic membrane, ear canal and external ear normal. There is no impacted cerumen.     Left Ear: Tympanic membrane, ear canal and external ear normal. There is no impacted cerumen.     Nose: Nose normal. No congestion or rhinorrhea.     Mouth/Throat:     Mouth: Mucous membranes are moist.     Pharynx: Oropharynx is clear. No oropharyngeal exudate or posterior oropharyngeal erythema.  Eyes:     General: No scleral icterus.       Right eye: No discharge.        Left eye: No discharge.     Extraocular Movements: Extraocular movements intact.     Conjunctiva/sclera: Conjunctivae normal.     Pupils: Pupils are equal, round, and reactive to light.  Neck:     Vascular: No carotid bruit.  Cardiovascular:     Rate and Rhythm: Normal rate and regular rhythm.     Pulses: Normal pulses.     Heart sounds: No murmur. No friction rub. No gallop.   Pulmonary:     Effort: Pulmonary effort is normal. No respiratory distress.     Breath sounds: Normal breath sounds. No stridor. No wheezing, rhonchi or rales.  Chest:     Chest wall: No tenderness.     Breasts:        Right: Normal. No swelling, bleeding, inverted nipple, mass, nipple discharge, skin change or tenderness.        Left: Normal. No swelling, bleeding, inverted nipple, mass, nipple discharge, skin change or tenderness.  Abdominal:     General: Abdomen is flat. Bowel sounds are normal. There is no distension.  Palpations: Abdomen is soft. There is no mass.     Tenderness: There is no abdominal tenderness. There is no right CVA tenderness, left CVA tenderness, guarding or rebound.     Hernia: No hernia is present.  Genitourinary:    Labia:        Right: No rash, tenderness, lesion or injury.        Left: No rash, tenderness, lesion or injury.      Urethra: No prolapse, urethral pain, urethral swelling or urethral lesion.     Vagina: Normal. No signs of injury and foreign body. No vaginal discharge, erythema, tenderness, bleeding,  lesions or prolapsed vaginal walls.     Cervix: Friability and erythema present. No cervical motion tenderness, discharge, lesion, cervical bleeding or eversion.     Uterus: Normal.   Musculoskeletal:        General: No swelling, tenderness, deformity or signs of injury.     Cervical back: Normal range of motion and neck supple. No rigidity. No muscular tenderness.     Right lower leg: No edema.     Left lower leg: No edema.  Lymphadenopathy:     Cervical: No cervical adenopathy.     Upper Body:     Right upper body: No supraclavicular, axillary or pectoral adenopathy.     Left upper body: No supraclavicular, axillary or pectoral adenopathy.  Skin:    General: Skin is warm and dry.     Capillary Refill: Capillary refill takes less than 2 seconds.     Coloration: Skin is not jaundiced or pale.     Findings: No bruising, erythema, lesion or rash.  Neurological:     General: No focal deficit present.     Mental Status: She is alert and oriented to person, place, and time. Mental status is at baseline.     Cranial Nerves: No cranial nerve deficit.     Sensory: No sensory deficit.     Motor: No weakness.     Coordination: Coordination normal.     Gait: Gait normal.     Deep Tendon Reflexes: Reflexes normal.  Psychiatric:        Mood and Affect: Mood normal.        Behavior: Behavior normal.        Thought Content: Thought content normal.        Judgment: Judgment normal.     Results for orders placed or performed in visit on [id number]  Basic metabolic panel  Result Value Ref Range   Glucose 96 65 - 99 mg/dL   BUN 12 6 - 24 mg/dL   Creatinine, Ser 1.04 (H) 0.57 - 1.00 mg/dL   GFR calc non Af Amer 60 >59 mL/min/1.73   GFR calc Af Amer 69 >59 mL/min/1.73   BUN/Creatinine Ratio 12 9 - 23   Sodium 129 (L) 134 - 144 mmol/L   Potassium 5.5 (H) 3.5 - 5.2 mmol/L   Chloride 90 (L) 96 - 106 mmol/L   CO2 25 20 - 29 mmol/L   Calcium 9.6 8.7 - 10.2 mg/dL      Assessment & Plan:    Problem List Items Addressed This Visit    None    Visit Diagnoses    Routine general medical examination at a health care facility    -  Primary   Vaccines up to date. Screening labs checked. Pap done. Mammogram & cologuard ordered. Continue diet and exercise. Call with any concerns. Continue to monitor.   Relevant Orders  CBC with Differential/Platelet   Comprehensive metabolic panel   Lipid Panel w/o Chol/HDL Ratio   TSH   UA/M w/rflx Culture, Routine   Screening for colon cancer       Cologuard ordered.    Relevant Orders   Cologuard   Encounter for screening mammogram for malignant neoplasm of breast       Mammogram ordered.    Relevant Orders   MM 3D SCREEN BREAST BILATERAL   Screening for cervical cancer       Pap done today.   Relevant Orders   Cytology - PAP       Follow up plan: Return for ASAP follow up.   LABORATORY TESTING:  - Pap smear: pap done  IMMUNIZATIONS:   - Tdap: Tetanus vaccination status reviewed: last tetanus booster within 10 years. - Influenza: Postponed to flu season - Pneumovax: Up to date  SCREENING: -Mammogram: Ordered today  - Colonoscopy: Ordered today   PATIENT COUNSELING:   Advised to take 1 mg of folate supplement per day if capable of pregnancy.   Sexuality: Discussed sexually transmitted diseases, partner selection, use of condoms, avoidance of unintended pregnancy  and contraceptive alternatives.   Advised to avoid cigarette smoking.  I discussed with the patient that most people either abstain from alcohol or drink within safe limits (<=14/week and <=4 drinks/occasion for males, <=7/weeks and <= 3 drinks/occasion for females) and that the risk for alcohol disorders and other health effects rises proportionally with the number of drinks per week and how often a drinker exceeds daily limits.  Discussed cessation/primary prevention of drug use and availability of treatment for abuse.   Diet: Encouraged to adjust caloric  intake to maintain  or achieve ideal body weight, to reduce intake of dietary saturated fat and total fat, to limit sodium intake by avoiding high sodium foods and not adding table salt, and to maintain adequate dietary potassium and calcium preferably from fresh fruits, vegetables, and low-fat dairy products.    stressed the importance of regular exercise  Injury prevention: Discussed safety belts, safety helmets, smoke detector, smoking near bedding or upholstery.   Dental health: Discussed importance of regular tooth brushing, flossing, and dental visits.    NEXT PREVENTATIVE PHYSICAL DUE IN 1 YEAR. Return for ASAP follow up.

## 2019-11-20 NOTE — Patient Instructions (Addendum)
Call to schedule your mammogram  [facility]  Address: [address], [place], [place] [address]  Phone: [phone]   Health Maintenance, Female Adopting a healthy lifestyle and getting preventive care are important in promoting health and wellness. Ask your health care provider about:  The right schedule for you to have regular tests and exams.  Things you can do on your own to prevent diseases and keep yourself healthy. What should I know about diet, weight, and exercise? Eat a healthy diet   Eat a diet that includes plenty of vegetables, fruits, low-fat dairy products, and lean protein.  Do not eat a lot of foods that are high in solid fats, added sugars, or sodium. Maintain a healthy weight Body mass index (BMI) is used to identify weight problems. It estimates body fat based on height and weight. Your health care provider can help determine your BMI and help you achieve or maintain a healthy weight. Get regular exercise Get regular exercise. This is one of the most important things you can do for your health. Most adults should:  Exercise for at least 150 minutes each week. The exercise should increase your heart rate and make you sweat (moderate-intensity exercise).  Do strengthening exercises at least twice a week. This is in addition to the moderate-intensity exercise.  Spend less time sitting. Even light physical activity can be beneficial. Watch cholesterol and blood lipids Have your blood tested for lipids and cholesterol at 57 years of age, then have this test every 5 years. Have your cholesterol levels checked more often if:  Your lipid or cholesterol levels are high.  You are older than 57 years of age.  You are at high risk for heart disease. What should I know about cancer screening? Depending on your health history and family history, you may need to have cancer screening at various ages. This may include screening  for:  Breast cancer.  Cervical cancer.  Colorectal cancer.  Skin cancer.  Lung cancer. What should I know about heart disease, diabetes, and high blood pressure? Blood pressure and heart disease  High blood pressure causes heart disease and increases the risk of stroke. This is more likely to develop in people who have high blood pressure readings, are of African descent, or are overweight.  Have your blood pressure checked: ? Every 3-5 years if you are 18-39 years of age. ? Every year if you are 40 years old or older. Diabetes Have regular diabetes screenings. This checks your fasting blood sugar level. Have the screening done:  Once every three years after age 40 if you are at a normal weight and have a low risk for diabetes.  More often and at a younger age if you are overweight or have a high risk for diabetes. What should I know about preventing infection? Hepatitis B If you have a higher risk for hepatitis B, you should be screened for this virus. Talk with your health care provider to find out if you are at risk for hepatitis B infection. Hepatitis C Testing is recommended for:  Everyone born from 1945 through 1965.  Anyone with known risk factors for hepatitis C. Sexually transmitted infections (STIs)  Get screened for STIs, including gonorrhea and chlamydia, if: ? You are sexually active and are younger than 57 years of age. ? You are older than 57 years of age and your health care provider tells you that you are at risk for this type of   infection. ? Your sexual activity has changed since you were last screened, and you are at increased risk for chlamydia or gonorrhea. Ask your health care provider if you are at risk.  Ask your health care provider about whether you are at high risk for HIV. Your health care provider may recommend a prescription medicine to help prevent HIV infection. If you choose to take medicine to prevent HIV, you should first get tested for HIV.  You should then be tested every 3 months for as long as you are taking the medicine. Pregnancy  If you are about to stop having your period (premenopausal) and you may become pregnant, seek counseling before you get pregnant.  Take 400 to 800 micrograms (mcg) of folic acid every day if you become pregnant.  Ask for birth control (contraception) if you want to prevent pregnancy. Osteoporosis and menopause Osteoporosis is a disease in which the bones lose minerals and strength with aging. This can result in bone fractures. If you are 65 years old or older, or if you are at risk for osteoporosis and fractures, ask your health care provider if you should:  Be screened for bone loss.  Take a calcium or vitamin D supplement to lower your risk of fractures.  Be given hormone replacement therapy (HRT) to treat symptoms of menopause. Follow these instructions at home: Lifestyle  Do not use any products that contain nicotine or tobacco, such as cigarettes, e-cigarettes, and chewing tobacco. If you need help quitting, ask your health care provider.  Do not use street drugs.  Do not share needles.  Ask your health care provider for help if you need support or information about quitting drugs. Alcohol use  Do not drink alcohol if: ? Your health care provider tells you not to drink. ? You are pregnant, may be pregnant, or are planning to become pregnant.  If you drink alcohol: ? Limit how much you use to 0-1 drink a day. ? Limit intake if you are breastfeeding.  Be aware of how much alcohol is in your drink. In the [place] one drink equals one 12 oz bottle of beer (355 mL), one 5 oz glass of wine (148 mL), or one 1 oz glass of hard liquor (44 mL). General instructions  Schedule regular health, dental, and eye exams.  Stay current with your vaccines.  Tell your health care provider if: ? You often feel depressed. ? You have ever been abused or do not feel safe at  home. Summary  Adopting a healthy lifestyle and getting preventive care are important in promoting health and wellness.  Follow your health care provider's instructions about healthy diet, exercising, and getting tested or screened for diseases.  Follow your health care provider's instructions on monitoring your cholesterol and blood pressure. This information is not intended to replace advice given to you by your health care provider. Make sure you discuss any questions you have with your health care provider. Document Revised: 08/13/2018 Document Reviewed: 08/13/2018 Elsevier Patient Education  2020 Elsevier Inc.  

## 2019-11-21 LAB — CBC WITH DIFFERENTIAL/PLATELET
Basophils Absolute: 0.1 10*3/uL (ref 0.0–0.2)
Basos: 1 %
EOS (ABSOLUTE): 0.1 10*3/uL (ref 0.0–0.4)
Eos: 1 %
Hematocrit: 35.6 % (ref 34.0–46.6)
Hemoglobin: 12.8 g/dL (ref 11.1–15.9)
Immature Grans (Abs): 0 10*3/uL (ref 0.0–0.1)
Immature Granulocytes: 1 %
Lymphocytes Absolute: 1.6 10*3/uL (ref 0.7–3.1)
Lymphs: 19 %
MCH: 36.1 pg — ABNORMAL HIGH (ref 26.6–33.0)
MCHC: 36 g/dL — ABNORMAL HIGH (ref 31.5–35.7)
MCV: 100 fL — ABNORMAL HIGH (ref 79–97)
Monocytes Absolute: 0.7 10*3/uL (ref 0.1–0.9)
Monocytes: 8 %
Neutrophils Absolute: 6 10*3/uL (ref 1.4–7.0)
Neutrophils: 70 %
Platelets: 197 10*3/uL (ref 150–450)
RBC: 3.55 x10E6/uL — ABNORMAL LOW (ref 3.77–5.28)
RDW: 13.2 % (ref 11.7–15.4)
WBC: 8.4 10*3/uL (ref 3.4–10.8)

## 2019-11-21 LAB — COMPREHENSIVE METABOLIC PANEL
ALT: 9 IU/L (ref 0–32)
AST: 20 IU/L (ref 0–40)
Albumin/Globulin Ratio: 1.6 (ref 1.2–2.2)
Albumin: 4.7 g/dL (ref 3.8–4.9)
Alkaline Phosphatase: 147 IU/L — ABNORMAL HIGH (ref 39–117)
BUN/Creatinine Ratio: 14 (ref 9–23)
BUN: 17 mg/dL (ref 6–24)
Bilirubin Total: 0.3 mg/dL (ref 0.0–1.2)
CO2: 19 mmol/L — ABNORMAL LOW (ref 20–29)
Calcium: 9.2 mg/dL (ref 8.7–10.2)
Chloride: 95 mmol/L — ABNORMAL LOW (ref 96–106)
Creatinine, Ser: 1.22 mg/dL — ABNORMAL HIGH (ref 0.57–1.00)
GFR calc Af Amer: 57 mL/min/{1.73_m2} — ABNORMAL LOW (ref >59)
GFR calc non Af Amer: 50 mL/min/{1.73_m2} — ABNORMAL LOW (ref >59)
Globulin, Total: 3 g/dL (ref 1.5–4.5)
Glucose: 88 mg/dL (ref 65–99)
Potassium: 4.9 mmol/L (ref 3.5–5.2)
Sodium: 132 mmol/L — ABNORMAL LOW (ref 134–144)
Total Protein: 7.7 g/dL (ref 6.0–8.5)

## 2019-11-21 LAB — LIPID PANEL W/O CHOL/HDL RATIO
Cholesterol, Total: 226 mg/dL — ABNORMAL HIGH (ref 100–199)
HDL: 133 mg/dL (ref >39)
LDL Chol Calc (NIH): 81 mg/dL (ref 0–99)
Triglycerides: 70 mg/dL (ref 0–149)
VLDL Cholesterol Cal: 12 mg/dL (ref 5–40)

## 2019-11-21 LAB — TSH: TSH: 1.63 u[IU]/mL (ref 0.450–4.500)

## 2019-11-23 ENCOUNTER — Other Ambulatory Visit: Payer: Self-pay | Admitting: Family Medicine

## 2019-11-23 DIAGNOSIS — N289 Disorder of kidney and ureter, unspecified: Secondary | ICD-10-CM

## 2019-11-23 LAB — MICROSCOPIC EXAMINATION: WBC, UA: >30 /hpf — AB (ref 0–5)

## 2019-11-23 LAB — URINE CULTURE, REFLEX

## 2019-11-23 LAB — UA/M W/RFLX CULTURE, ROUTINE
Bilirubin, UA: NEGATIVE
Glucose, UA: NEGATIVE
Ketones, UA: NEGATIVE
Nitrite, UA: NEGATIVE
Specific Gravity, UA: 1.015 (ref 1.005–1.030)
Urobilinogen, Ur: 0.2 mg/dL (ref 0.2–1.0)
pH, UA: 6.5 (ref 5.0–7.5)

## 2019-11-23 MED ORDER — AMOXICILLIN 500 MG PO CAPS: 500.0000 mg | ORAL_CAPSULE | Freq: Two times a day (BID) | ORAL | 0 refills | Status: DC

## 2019-11-25 ENCOUNTER — Telehealth: Payer: Self-pay | Admitting: Family Medicine

## 2019-11-25 LAB — CYTOLOGY - PAP
Comment: NEGATIVE
Diagnosis: NEGATIVE
High risk HPV: POSITIVE — AB

## 2019-11-25 MED ORDER — AMOXICILLIN 500 MG PO CAPS: 500.0000 mg | ORAL_CAPSULE | Freq: Two times a day (BID) | ORAL | 0 refills | Status: DC

## 2019-11-25 NOTE — Telephone Encounter (Signed)
Copied from [place] [id number]. Topic: General - Other >> Nov 25, 2019  2:48 PM Rainey Pines A wrote: Amoxicillin was sent to wrong pharmacy. Patient requesting callback once medication is sent to pharmacy. Patient wants medication sent to [place], [place] [address] [place]  Phone:  [phone] Fax:  [phone] >> Nov 25, 2019  4:01 PM Georgina Peer, CMA wrote: Dr. Wynetta Emery, I tried to just call the prescription into [facility] but was on hold for over 12 minutes. Can we please resend electronically? The [facility] is in the patient's chart.

## 2019-12-02 ENCOUNTER — Ambulatory Visit
Admission: RE | Admit: 2019-12-02 | Discharge: 2019-12-02 | Disposition: A | Discharge status: Home / Self Care | Payer: Medicaid Other | Source: Ambulatory Visit | Attending: Family Medicine | Admitting: Family Medicine

## 2019-12-02 DIAGNOSIS — Z1231 Encounter for screening mammogram for malignant neoplasm of breast: Secondary | ICD-10-CM | POA: Diagnosis not present

## 2019-12-08 ENCOUNTER — Encounter: Payer: Self-pay | Admitting: Family Medicine

## 2019-12-14 ENCOUNTER — Ambulatory Visit: Payer: Medicaid Other | Admitting: Family Medicine

## 2019-12-14 ENCOUNTER — Encounter: Payer: Self-pay | Admitting: Family Medicine

## 2019-12-14 ENCOUNTER — Other Ambulatory Visit: Payer: Self-pay

## 2019-12-14 ENCOUNTER — Other Ambulatory Visit: Payer: Self-pay | Admitting: Family Medicine

## 2019-12-14 VITALS — BP 130/80 | HR 81 | Temp 98.2°F | Ht 61.81 in | Wt 124.6 lb

## 2019-12-14 DIAGNOSIS — F331 Major depressive disorder, recurrent, moderate: Secondary | ICD-10-CM

## 2019-12-14 DIAGNOSIS — I10 Essential (primary) hypertension: Secondary | ICD-10-CM | POA: Diagnosis not present

## 2019-12-14 DIAGNOSIS — F419 Anxiety disorder, unspecified: Secondary | ICD-10-CM

## 2019-12-14 MED ORDER — ESCITALOPRAM OXALATE 5 MG PO TABS: 5.0000 mg | ORAL_TABLET | Freq: Every day | ORAL | 2 refills | Status: DC

## 2019-12-14 MED ORDER — QUETIAPINE FUMARATE 25 MG PO TABS: 25.0000 mg | ORAL_TABLET | Freq: Every day | ORAL | 1 refills | Status: DC

## 2019-12-14 MED ORDER — LORAZEPAM 0.5 MG PO TABS: 0.5000 mg | ORAL_TABLET | Freq: Two times a day (BID) | ORAL | 0 refills | Status: DC | PRN

## 2019-12-14 NOTE — Assessment & Plan Note (Signed)
Under good control on current regimen. Continue current regimen. Continue to monitor. Call with any concerns. Refills given. Labs drawn today.   

## 2019-12-14 NOTE — Assessment & Plan Note (Signed)
Has taken herself off her prozac. Has not heard from psychiatry. Will start lexapro and recheck 1 month. Continue abilify and trazodone. Refill of lorazepam given today. Will reach out to psychiatry to get appointment for her. Will continue lorazepam for now 1 month supply given. Recheck 1 month.

## 2019-12-14 NOTE — Telephone Encounter (Signed)
Requested medication (s) are due for refill today- yes  Requested medication (s) are on the active medication list -yes  Future visit scheduled -yes  Last refill: 11/09/19  Notes to clinic: Request for non delegated Rx  Requested Prescriptions  Pending Prescriptions Disp Refills   LORazepam (ATIVAN) 0.5 MG tablet 22 tablet 0    Sig: Take 1 tablet (0.5 mg total) by mouth 2 (two) times daily as needed for anxiety.      Not Delegated - Psychiatry:  Anxiolytics/Hypnotics Failed - 12/14/2019  1:36 PM      Failed - This refill cannot be delegated      Failed - Urine Drug Screen completed in last 360 days.      Passed - Valid encounter within last 6 months    Recent Outpatient Visits           3 weeks ago Routine general medical examination at a health care facility   [facility], [employer], DO   2 months ago Essential hypertension   [place], [place], DO   4 months ago [place], [place], DO   5 months ago [place], [place], DO   7 months ago [place], [place], DO       Future Appointments             Today Valerie Roys, DO [place], [facility]                Requested Prescriptions  Pending Prescriptions Disp Refills   LORazepam (ATIVAN) 0.5 MG tablet 22 tablet 0    Sig: Take 1 tablet (0.5 mg total) by mouth 2 (two) times daily as needed for anxiety.      Not Delegated - Psychiatry:  Anxiolytics/Hypnotics Failed - 12/14/2019  1:36 PM      Failed - This refill cannot be delegated      Failed - Urine Drug Screen completed in last 360 days.      Passed - Valid encounter within last 6 months    Recent Outpatient Visits           3 weeks ago Routine general medical examination at a health care facility   [facility], [place], DO   2 months ago Essential hypertension   [place], [place], DO   4 months ago [place], [place], DO   5 months ago [place], [place], DO   7 months ago [place], [place], DO       Future Appointments             Today Valerie Roys, [place], [facility]

## 2019-12-14 NOTE — Telephone Encounter (Signed)
Routing to provider  

## 2019-12-14 NOTE — Telephone Encounter (Signed)
Medication Refill - Medication: Lorazapam 0.5 mg  Has the patient contacted their pharmacy? No. (Agent: If no, request that the patient contact the pharmacy for the refill.) (Agent: If yes, when and what did the pharmacy advise?)  Preferred Pharmacy (with phone number or street name): [facility] [address]  [place]   Agent: Please be advised that RX refills may take up to 3 business days. We ask that you follow-up with your pharmacy.

## 2019-12-14 NOTE — Patient Instructions (Addendum)
Call to schedule your psychiatry appointment: [facility] [address], [place], [place] [address] Phone: [phone]

## 2019-12-14 NOTE — Progress Notes (Signed)
BP 130/80 (BP Location: Left Arm, Cuff Size: Normal)   Pulse 81   Temp 98.2 F (36.8 C) (Oral)   Ht 5' 1.81" (1.57 m)   Wt 124 lb 9.6 oz (56.5 kg)   LMP 06/17/2011   SpO2 93%   BMI 22.93 kg/m    Subjective:    Patient ID: Jill Poole, female    DOB: 01/05/1961, 59 y.o.   MRN: [id number]  HPI: Jill Poole is a 59 y.o. female  Chief Complaint  Patient presents with  . Hypertension  . Anxiety   HYPERTENSION Hypertension status: uncontrolled  Satisfied with current treatment? no Duration of hypertension: chronic BP monitoring frequency:  not checking BP medication side effects:  no Medication compliance: fair compliance Previous BP meds: lisinopril Aspirin: yes Recurrent headaches: no Visual changes: no Palpitations: no Dyspnea: no Chest pain: no Lower extremity edema: no Dizzy/lightheaded: no  ANXIETY/STRESS- stopped her prozac because it made her jittery.  Duration: Chronic Status:uncontrolled Anxious mood: yes  Excessive worrying: yes Irritability: yes  Sweating: no Nausea: no Palpitations:no Hyperventilation: no Panic attacks: yes Agoraphobia: yes  Obscessions/compulsions: no Depressed mood: yes Depression screen [facility] 2/9 12/14/2019 11/20/2019 08/12/2019 07/10/2019 04/27/2019  Decreased Interest 1 0 0 0 1  Down, Depressed, Hopeless 1 1 1 2 1   PHQ - 2 Score 2 1 1 2 2   Altered sleeping 3 1 3 3 1   Tired, decreased energy 1 0 0 0 1  Change in appetite 0 0 3 0 0  Feeling bad or failure about yourself  1 1 0 2 0  Trouble concentrating 1 0 0 0 1  Moving slowly or fidgety/restless 0 0 0 1 0  Suicidal thoughts 0 0 0 0 0  PHQ-9 Score 8 3 7 8 5   Difficult doing work/chores Somewhat difficult Somewhat difficult Not difficult at all Somewhat difficult Somewhat difficult  Some recent data might be hidden   GAD 7 : Generalized Anxiety Score 12/14/2019 08/12/2019 07/10/2019 04/27/2019  Nervous, Anxious, on Edge 1 2 3 1   Control/stop worrying 3 3 3 1   Worry too much  - different things 2 3 3 2   Trouble relaxing 3 3 3 2   Restless 3 2 3 1   Easily annoyed or irritable 0 0 2 1  Afraid - awful might happen 1 0 3 1  Total GAD 7 Score 13 13 20 9   Anxiety Difficulty Not difficult at all Not difficult at all Somewhat difficult Somewhat difficult   Anhedonia: no Weight changes: no Insomnia: yes hard to fall asleep  Hypersomnia: yes Fatigue/loss of energy: yes Feelings of worthlessness: yes Feelings of guilt: yes Impaired concentration/indecisiveness: yes Suicidal ideations: no  Crying spells: no Recent Stressors/Life Changes: yes  Relevant past medical, surgical, family and social history reviewed and updated as indicated. Interim medical history since our last visit reviewed. Allergies and medications reviewed and updated.  Review of Systems  Constitutional: Negative.   Respiratory: Negative.   Cardiovascular: Negative.   Gastrointestinal: Negative.   Musculoskeletal: Negative.   Skin: Negative.   Neurological: Negative.   Psychiatric/Behavioral: Positive for agitation, dysphoric mood and sleep disturbance. Negative for behavioral problems, confusion, decreased concentration, hallucinations, self-injury and suicidal ideas. The patient is nervous/anxious. The patient is not hyperactive.     Per HPI unless specifically indicated above     Objective:    BP 130/80 (BP Location: Left Arm, Cuff Size: Normal)   Pulse 81   Temp 98.2 F (36.8 C) (Oral)   Ht 5' 1.81" (  1.57 m)   Wt 124 lb 9.6 oz (56.5 kg)   LMP 06/17/2011   SpO2 93%   BMI 22.93 kg/m   Wt Readings from Last 3 Encounters:  12/14/19 124 lb 9.6 oz (56.5 kg)  11/20/19 121 lb 6.4 oz (55.1 kg)  10/02/19 116 lb 4 oz (52.7 kg)    Physical Exam Vitals and nursing note reviewed.  Constitutional:      General: She is not in acute distress.    Appearance: Normal appearance. She is not ill-appearing, toxic-appearing or diaphoretic.  HENT:     Head: Normocephalic and atraumatic.      Right Ear: External ear normal.     Left Ear: External ear normal.     Nose: Nose normal.     Mouth/Throat:     Mouth: Mucous membranes are moist.     Pharynx: Oropharynx is clear.  Eyes:     General: No scleral icterus.       Right eye: No discharge.        Left eye: No discharge.     Extraocular Movements: Extraocular movements intact.     Conjunctiva/sclera: Conjunctivae normal.     Pupils: Pupils are equal, round, and reactive to light.  Cardiovascular:     Rate and Rhythm: Normal rate and regular rhythm.     Pulses: Normal pulses.     Heart sounds: Normal heart sounds. No murmur. No friction rub. No gallop.   Pulmonary:     Effort: Pulmonary effort is normal. No respiratory distress.     Breath sounds: Normal breath sounds. No stridor. No wheezing, rhonchi or rales.  Chest:     Chest wall: No tenderness.  Musculoskeletal:        General: Normal range of motion.     Cervical back: Normal range of motion and neck supple.  Skin:    General: Skin is warm and dry.     Capillary Refill: Capillary refill takes less than 2 seconds.     Coloration: Skin is not jaundiced or pale.     Findings: No bruising, erythema, lesion or rash.  Neurological:     General: No focal deficit present.     Mental Status: She is alert and oriented to person, place, and time. Mental status is at baseline.  Psychiatric:        Mood and Affect: Mood normal.        Behavior: Behavior normal.        Thought Content: Thought content normal.        Judgment: Judgment normal.     Results for orders placed or performed in visit on 11/20/19  Microscopic Examination   URINE  Result Value Ref Range   WBC, UA >30 (A) 0 - 5 /hpf   RBC 3-10 (A) 0 - 2 /hpf   Epithelial Cells (non renal) 0-10 0 - 10 /hpf   Bacteria, UA Few (A) None seen/Few  Urine Culture, Reflex   URINE  Result Value Ref Range   Urine Culture, Routine Final report (A)    Organism ID, Bacteria Comment (A)   CBC with  Differential/Platelet  Result Value Ref Range   WBC 8.4 3.4 - 10.8 x10E3/uL   RBC 3.55 (L) 3.77 - 5.28 x10E6/uL   Hemoglobin 12.8 11.1 - 15.9 g/dL   Hematocrit 35.6 34.0 - 46.6 %   MCV 100 (H) 79 - 97 fL   MCH 36.1 (H) 26.6 - 33.0 pg   MCHC 36.0 (H) 31.5 -  35.7 g/dL   RDW 13.2 11.7 - 15.4 %   Platelets 197 150 - 450 x10E3/uL   Neutrophils 70 Not Estab. %   Lymphs 19 Not Estab. %   Monocytes 8 Not Estab. %   Eos 1 Not Estab. %   Basos 1 Not Estab. %   Neutrophils Absolute 6.0 1.4 - 7.0 x10E3/uL   Lymphocytes Absolute 1.6 0.7 - 3.1 x10E3/uL   Monocytes Absolute 0.7 0.1 - 0.9 x10E3/uL   EOS (ABSOLUTE) 0.1 0.0 - 0.4 x10E3/uL   Basophils Absolute 0.1 0.0 - 0.2 x10E3/uL   Immature Granulocytes 1 Not Estab. %   Immature Grans (Abs) 0.0 0.0 - 0.1 x10E3/uL  Comprehensive metabolic panel  Result Value Ref Range   Glucose 88 65 - 99 mg/dL   BUN 17 6 - 24 mg/dL   Creatinine, Ser 1.22 (H) 0.57 - 1.00 mg/dL   GFR calc non Af Amer 50 (L) >59 mL/min/1.73   GFR calc Af Amer 57 (L) >59 mL/min/1.73   BUN/Creatinine Ratio 14 9 - 23   Sodium 132 (L) 134 - 144 mmol/L   Potassium 4.9 3.5 - 5.2 mmol/L   Chloride 95 (L) 96 - 106 mmol/L   CO2 19 (L) 20 - 29 mmol/L   Calcium 9.2 8.7 - 10.2 mg/dL   Total Protein 7.7 6.0 - 8.5 g/dL   Albumin 4.7 3.8 - 4.9 g/dL   Globulin, Total 3.0 1.5 - 4.5 g/dL   Albumin/Globulin Ratio 1.6 1.2 - 2.2   Bilirubin Total 0.3 0.0 - 1.2 mg/dL   Alkaline Phosphatase 147 (H) 39 - 117 IU/L   AST 20 0 - 40 IU/L   ALT 9 0 - 32 IU/L  Lipid Panel w/o Chol/HDL Ratio  Result Value Ref Range   Cholesterol, Total 226 (H) 100 - 199 mg/dL   Triglycerides 70 0 - 149 mg/dL   HDL 133 >39 mg/dL   VLDL Cholesterol Cal 12 5 - 40 mg/dL   LDL Chol Calc (NIH) 81 0 - 99 mg/dL  TSH  Result Value Ref Range   TSH 1.630 0.450 - 4.500 uIU/mL  UA/M w/rflx Culture, Routine   Specimen: Urine   URINE  Result Value Ref Range   Specific Gravity, UA 1.015 1.005 - 1.030   pH, UA 6.5 5.0 - 7.5    Color, UA Yellow Yellow   Appearance Ur Cloudy (A) Clear   Leukocytes,UA 3+ (A) Negative   Protein,UA 1+ (A) Negative/Trace   Glucose, UA Negative Negative   Ketones, UA Negative Negative   RBC, UA 1+ (A) Negative   Bilirubin, UA Negative Negative   Urobilinogen, Ur 0.2 0.2 - 1.0 mg/dL   Nitrite, UA Negative Negative   Microscopic Examination See below:    Urinalysis Reflex Comment   Cytology - PAP  Result Value Ref Range   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   Diagnosis      - Negative for intraepithelial lesion or malignancy (NILM)   Comment Normal Reference Range HPV - Negative       Assessment & Plan:   Problem List Items Addressed This Visit      Cardiovascular and Mediastinum   HTN (hypertension) - Primary    Under good control on current regimen. Continue current regimen. Continue to monitor. Call with any concerns. Refills given. Labs drawn today.       Relevant Orders   Basic metabolic  panel     Other   Anxiety    Has taken herself off her prozac. Has not heard from psychiatry. Will start lexapro and recheck 1 month. Continue abilify and trazodone. Refill of lorazepam given today. Will reach out to psychiatry to get appointment for her. Will continue lorazepam for now 1 month supply given. Recheck 1 month.       Relevant Medications   escitalopram (LEXAPRO) 5 MG tablet   LORazepam (ATIVAN) 0.5 MG tablet   Moderate episode of recurrent major depressive disorder ([place])    Has taken herself off her prozac. Has not heard from psychiatry. Will start lexapro and recheck 1 month. Continue abilify and trazodone. Refill of lorazepam given today. Will reach out to psychiatry to get appointment for her. Will continue lorazepam for now 1 month supply given. Recheck 1 month.       Relevant Medications   escitalopram (LEXAPRO) 5 MG tablet   LORazepam (ATIVAN) 0.5 MG tablet       Follow up  plan: Return in about 4 weeks (around 01/11/2020).

## 2019-12-15 LAB — BASIC METABOLIC PANEL
BUN/Creatinine Ratio: 11 (ref 9–23)
BUN: 10 mg/dL (ref 6–24)
CO2: 17 mmol/L — ABNORMAL LOW (ref 20–29)
Calcium: 8.6 mg/dL — ABNORMAL LOW (ref 8.7–10.2)
Chloride: 85 mmol/L — ABNORMAL LOW (ref 96–106)
Creatinine, Ser: 0.94 mg/dL (ref 0.57–1.00)
GFR calc Af Amer: 78 mL/min/{1.73_m2} (ref >59)
GFR calc non Af Amer: 68 mL/min/{1.73_m2} (ref >59)
Glucose: 68 mg/dL (ref 65–99)
Potassium: 4.8 mmol/L (ref 3.5–5.2)
Sodium: 120 mmol/L — ABNORMAL LOW (ref 134–144)

## 2019-12-17 ENCOUNTER — Other Ambulatory Visit: Payer: Self-pay

## 2019-12-17 ENCOUNTER — Telehealth: Payer: Self-pay

## 2019-12-17 ENCOUNTER — Other Ambulatory Visit: Payer: Medicaid Other

## 2019-12-17 DIAGNOSIS — N289 Disorder of kidney and ureter, unspecified: Secondary | ICD-10-CM

## 2019-12-17 NOTE — Telephone Encounter (Signed)
Prior Authorization initiated via [employer] for Quetiapine 25mg  Confirmation #: [id number]  PA Approved

## 2019-12-18 LAB — BASIC METABOLIC PANEL
BUN/Creatinine Ratio: 9 (ref 9–23)
BUN: 10 mg/dL (ref 6–24)
CO2: 22 mmol/L (ref 20–29)
Calcium: 9.1 mg/dL (ref 8.7–10.2)
Chloride: 91 mmol/L — ABNORMAL LOW (ref 96–106)
Creatinine, Ser: 1.08 mg/dL — ABNORMAL HIGH (ref 0.57–1.00)
GFR calc Af Amer: 66 mL/min/{1.73_m2} (ref >59)
GFR calc non Af Amer: 58 mL/min/{1.73_m2} — ABNORMAL LOW (ref >59)
Glucose: 129 mg/dL — ABNORMAL HIGH (ref 65–99)
Potassium: 4.6 mmol/L (ref 3.5–5.2)
Sodium: 128 mmol/L — ABNORMAL LOW (ref 134–144)

## 2019-12-21 ENCOUNTER — Other Ambulatory Visit: Payer: Self-pay | Admitting: Family Medicine

## 2019-12-23 ENCOUNTER — Telehealth: Payer: Self-pay | Admitting: Family Medicine

## 2019-12-23 NOTE — Telephone Encounter (Signed)
Pt has not received rescue inhaler that was approved by [place] [employer] on [phone] at 6:18pm . Pt states pharmacy does not have medication . Please advise . albuterol (VENTOLIN HFA) 108 (90 Base) MCG/ACT inhaler [id number]    [facility] [phone] - [place], [place] [address]  [id number] [place] [address]  Phone: [phone] Fax: [phone]

## 2019-12-24 MED ORDER — ALBUTEROL SULFATE HFA 108 (90 BASE) MCG/ACT IN AERS: INHALATION_SPRAY | RESPIRATORY_TRACT | 2 refills | Status: DC

## 2019-12-24 NOTE — Telephone Encounter (Signed)
Dr. Wynetta Emery, I have tried to contact this pharmacy twice and been on hold over 10 minutes both times. Can we send in another prescription to see if it goes through please?

## 2020-01-08 DIAGNOSIS — Z1211 Encounter for screening for malignant neoplasm of colon: Secondary | ICD-10-CM | POA: Diagnosis not present

## 2020-01-08 DIAGNOSIS — Z1212 Encounter for screening for malignant neoplasm of rectum: Secondary | ICD-10-CM | POA: Diagnosis not present

## 2020-01-08 LAB — COLOGUARD: Cologuard: NEGATIVE

## 2020-01-12 ENCOUNTER — Encounter: Payer: Self-pay | Admitting: Family Medicine

## 2020-01-12 ENCOUNTER — Other Ambulatory Visit: Payer: Self-pay | Admitting: General Practice

## 2020-01-12 ENCOUNTER — Ambulatory Visit (INDEPENDENT_AMBULATORY_CARE_PROVIDER_SITE_OTHER): Payer: Medicaid Other | Admitting: Family Medicine

## 2020-01-12 VITALS — BP 155/111 | HR 84 | Temp 92.7°F | Wt 119.0 lb

## 2020-01-12 DIAGNOSIS — F331 Major depressive disorder, recurrent, moderate: Secondary | ICD-10-CM | POA: Diagnosis not present

## 2020-01-12 DIAGNOSIS — F419 Anxiety disorder, unspecified: Secondary | ICD-10-CM

## 2020-01-12 MED ORDER — LORAZEPAM 0.5 MG PO TABS: 0.5000 mg | ORAL_TABLET | Freq: Two times a day (BID) | ORAL | 0 refills | Status: DC | PRN

## 2020-01-12 MED ORDER — ESCITALOPRAM OXALATE 10 MG PO TABS: 10.0000 mg | ORAL_TABLET | Freq: Every day | ORAL | 3 refills | Status: DC

## 2020-01-12 NOTE — Assessment & Plan Note (Signed)
Doing better on the lexapro. Will increase her lexapro to 10mg  and recheck 1 month. Refill of lorazepam given today. Call with any concerns. Continue to monitor.

## 2020-01-12 NOTE — Telephone Encounter (Signed)
Needs appt

## 2020-01-12 NOTE — Telephone Encounter (Signed)
RX REFILL LORazepam (ATIVAN) 0.5 MG tablet  PHARMACY [facility] [phone] - [place], [place] AT [address] Phone:  [phone]  Fax:  [phone]

## 2020-01-12 NOTE — Telephone Encounter (Signed)
Routing to provider  

## 2020-01-12 NOTE — Telephone Encounter (Signed)
Appt scheduled

## 2020-01-12 NOTE — Progress Notes (Signed)
BP (!) 155/111   Pulse 84   Temp (!) 92.7 F (33.7 C)   Wt 119 lb (54 kg)   LMP 06/17/2011   BMI 21.90 kg/m    Subjective:    Patient ID: Jill Poole, female    DOB: [birth year], 59 y.o.   MRN: [id number]  HPI: Jill Poole is a 59 y.o. female  Chief Complaint  Patient presents with  . Anxiety    she has not called her Psych doctor.    ANXIETY/DEPRESSION- she is moving out of her home again. She likes the lexapro Duration: chronic Status:better Anxious mood: yes  Excessive worrying: yes Irritability: no  Sweating: no Nausea: no Palpitations:no Hyperventilation: no Panic attacks: no Agoraphobia: no  Obscessions/compulsions: no Depressed mood: yes Depression screen [facility] 2/9 01/12/2020 12/14/2019 11/20/2019 08/12/2019 07/10/2019  Decreased Interest 0 1 0 0 0  Down, Depressed, Hopeless 1 1 1 1 2   PHQ - 2 Score 1 2 1 1 2   Altered sleeping 3 3 1 3 3   Tired, decreased energy 0 1 0 0 0  Change in appetite 3 0 0 3 0  Feeling bad or failure about yourself  0 1 1 0 2  Trouble concentrating 0 1 0 0 0  Moving slowly or fidgety/restless 1 0 0 0 1  Suicidal thoughts 0 0 0 0 0  PHQ-9 Score 8 8 3 7 8   Difficult doing work/chores Not difficult at all Somewhat difficult Somewhat difficult Not difficult at all Somewhat difficult  Some recent data might be hidden   GAD 7 : Generalized Anxiety Score 01/12/2020 12/14/2019 08/12/2019 07/10/2019  Nervous, Anxious, on Edge 3 1 2 3   Control/stop worrying 1 3 3 3   Worry too much - different things 3 2 3 3   Trouble relaxing 3 3 3 3   Restless 2 3 2 3   Easily annoyed or irritable 2 0 0 2  Afraid - awful might happen 0 1 0 3  Total GAD 7 Score 14 13 13 20   Anxiety Difficulty Not difficult at all Not difficult at all Not difficult at all Somewhat difficult   Anhedonia: no Weight changes: no Insomnia: no   Hypersomnia: no Fatigue/loss of energy: no Feelings of worthlessness: no Feelings of guilt: yes Impaired concentration/indecisiveness:  no Suicidal ideations: no  Crying spells: no Recent Stressors/Life Changes: yes   Relationship problems: no   Family stress: no     Financial stress: yes    Job stress: no    Recent death/loss: no  Relevant past medical, surgical, family and social history reviewed and updated as indicated. Interim medical history since our last visit reviewed. Allergies and medications reviewed and updated.  Review of Systems  Constitutional: Negative.   Respiratory: Negative.   Cardiovascular: Negative.   Gastrointestinal: Negative.   Musculoskeletal: Negative.   Skin: Negative.   Psychiatric/Behavioral: Positive for dysphoric mood. Negative for agitation, behavioral problems, confusion, decreased concentration, hallucinations, self-injury, sleep disturbance and suicidal ideas. The patient is nervous/anxious. The patient is not hyperactive.     Per HPI unless specifically indicated above     Objective:    BP (!) 155/111   Pulse 84   Temp (!) 92.7 F (33.7 C)   Wt 119 lb (54 kg)   LMP 06/17/2011   BMI 21.90 kg/m   Wt Readings from Last 3 Encounters:  01/12/20 119 lb (54 kg)  12/14/19 124 lb 9.6 oz (56.5 kg)  11/20/19 121 lb 6.4 oz (55.1 kg)    Physical  Exam Vitals and nursing note reviewed.  Pulmonary:     Effort: Pulmonary effort is normal. No respiratory distress.     Comments: Speaking in full sentences Neurological:     Mental Status: She is alert.  Psychiatric:        Mood and Affect: Mood normal.        Behavior: Behavior normal.        Thought Content: Thought content normal.        Judgment: Judgment normal.     Results for orders placed or performed in visit on 123456  Basic metabolic panel  Result Value Ref Range   Glucose 129 (H) 65 - 99 mg/dL   BUN 10 6 - 24 mg/dL   Creatinine, Ser 1.08 (H) 0.57 - 1.00 mg/dL   GFR calc non Af Amer 58 (L) >59 mL/min/1.73   GFR calc Af Amer 66 >59 mL/min/1.73   BUN/Creatinine Ratio 9 9 - 23   Sodium 128 (L) 134 - 144  mmol/L   Potassium 4.6 3.5 - 5.2 mmol/L   Chloride 91 (L) 96 - 106 mmol/L   CO2 22 20 - 29 mmol/L   Calcium 9.1 8.7 - 10.2 mg/dL      Assessment & Plan:   Problem List Items Addressed This Visit      Other   Anxiety    Doing better on the lexapro. Will increase her lexapro to 10mg  and recheck 1 month. Refill of lorazepam given today. Call with any concerns. Continue to monitor.       Relevant Medications   escitalopram (LEXAPRO) 10 MG tablet   LORazepam (ATIVAN) 0.5 MG tablet   Moderate episode of recurrent major depressive disorder ([place]) - Primary    Doing better on the lexapro. Will increase her lexapro to 10mg  and recheck 1 month. Refill of lorazepam given today. Call with any concerns. Continue to monitor.       Relevant Medications   escitalopram (LEXAPRO) 10 MG tablet   LORazepam (ATIVAN) 0.5 MG tablet       Follow up plan: Return in about 4 weeks (around 02/09/2020).   . This visit was completed via telephone due to the restrictions of the COVID-19 pandemic. All issues as above were discussed and addressed but no physical exam was performed. If it was felt that the patient should be evaluated in the office, they were directed there. The patient verbally consented to this visit. Patient was unable to complete an audio/visual visit due to Lack of equipment. Due to the catastrophic nature of the COVID-19 pandemic, this visit was done through audio contact only. . Location of the patient: home . Location of the provider: work . Those involved with this call:  . Provider: Park Liter, DO . CMA: Lauretta Grill, RMA . Front Desk/Registration: Don Perking  . Time spent on call: 15 minutes on the phone discussing health concerns. 23 minutes total spent in review of patient's record and preparation of their chart.

## 2020-02-05 ENCOUNTER — Telehealth (INDEPENDENT_AMBULATORY_CARE_PROVIDER_SITE_OTHER): Payer: Medicaid Other | Admitting: Family Medicine

## 2020-02-05 ENCOUNTER — Ambulatory Visit: Payer: Self-pay

## 2020-02-05 ENCOUNTER — Encounter: Payer: Self-pay | Admitting: Family Medicine

## 2020-02-05 VITALS — BP 91/52 | Temp 97.3°F | Wt 118.0 lb

## 2020-02-05 DIAGNOSIS — R220 Localized swelling, mass and lump, head: Secondary | ICD-10-CM

## 2020-02-05 MED ORDER — PREDNISONE 10 MG PO TABS: ORAL_TABLET | ORAL | 0 refills | Status: DC

## 2020-02-05 NOTE — Telephone Encounter (Signed)
Reports she started having swelling to both eyes yesterday. No vision changes, no discharge, no fever. No shortness of breath or difficulty swallowing. States "the rest of me is fine." Has taken Benadryl without relief. Has not taken any new medicines or used new soap or shampoo. Requests virtual visit because she is helping a friend plan a funeral.Appointment made. Reason for Disposition . [1] SEVERE eyelid swelling (i.e., shut or almost) AND [2] involves both eyes AND [3] itchy  Answer Assessment - Initial Assessment Questions 1. ONSET: "When did the swelling start?" (e.g., minutes, hours, days)     Yesterday morning 2. LOCATION: "What part of the eyelids is swollen?"     Upper eyelid 3. SEVERITY: "How swollen is it?"     Moderate 4. ITCHING: "Is there any itching?" If so, ask: "How much?"   (Scale 1-10; mild, moderate or severe)     Eyebrows itching 5. PAIN: "Is the swelling painful to touch?" If so, ask: "How painful is it?"   (Scale 1-10; mild, moderate or severe)     Mild 6. FEVER: "Do you have a fever?" If so, ask: "What is it, how was it measured, and when did it start?"      No 7. CAUSE: "What do you think is causing the swelling?"     No 8. RECURRENT SYMPTOM: "Have you had eyelid swelling before?" If so, ask: "When was the last time?" "What happened that time?"     No 9. OTHER SYMPTOMS: "Do you have any other symptoms?" (e.g., blurred vision, eye discharge, rash, runny nose)     No 10. PREGNANCY: "Is there any chance you are pregnant?" "When was your last menstrual period?"       No  Protocols used: EYE - [facility]

## 2020-02-05 NOTE — Progress Notes (Signed)
BP (!) 91/52   Temp (!) 97.3 F (36.3 C) (Oral)   Wt 118 lb (53.5 kg)   LMP 06/17/2011   BMI 21.71 kg/m    Subjective:    Patient ID: Jill Poole, female    DOB: 1961/04/07, 59 y.o.   MRN: [id number]  HPI: Jill Poole is a 59 y.o. female  Chief Complaint  Patient presents with  . Edema    bilateral eyes since yesterday morning. if prescribing meds [facility] [place] [facility] time only  . Headache    . This visit was completed via MyChart due to the restrictions of the COVID-19 pandemic. All issues as above were discussed and addressed. Physical exam was done as above through visual confirmation on MyChart. If it was felt that the patient should be evaluated in the office, they were directed there. The patient verbally consented to this visit. . Location of the patient: home . Location of the provider: work . Those involved with this call:  . Provider: Merrie Roof, PA-C . CMA: Lesle Chris, [place] . Front Desk/Registration: Jill Side  . Time spent on call: 15 minutes with patient face to face via video conference. More than 50% of this time was spent in counseling and coordination of care. 5 minutes total spent in review of patient's record and preparation of their chart. I verified patient identity using two factors (patient name and date of birth). Patient consents verbally to being seen via telemedicine visit today.   Started yesterday with forehead and b/l eye swelling, mild redness, and burning pain. Friend who is with her states she found puncture wounds and swelling in anterior scalp and feels she may have gotten a spider bite. Denies visual changes, eye redness, eye pain, headaches, N/V. Took benadryl and used ice packs last night which did seem to help.   Relevant past medical, surgical, family and social history reviewed and updated as indicated. Interim medical history since our last visit reviewed. Allergies and medications reviewed and updated.  Review of  Systems  Per HPI unless specifically indicated above     Objective:    BP (!) 91/52   Temp (!) 97.3 F (36.3 C) (Oral)   Wt 118 lb (53.5 kg)   LMP 06/17/2011   BMI 21.71 kg/m   Wt Readings from Last 3 Encounters:  02/05/20 118 lb (53.5 kg)  01/12/20 119 lb (54 kg)  12/14/19 124 lb 9.6 oz (56.5 kg)    Physical Exam Vitals and nursing note reviewed.  Constitutional:      General: She is not in acute distress.    Appearance: Normal appearance.  HENT:     Head: Atraumatic.     Comments: Mild facial edema and erythema surrounding eyes and on forehead    Right Ear: External ear normal.     Left Ear: External ear normal.     Nose: Nose normal. No congestion.     Mouth/Throat:     Mouth: Mucous membranes are moist.     Pharynx: Oropharynx is clear. No posterior oropharyngeal erythema.  Eyes:     General:        Right eye: No discharge.        Left eye: No discharge.     Extraocular Movements: Extraocular movements intact.     Conjunctiva/sclera: Conjunctivae normal.  Cardiovascular:     Comments: Unable to assess via virtual visit Pulmonary:     Effort: Pulmonary effort is normal. No respiratory distress.  Musculoskeletal:        General: Normal range of motion.     Cervical back: Normal range of motion.  Skin:    General: Skin is dry.     Findings: Erythema present.  Neurological:     Mental Status: She is alert and oriented to person, place, and time.  Psychiatric:        Mood and Affect: Mood normal.        Thought Content: Thought content normal.        Judgment: Judgment normal.     Results for orders placed or performed in visit on 58/25/18  Basic metabolic panel  Result Value Ref Range   Glucose 129 (H) 65 - 99 mg/dL   BUN 10 6 - 24 mg/dL   Creatinine, Ser 1.08 (H) 0.57 - 1.00 mg/dL   GFR calc non Af Amer 58 (L) >59 mL/min/1.73   GFR calc Af Amer 66 >59 mL/min/1.73   BUN/Creatinine Ratio 9 9 - 23   Sodium 128 (L) 134 - 144 mmol/L   Potassium 4.6 3.5  - 5.2 mmol/L   Chloride 91 (L) 96 - 106 mmol/L   CO2 22 20 - 29 mmol/L   Calcium 9.1 8.7 - 10.2 mg/dL      Assessment & Plan:   Problem List Items Addressed This Visit    None    Visit Diagnoses    Facial swelling    -  Primary   Suspect allergic response to insect bite or other exposure. Prednisone taper, continued ice and antihistamines. Strict ER precautions given if worsening       Follow up plan: Return if symptoms worsen or fail to improve.

## 2020-02-12 ENCOUNTER — Ambulatory Visit: Payer: Medicaid Other | Admitting: Family Medicine

## 2020-03-03 ENCOUNTER — Telehealth: Payer: Self-pay | Admitting: Family Medicine

## 2020-03-03 MED ORDER — LISINOPRIL 10 MG PO TABS: 15.0000 mg | ORAL_TABLET | Freq: Every day | ORAL | 3 refills | Status: DC

## 2020-03-03 NOTE — Telephone Encounter (Signed)
Routing to provider  

## 2020-03-03 NOTE — Telephone Encounter (Signed)
Patient is calling again to check the status of her refill request.  She states that she already has an appt. Set for 07/12 and she is going out of town and is all out of her BP medication.  Please advise and fill prescription as soon as possible.

## 2020-03-03 NOTE — Telephone Encounter (Signed)
Spoke with patient.  Explained to patient per guidelines that we have to have and keep follow-up appointments for medications like Lorazepam because it is controlled. If she couldn't schedule sooner, she would have to wait until 03/14/2020. Patient understood and stated she had to cancel because there was a death in her family and said that she would like 1.5 tablets instead of 2 to help her last.  Patient then stated this was more in regards to her BP medication. She wanted to tell Dr. Wynetta Emery that it is not working. Patient was wondering if her BP medication should be changed or switched to a higher dose. Patient said "I'm going to just take 2 a day". I explained to patient that I could and will not tell her it was "okay" or to take 2 of her Lisinopril as I haven't been instructed by a provider. She should take her recommended dose of 1.5 tablets. Patient said "I know, but I have to do something."  Patient then told me her blood pressure was 218/118. I explained to patient multiple times she needed to be evaluated and to go to the ER, call 911 or at least an urgent care. Her BP reading was at a dangerous level. Patient refused and stated I have to go to [place].   Routing to provider.

## 2020-03-03 NOTE — Telephone Encounter (Signed)
Patient notified, appt scheduled.

## 2020-03-03 NOTE — Telephone Encounter (Signed)
Blood pressure medicine sent to her pharmacy. If she has been out of her medicine, I advise her to restart it, but to change her dose or to get refills on her lorazepam she will have to be seen. OK for virtual as long as she has a BP cuff

## 2020-03-03 NOTE — Addendum Note (Signed)
Addended by: Valerie Roys on: 03/03/2020 04:43 PM   Modules accepted: Orders

## 2020-03-03 NOTE — Telephone Encounter (Signed)
Requested medication (s) are due for refill today: yes  Requested medication (s) are on the active medication list:  yes  Last refill:  yes  Future visit scheduled:   Notes to clinic: this refill cannot be delegated    Requested Prescriptions  Pending Prescriptions Disp Refills   LORazepam (ATIVAN) 0.5 MG tablet [Pharmacy Med Name: LORAZEPAM 0.5MG  TABLETS] 60 tablet     Sig: TAKE 1 TABLET(0.5 MG) BY MOUTH TWICE DAILY AS NEEDED FOR ANXIETY      Not Delegated - Psychiatry:  Anxiolytics/Hypnotics Failed - 03/03/2020 12:32 PM      Failed - This refill cannot be delegated      Failed - Urine Drug Screen completed in last 360 days.      Passed - Valid encounter within last 6 months    Recent Outpatient Visits           3 weeks ago Facial swelling   [place], [place], [place]   1 month ago Moderate episode of recurrent major depressive disorder [facility])   [place], [place], DO   2 months ago Essential hypertension   [place], Megan P, DO   3 months ago Routine general medical examination at a health care facility   [facility], [place] P, DO   5 months ago Essential hypertension   [place], [place], DO

## 2020-03-03 NOTE — Telephone Encounter (Signed)
Pt is wanting to make sure that her medication is going to correct pharmacy      [facility] [phone] - [place], [place] AT [place]  [address] [place] [address]  Phone: [phone] Fax: [phone]

## 2020-03-04 ENCOUNTER — Telehealth (INDEPENDENT_AMBULATORY_CARE_PROVIDER_SITE_OTHER): Payer: Medicaid Other | Admitting: Family Medicine

## 2020-03-04 ENCOUNTER — Encounter: Payer: Self-pay | Admitting: Family Medicine

## 2020-03-04 VITALS — BP 169/112 | HR 95

## 2020-03-04 DIAGNOSIS — I10 Essential (primary) hypertension: Secondary | ICD-10-CM | POA: Diagnosis not present

## 2020-03-04 DIAGNOSIS — F331 Major depressive disorder, recurrent, moderate: Secondary | ICD-10-CM

## 2020-03-04 MED ORDER — ESCITALOPRAM OXALATE 20 MG PO TABS: 20.0000 mg | ORAL_TABLET | Freq: Every day | ORAL | 3 refills | Status: DC

## 2020-03-04 MED ORDER — LISINOPRIL 10 MG PO TABS: 15.0000 mg | ORAL_TABLET | Freq: Every day | ORAL | 3 refills | Status: DC

## 2020-03-04 MED ORDER — LORAZEPAM 0.5 MG PO TABS: 0.5000 mg | ORAL_TABLET | Freq: Two times a day (BID) | ORAL | 0 refills | Status: DC | PRN

## 2020-03-04 NOTE — Progress Notes (Signed)
BP (!) 169/112   Pulse 95   LMP 06/17/2011    Subjective:    Patient ID: Jill Poole, female    DOB: 07-19-1961, 59 y.o.   MRN: [id number]  HPI: Jill Poole is a 59 y.o. female  Chief Complaint  Patient presents with  . Anxiety  . Hypertension   HYPERTENSION- has been back on her blood pressure medicine about a week, but has only been taking 5mg .  Hypertension status: exacerbated  Satisfied with current treatment? no Duration of hypertension: chronic BP monitoring frequency:  occasionally BP range: 326Z-124P systolic BP medication side effects:  no Medication compliance: poor compliance Previous BP meds: lisinopril Aspirin: yes Recurrent headaches: yes Visual changes: no Palpitations: yes Dyspnea: no Chest pain: yes Lower extremity edema: no Dizzy/lightheaded: yes  ANXIETY/DEPRESSION- has been taking her lorazepam, 1x day, occasionally 2x a day Duration:better Anxious mood: yes  Excessive worrying: yes Irritability: no  Sweating: no Nausea: no Palpitations:no Hyperventilation: no Panic attacks: no Agoraphobia: no  Obscessions/compulsions: no Depressed mood: yes Depression screen [facility] 2/9 03/04/2020 01/12/2020 12/14/2019 11/20/2019 08/12/2019  Decreased Interest 0 0 1 0 0  Down, Depressed, Hopeless 0 1 1 1 1   PHQ - 2 Score 0 1 2 1 1   Altered sleeping 0 3 3 1 3   Tired, decreased energy 0 0 1 0 0  Change in appetite 0 3 0 0 3  Feeling bad or failure about yourself  0 0 1 1 0  Trouble concentrating 0 0 1 0 0  Moving slowly or fidgety/restless 3 1 0 0 0  Suicidal thoughts 0 0 0 0 0  PHQ-9 Score 3 8 8 3 7   Difficult doing work/chores Not difficult at all Not difficult at all Somewhat difficult Somewhat difficult Not difficult at all  Some recent data might be hidden   GAD 7 : Generalized Anxiety Score 03/04/2020 01/12/2020 12/14/2019 08/12/2019  Nervous, Anxious, on Edge 3 3 1 2   Control/stop worrying 0 1 3 3   Worry too much - different things 1 3 2 3   Trouble  relaxing 3 3 3 3   Restless 3 2 3 2   Easily annoyed or irritable 0 2 0 0  Afraid - awful might happen 0 0 1 0  Total GAD 7 Score 10 14 13 13   Anxiety Difficulty Not difficult at all Not difficult at all Not difficult at all Not difficult at all   Anhedonia: no Weight changes: no Insomnia: no   Hypersomnia: no Fatigue/loss of energy: yes Feelings of worthlessness: yes Feelings of guilt: yes Impaired concentration/indecisiveness: yes Suicidal ideations: no  Crying spells: no Recent Stressors/Life Changes: yes   Relationship problems: no   Family stress: yes     Financial stress: yes    Job stress: no    Recent death/loss: yes  Relevant past medical, surgical, family and social history reviewed and updated as indicated. Interim medical history since our last visit reviewed. Allergies and medications reviewed and updated.  Review of Systems  Constitutional: Negative.   HENT: Negative.   Respiratory: Negative.   Cardiovascular: Negative.   Musculoskeletal: Negative.   Skin: Negative.   Psychiatric/Behavioral: Positive for dysphoric mood. Negative for agitation, behavioral problems, confusion, decreased concentration, hallucinations, self-injury, sleep disturbance and suicidal ideas. The patient is nervous/anxious. The patient is not hyperactive.     Per HPI unless specifically indicated above     Objective:    BP (!) 169/112   Pulse 95   LMP 06/17/2011   Wt Readings  from Last 3 Encounters:  02/05/20 118 lb (53.5 kg)  01/12/20 119 lb (54 kg)  12/14/19 124 lb 9.6 oz (56.5 kg)    Physical Exam Vitals and nursing note reviewed.  Pulmonary:     Effort: Pulmonary effort is normal. No respiratory distress.     Comments: Speaking in full sentences Neurological:     Mental Status: She is alert.  Psychiatric:        Mood and Affect: Mood normal.        Behavior: Behavior normal.        Thought Content: Thought content normal.        Judgment: Judgment normal.      Results for orders placed or performed in visit on 02/11/20  Cologuard  Result Value Ref Range   Cologuard Negative Negative      Assessment & Plan:   Problem List Items Addressed This Visit      Cardiovascular and Mediastinum   HTN (hypertension) - Primary    Not under good control. Restart her 15mg  lisinopril. Recheck 1 month. Call with any concerns.       Relevant Medications   lisinopril (ZESTRIL) 10 MG tablet     Other   Moderate episode of recurrent major depressive disorder (HCC)    Better, but still not doing great. Will increase her lexapro to 20mg  and recheck 1 month. Refill of her lorazepam given. Should last 1-2 months based on use.      Relevant Medications   escitalopram (LEXAPRO) 20 MG tablet   LORazepam (ATIVAN) 0.5 MG tablet       Follow up plan: Return in about 4 weeks (around 04/01/2020).   . This visit was completed via telephone due to the restrictions of the COVID-19 pandemic. All issues as above were discussed and addressed but no physical exam was performed. If it was felt that the patient should be evaluated in the office, they were directed there. The patient verbally consented to this visit. Patient was unable to complete an audio/visual visit due to Lack of equipment. Due to the catastrophic nature of the COVID-19 pandemic, this visit was done through audio contact only. . Location of the patient: home . Location of the provider: work . Those involved with this call:  . Provider: Park Liter, DO . CMA: Tiffany Reel, CMA . Front Desk/Registration: Don Perking  . Time spent on call: 25 minutes on the phone discussing health concerns. 40 minutes total spent in review of patient's record and preparation of their chart.

## 2020-03-04 NOTE — Assessment & Plan Note (Signed)
Not under good control. Restart her 15mg  lisinopril. Recheck 1 month. Call with any concerns.

## 2020-03-04 NOTE — Assessment & Plan Note (Signed)
Better, but still not doing great. Will increase her lexapro to 20mg  and recheck 1 month. Refill of her lorazepam given. Should last 1-2 months based on use.

## 2020-03-08 ENCOUNTER — Telehealth: Payer: Self-pay | Admitting: Family Medicine

## 2020-03-08 NOTE — Telephone Encounter (Signed)
-----   Message from Valerie Roys, [place] sent at 03/04/2020 12:52 PM EDT ----- 4 weeks ok to cancel 7/12

## 2020-03-09 NOTE — Telephone Encounter (Signed)
Pt called stating that she is not actually going to be able to be back until 03/19/20. And is requesting to have the medication sent over later. Please advise.

## 2020-03-09 NOTE — Telephone Encounter (Signed)
Patient is calling stating she wants both medications sent over to the pharmacy.  Patient has been in [place].  Patient states the pharmacy will only hold medication on shelf for ten days.  Patient will not be back till next Monday.  Patient is requesting office to sent medication on Thursday Call back [phone]

## 2020-03-09 NOTE — Telephone Encounter (Signed)
Looks like Dr. Wynetta Emery sent these medications 03/04/20 to her local pharmacy.  She will have to let us know when she is back in town and will need to maintain appointments to ensure ongoing refills.  At this time refills were sent.

## 2020-03-14 ENCOUNTER — Ambulatory Visit: Payer: Medicaid Other | Admitting: Family Medicine

## 2020-04-14 ENCOUNTER — Ambulatory Visit (INDEPENDENT_AMBULATORY_CARE_PROVIDER_SITE_OTHER): Payer: Medicaid Other | Admitting: Family Medicine

## 2020-04-14 ENCOUNTER — Other Ambulatory Visit: Payer: Self-pay

## 2020-04-14 ENCOUNTER — Encounter: Payer: Self-pay | Admitting: Family Medicine

## 2020-04-14 VITALS — BP 149/89 | HR 77 | Temp 98.8°F | Wt 118.0 lb

## 2020-04-14 DIAGNOSIS — F419 Anxiety disorder, unspecified: Secondary | ICD-10-CM | POA: Diagnosis not present

## 2020-04-14 DIAGNOSIS — Z79899 Other long term (current) drug therapy: Secondary | ICD-10-CM

## 2020-04-14 DIAGNOSIS — Z13220 Encounter for screening for lipoid disorders: Secondary | ICD-10-CM

## 2020-04-14 DIAGNOSIS — I10 Essential (primary) hypertension: Secondary | ICD-10-CM | POA: Diagnosis not present

## 2020-04-14 DIAGNOSIS — F331 Major depressive disorder, recurrent, moderate: Secondary | ICD-10-CM

## 2020-04-14 MED ORDER — TRAZODONE HCL 100 MG PO TABS: 100.0000 mg | ORAL_TABLET | Freq: Every day | ORAL | 1 refills | Status: DC

## 2020-04-14 MED ORDER — OMEPRAZOLE 40 MG PO CPDR: DELAYED_RELEASE_CAPSULE | ORAL | 1 refills | Status: DC

## 2020-04-14 MED ORDER — ALBUTEROL SULFATE HFA 108 (90 BASE) MCG/ACT IN AERS: INHALATION_SPRAY | RESPIRATORY_TRACT | 2 refills | Status: DC

## 2020-04-14 MED ORDER — ESCITALOPRAM OXALATE 20 MG PO TABS: 20.0000 mg | ORAL_TABLET | Freq: Every day | ORAL | 3 refills | Status: DC

## 2020-04-14 MED ORDER — NAPROXEN 500 MG PO TABS: 500.0000 mg | ORAL_TABLET | Freq: Two times a day (BID) | ORAL | 1 refills | Status: DC

## 2020-04-14 MED ORDER — HYDROXYZINE HCL 25 MG PO TABS: 25.0000 mg | ORAL_TABLET | Freq: Three times a day (TID) | ORAL | 1 refills | Status: DC | PRN

## 2020-04-14 MED ORDER — LISINOPRIL 10 MG PO TABS: 15.0000 mg | ORAL_TABLET | Freq: Every day | ORAL | 3 refills | Status: DC

## 2020-04-14 MED ORDER — LORAZEPAM 0.5 MG PO TABS: 0.5000 mg | ORAL_TABLET | Freq: Two times a day (BID) | ORAL | 2 refills | Status: DC | PRN

## 2020-04-14 NOTE — Progress Notes (Signed)
BP (!) 149/89 (BP Location: Left Arm, Cuff Size: Normal)   Pulse 77   Temp 98.8 F (37.1 C) (Oral)   Wt 118 lb (53.5 kg)   LMP 06/17/2011   SpO2 99%   BMI 21.71 kg/m    Subjective:    Patient ID: Jill Poole, female    DOB: Aug 16, 1961, 59 y.o.   MRN: [id number]  HPI: Jill Poole is a 59 y.o. female  Chief Complaint  Patient presents with  . Depression  . Hypertension   DEPRESSION- has only needed her klonopin about every 2-3 days. She last took it the day before yesterday.  Mood status: better Satisfied with current treatment?: yes Symptom severity: mild  Duration of current treatment : chronic Side effects: no Medication compliance: excellent compliance Psychotherapy/counseling: no  Depressed mood: yes Anxious mood: yes Anhedonia: no Significant weight loss or gain: no Insomnia: yes hard to fall asleep Fatigue: yes Feelings of worthlessness or guilt: yes Impaired concentration/indecisiveness: no Suicidal ideations: no Hopelessness: no Crying spells: no Depression screen [facility] 2/9 04/14/2020 03/04/2020 01/12/2020 12/14/2019 11/20/2019  Decreased Interest 0 0 0 1 0  Down, Depressed, Hopeless 0 0 1 1 1   PHQ - 2 Score 0 0 1 2 1   Altered sleeping 3 0 3 3 1   Tired, decreased energy 3 0 0 1 0  Change in appetite 0 0 3 0 0  Feeling bad or failure about yourself  0 0 0 1 1  Trouble concentrating 0 0 0 1 0  Moving slowly or fidgety/restless 0 3 1 0 0  Suicidal thoughts 0 0 0 0 0  PHQ-9 Score 6 3 8 8 3   Difficult doing work/chores Not difficult at all Not difficult at all Not difficult at all Somewhat difficult Somewhat difficult  Some recent data might be hidden   GAD 7 : Generalized Anxiety Score 04/14/2020 03/04/2020 01/12/2020 12/14/2019  Nervous, Anxious, on Edge 0 3 3 1   Control/stop worrying 3 0 1 3  Worry too much - different things 3 1 3 2   Trouble relaxing 3 3 3 3   Restless 3 3 2 3   Easily annoyed or irritable 0 0 2 0  Afraid - awful might happen 0 0 0 1    Total GAD 7 Score 12 10 14 13   Anxiety Difficulty Not difficult at all Not difficult at all Not difficult at all Not difficult at all    HYPERTENSION Hypertension status: controlled  Satisfied with current treatment? yes Duration of hypertension: chronic BP monitoring frequency:  not checking BP medication side effects:  no Medication compliance: good compliance Aspirin: no Recurrent headaches: no Visual changes: no Palpitations: no Dyspnea: no Chest pain: no Lower extremity edema: no Dizzy/lightheaded: no  Relevant past medical, surgical, family and social history reviewed and updated as indicated. Interim medical history since our last visit reviewed. Allergies and medications reviewed and updated.  Review of Systems  Constitutional: Negative.   Respiratory: Negative.   Cardiovascular: Negative.   Musculoskeletal: Negative.   Skin: Negative.   Psychiatric/Behavioral: Positive for dysphoric mood. Negative for agitation, behavioral problems, confusion, decreased concentration, hallucinations, self-injury, sleep disturbance and suicidal ideas. The patient is nervous/anxious. The patient is not hyperactive.     Per HPI unless specifically indicated above     Objective:    BP (!) 149/89 (BP Location: Left Arm, Cuff Size: Normal)   Pulse 77   Temp 98.8 F (37.1 C) (Oral)   Wt 118 lb (53.5 kg)   LMP 06/17/2011  SpO2 99%   BMI 21.71 kg/m   Wt Readings from Last 3 Encounters:  04/14/20 118 lb (53.5 kg)  02/05/20 118 lb (53.5 kg)  01/12/20 119 lb (54 kg)    Physical Exam Vitals and nursing note reviewed.  Constitutional:      General: She is not in acute distress.    Appearance: Normal appearance. She is not ill-appearing, toxic-appearing or diaphoretic.  HENT:     Head: Normocephalic and atraumatic.     Right Ear: External ear normal.     Left Ear: External ear normal.     Nose: Nose normal.     Mouth/Throat:     Mouth: Mucous membranes are moist.      Pharynx: Oropharynx is clear.  Eyes:     General: No scleral icterus.       Right eye: No discharge.        Left eye: No discharge.     Extraocular Movements: Extraocular movements intact.     Conjunctiva/sclera: Conjunctivae normal.     Pupils: Pupils are equal, round, and reactive to light.  Cardiovascular:     Rate and Rhythm: Normal rate and regular rhythm.     Pulses: Normal pulses.     Heart sounds: Normal heart sounds. No murmur heard.  No friction rub. No gallop.   Pulmonary:     Effort: Pulmonary effort is normal. No respiratory distress.     Breath sounds: Normal breath sounds. No stridor. No wheezing, rhonchi or rales.  Chest:     Chest wall: No tenderness.  Musculoskeletal:        General: Normal range of motion.     Cervical back: Normal range of motion and neck supple.  Skin:    General: Skin is warm and dry.     Capillary Refill: Capillary refill takes less than 2 seconds.     Coloration: Skin is not jaundiced or pale.     Findings: No bruising, erythema, lesion or rash.  Neurological:     General: No focal deficit present.     Mental Status: She is alert and oriented to person, place, and time. Mental status is at baseline.  Psychiatric:        Mood and Affect: Mood normal.        Behavior: Behavior normal.        Thought Content: Thought content normal.        Judgment: Judgment normal.     Results for orders placed or performed in visit on 04/14/20  CBC with Differential/Platelet  Result Value Ref Range   WBC 5.8 3.4 - 10.8 x10E3/uL   RBC 3.70 (L) 3.77 - 5.28 x10E6/uL   Hemoglobin 12.3 11.1 - 15.9 g/dL   Hematocrit 36.8 34.0 - 46.6 %   MCV 100 (H) 79 - 97 fL   MCH 33.2 (H) 26.6 - 33.0 pg   MCHC 33.4 31 - 35 g/dL   RDW 12.7 11.7 - 15.4 %   Platelets 228 150 - 450 x10E3/uL   Neutrophils 51 Not Estab. %   Lymphs 38 Not Estab. %   Monocytes 9 Not Estab. %   Eos 1 Not Estab. %   Basos 1 Not Estab. %   Neutrophils Absolute 3.0 1 - 7 x10E3/uL    Lymphocytes Absolute 2.2 0 - 3 x10E3/uL   Monocytes Absolute 0.5 0 - 0 x10E3/uL   EOS (ABSOLUTE) 0.1 0.0 - 0.4 x10E3/uL   Basophils Absolute 0.0 0 - 0 x10E3/uL  Immature Granulocytes 0 Not Estab. %   Immature Grans (Abs) 0.0 0.0 - 0.1 x10E3/uL  Comprehensive metabolic panel  Result Value Ref Range   Glucose 77 65 - 99 mg/dL   BUN 11 6 - 24 mg/dL   Creatinine, Ser 0.91 0.57 - 1.00 mg/dL   GFR calc non Af Amer 70 >59 mL/min/1.73   GFR calc Af Amer 81 >59 mL/min/1.73   BUN/Creatinine Ratio 12 9 - 23   Sodium 125 (L) 134 - 144 mmol/L   Potassium 4.7 3.5 - 5.2 mmol/L   Chloride 89 (L) 96 - 106 mmol/L   CO2 22 20 - 29 mmol/L   Calcium 9.3 8.7 - 10.2 mg/dL   Total Protein 7.7 6.0 - 8.5 g/dL   Albumin 4.6 3.8 - 4.9 g/dL   Globulin, Total 3.1 1.5 - 4.5 g/dL   Albumin/Globulin Ratio 1.5 1.2 - 2.2   Bilirubin Total 0.5 0.0 - 1.2 mg/dL   Alkaline Phosphatase 141 (H) 48 - 121 IU/L   AST 32 0 - 40 IU/L   ALT 15 0 - 32 IU/L  Lipid Panel w/o Chol/HDL Ratio  Result Value Ref Range   Cholesterol, Total 224 (H) 100 - 199 mg/dL   Triglycerides 91 0 - 149 mg/dL   HDL 116 >39 mg/dL   VLDL Cholesterol Cal 15 5 - 40 mg/dL   LDL Chol Calc (NIH) 93 0 - 99 mg/dL  TSH  Result Value Ref Range   TSH 1.230 0.450 - 4.500 uIU/mL  [id number] 11+Oxyco+Alc+Crt-Bund  Result Value Ref Range   Ethanol WILL FOLLOW    Ethanol WILL FOLLOW    Amphetamines, Urine WILL FOLLOW    Amphetamines WILL FOLLOW    Amphetamine WILL FOLLOW    Amphetamine GC/MS Conf WILL FOLLOW    Methamphetamine WILL FOLLOW    Methamphetamine Quant, Ur WILL FOLLOW    Barbiturate WILL FOLLOW    BENZODIAZ UR QL WILL FOLLOW    Cannabinoid Quant, Ur WILL FOLLOW    Cocaine (Metabolite) WILL FOLLOW    OPIATE SCREEN URINE WILL FOLLOW    Opiates WILL FOLLOW      Codeine WILL FOLLOW       Codeine Confirm WILL FOLLOW      Morphine WILL FOLLOW       Morphine Confirm WILL FOLLOW      Hydromorphone WILL FOLLOW       Hydromorphone Confirm WILL  FOLLOW      Hydrocodone WILL FOLLOW       Hydrocodone Confirm WILL FOLLOW    Oxycodone/Oxymorphone, Urine WILL FOLLOW    Phencyclidine WILL FOLLOW    Methadone Screen, Urine WILL FOLLOW    Propoxyphene WILL FOLLOW    Meperidine WILL FOLLOW    Tramadol WILL FOLLOW    Creatinine WILL FOLLOW    pH, Urine WILL FOLLOW       Assessment & Plan:   Problem List Items Addressed This Visit      Cardiovascular and Mediastinum   HTN (hypertension) - Primary    Running a little high today. Work on diet and exercise and DASH diet. Recheck 3 months. Call with any concerns.       Relevant Medications   lisinopril (ZESTRIL) 10 MG tablet   Other Relevant Orders   CBC with Differential/Platelet (Completed)   Comprehensive metabolic panel (Completed)   TSH (Completed)     Other   Anxiety    Stable. Will decrease her klonopin as she's not needing it as much.  Continue lexapro. Call with any concerns. Continue to monitor.       Relevant Medications   traZODone (DESYREL) 100 MG tablet   hydrOXYzine (ATARAX/VISTARIL) 25 MG tablet   escitalopram (LEXAPRO) 20 MG tablet   LORazepam (ATIVAN) 0.5 MG tablet   Other Relevant Orders   CBC with Differential/Platelet (Completed)   Comprehensive metabolic panel (Completed)   TSH (Completed)   Moderate episode of recurrent major depressive disorder (HCC)    Stable. Will decrease her klonopin as she's not needing it as much. Continue lexapro. Call with any concerns. Continue to monitor.       Relevant Medications   traZODone (DESYREL) 100 MG tablet   hydrOXYzine (ATARAX/VISTARIL) 25 MG tablet   escitalopram (LEXAPRO) 20 MG tablet   LORazepam (ATIVAN) 0.5 MG tablet   Other Relevant Orders   CBC with Differential/Platelet (Completed)   Comprehensive metabolic panel (Completed)   TSH (Completed)    Other Visit Diagnoses    Screening for cholesterol level       Labs drawn today. Await results. Treat as needed.    Relevant Orders   Lipid Panel w/o  Chol/HDL Ratio (Completed)   Long-term use of high-risk medication       Due for Utox. Done today.   Relevant Orders   [id number] 11+Oxyco+Alc+Crt-Bund (Completed)       Follow up plan: Return in about 3 months (around 07/15/2020).

## 2020-04-15 ENCOUNTER — Encounter: Payer: Self-pay | Admitting: Family Medicine

## 2020-04-15 LAB — COMPREHENSIVE METABOLIC PANEL
ALT: 15 IU/L (ref 0–32)
AST: 32 IU/L (ref 0–40)
Albumin/Globulin Ratio: 1.5 (ref 1.2–2.2)
Albumin: 4.6 g/dL (ref 3.8–4.9)
Alkaline Phosphatase: 141 IU/L — ABNORMAL HIGH (ref 48–121)
BUN/Creatinine Ratio: 12 (ref 9–23)
BUN: 11 mg/dL (ref 6–24)
Bilirubin Total: 0.5 mg/dL (ref 0.0–1.2)
CO2: 22 mmol/L (ref 20–29)
Calcium: 9.3 mg/dL (ref 8.7–10.2)
Chloride: 89 mmol/L — ABNORMAL LOW (ref 96–106)
Creatinine, Ser: 0.91 mg/dL (ref 0.57–1.00)
GFR calc Af Amer: 81 mL/min/{1.73_m2} (ref >59)
GFR calc non Af Amer: 70 mL/min/{1.73_m2} (ref >59)
Globulin, Total: 3.1 g/dL (ref 1.5–4.5)
Glucose: 77 mg/dL (ref 65–99)
Potassium: 4.7 mmol/L (ref 3.5–5.2)
Sodium: 125 mmol/L — ABNORMAL LOW (ref 134–144)
Total Protein: 7.7 g/dL (ref 6.0–8.5)

## 2020-04-15 LAB — CBC WITH DIFFERENTIAL/PLATELET
Basophils Absolute: 0 10*3/uL (ref 0.0–0.2)
Basos: 1 %
EOS (ABSOLUTE): 0.1 10*3/uL (ref 0.0–0.4)
Eos: 1 %
Hematocrit: 36.8 % (ref 34.0–46.6)
Hemoglobin: 12.3 g/dL (ref 11.1–15.9)
Immature Grans (Abs): 0 10*3/uL (ref 0.0–0.1)
Immature Granulocytes: 0 %
Lymphocytes Absolute: 2.2 10*3/uL (ref 0.7–3.1)
Lymphs: 38 %
MCH: 33.2 pg — ABNORMAL HIGH (ref 26.6–33.0)
MCHC: 33.4 g/dL (ref 31.5–35.7)
MCV: 100 fL — ABNORMAL HIGH (ref 79–97)
Monocytes Absolute: 0.5 10*3/uL (ref 0.1–0.9)
Monocytes: 9 %
Neutrophils Absolute: 3 10*3/uL (ref 1.4–7.0)
Neutrophils: 51 %
Platelets: 228 10*3/uL (ref 150–450)
RBC: 3.7 x10E6/uL — ABNORMAL LOW (ref 3.77–5.28)
RDW: 12.7 % (ref 11.7–15.4)
WBC: 5.8 10*3/uL (ref 3.4–10.8)

## 2020-04-15 LAB — LIPID PANEL W/O CHOL/HDL RATIO
Cholesterol, Total: 224 mg/dL — ABNORMAL HIGH (ref 100–199)
HDL: 116 mg/dL (ref >39)
LDL Chol Calc (NIH): 93 mg/dL (ref 0–99)
Triglycerides: 91 mg/dL (ref 0–149)
VLDL Cholesterol Cal: 15 mg/dL (ref 5–40)

## 2020-04-15 LAB — TSH: TSH: 1.23 u[IU]/mL (ref 0.450–4.500)

## 2020-04-17 ENCOUNTER — Encounter: Payer: Self-pay | Admitting: Family Medicine

## 2020-04-17 NOTE — Assessment & Plan Note (Signed)
Stable. Will decrease her klonopin as she's not needing it as much. Continue lexapro. Call with any concerns. Continue to monitor.

## 2020-04-17 NOTE — Assessment & Plan Note (Signed)
Running a little high today. Work on diet and exercise and DASH diet. Recheck 3 months. Call with any concerns.

## 2020-04-21 LAB — DRUG SCREEN 764883 11+OXYCO+ALC+CRT-BUND
BENZODIAZ UR QL: NEGATIVE ng/mL
Barbiturate: NEGATIVE ng/mL
Cannabinoid Quant, Ur: NEGATIVE ng/mL
Cocaine (Metabolite): NEGATIVE ng/mL
Creatinine: 73 mg/dL (ref 20.0–300.0)
Meperidine: NEGATIVE ng/mL
Methadone Screen, Urine: NEGATIVE ng/mL
Oxycodone/Oxymorphone, Urine: NEGATIVE ng/mL
Phencyclidine: NEGATIVE ng/mL
Propoxyphene: NEGATIVE ng/mL
Tramadol: NEGATIVE ng/mL
pH, Urine: 5.6 (ref 4.5–8.9)

## 2020-04-21 LAB — DRUG PROFILE 799016: Amphetamines: NEGATIVE

## 2020-04-21 LAB — OPIATES CONFIRMATION, URINE: Opiates: NEGATIVE ng/mL

## 2020-04-21 LAB — PANEL 764016: Ethanol: 0.091 %

## 2020-05-10 IMAGING — MG DIGITAL DIAGNOSTIC BILATERAL MAMMOGRAM WITH TOMO AND CAD
8 series · 8 of 24 positions shown · non-contrast
Comparison: Previous exam(s).

CLINICAL DATA: Patient is here for workup of left breast. Patient
reports no pain or lumps in either breast. The patient's physician's
note indicated no focal lumps felt in either breast.

EXAM:
DIGITAL DIAGNOSTIC BILATERAL MAMMOGRAM WITH CAD AND TOMO

[R MLO synth-2D]
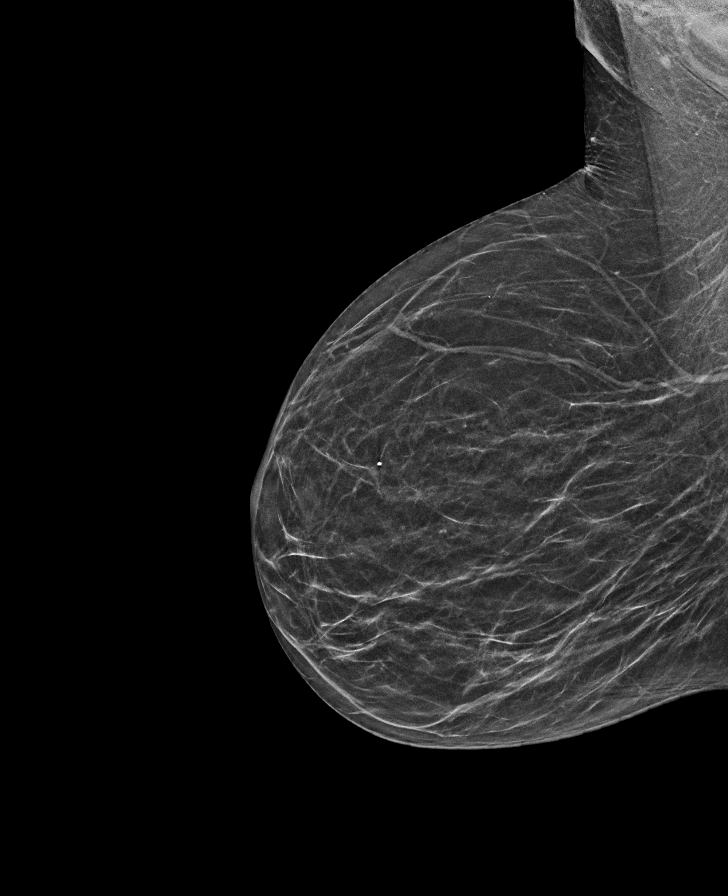

[L CC synth-2D]
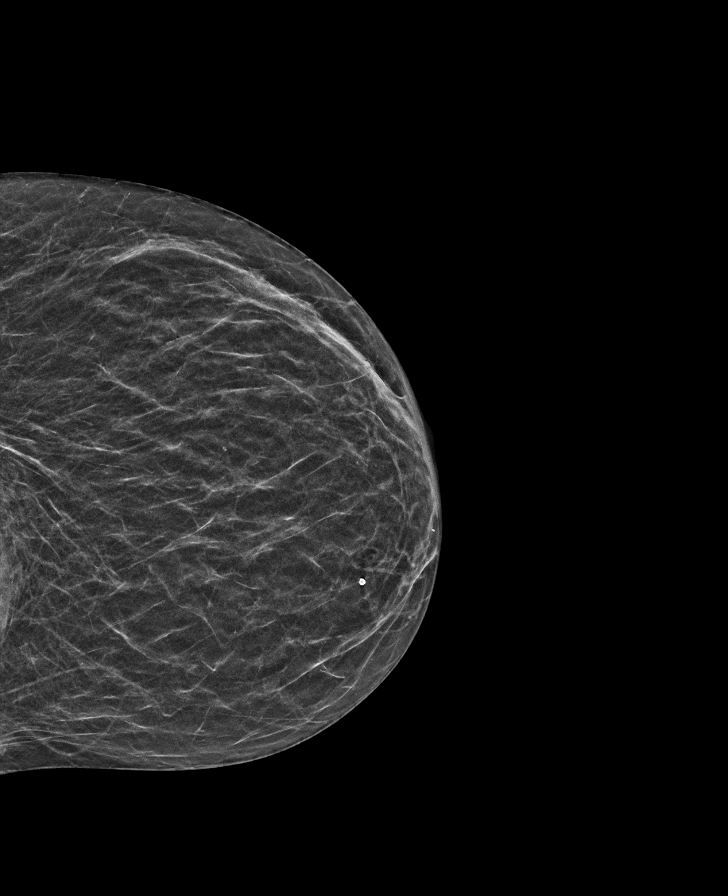

[R CC synth-2D]
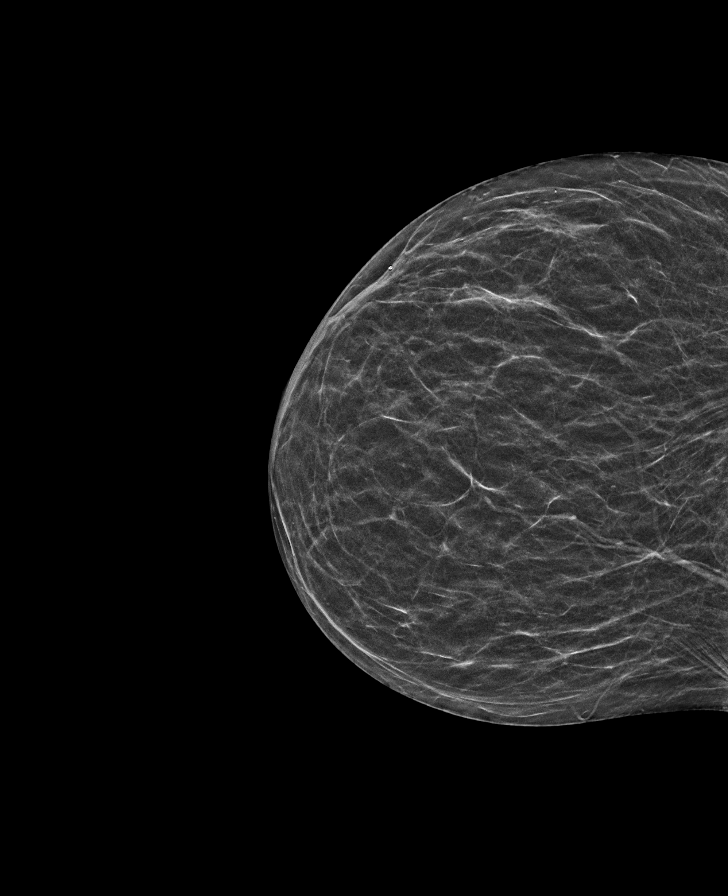

[L MLO synth-2D]
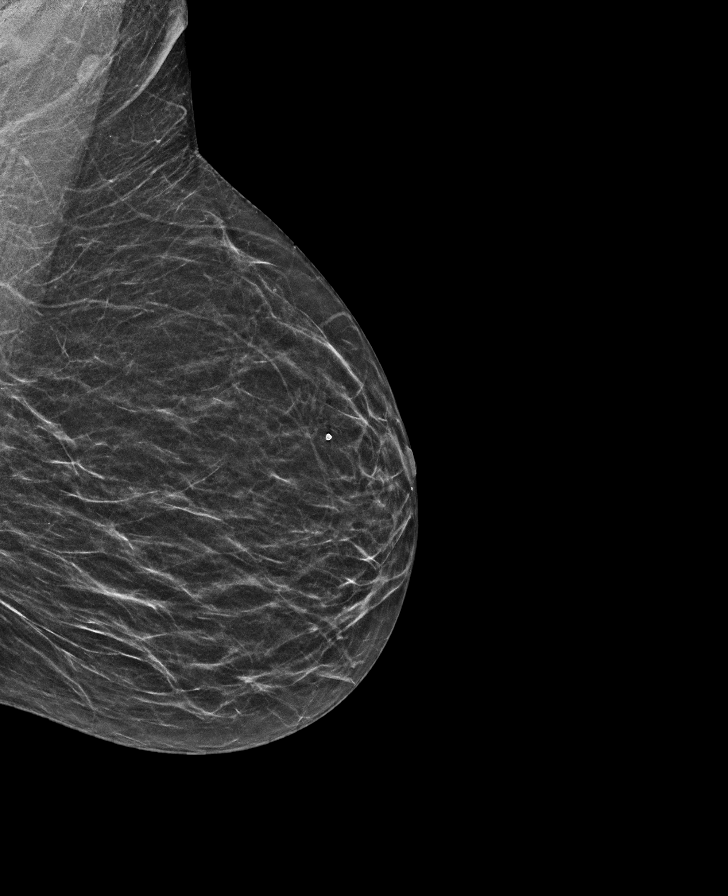

[R MLO tomo · tomo slice 22/43.0]
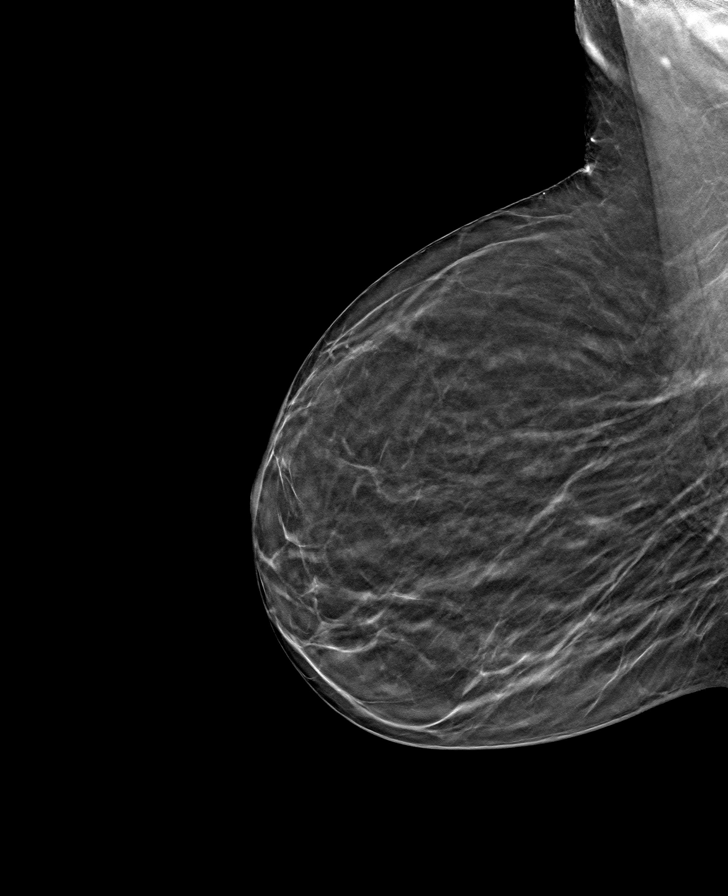

[L MLO tomo · tomo slice 21/41.0]
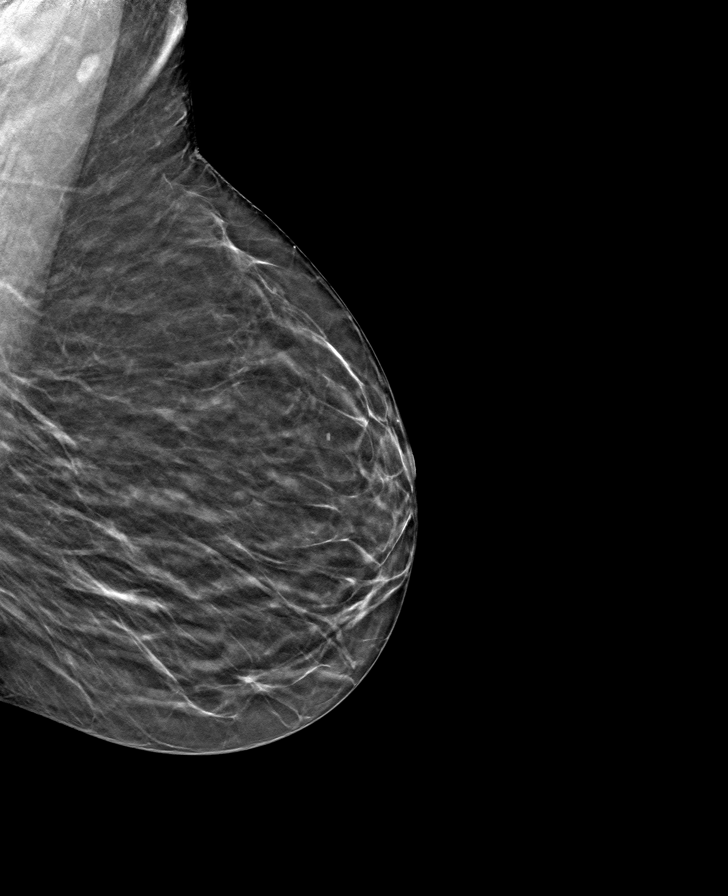

[R CC tomo · tomo slice 20/39.0]
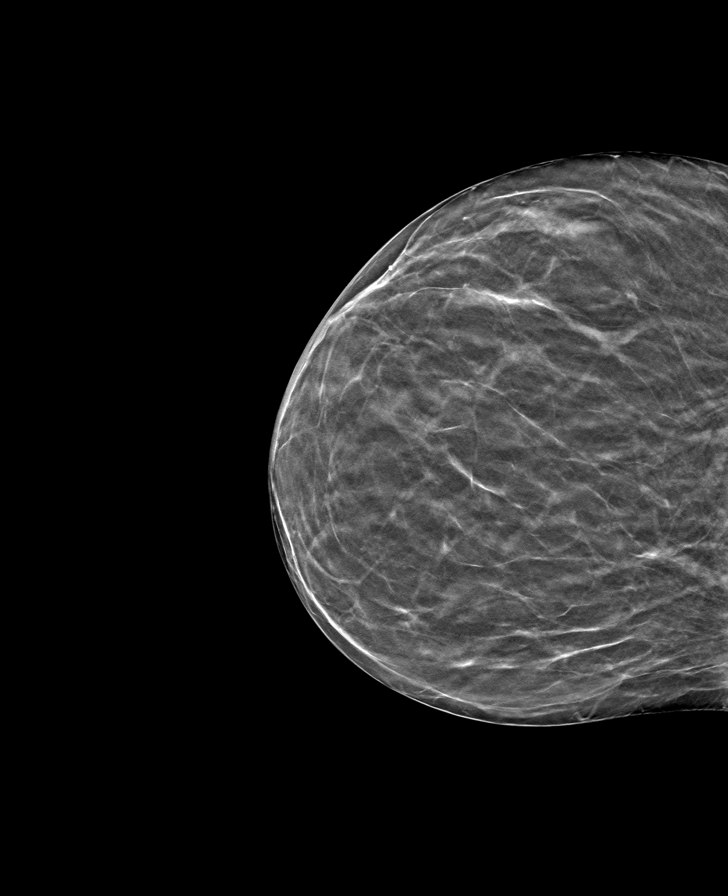

[L CC tomo · tomo slice 19/38.0]
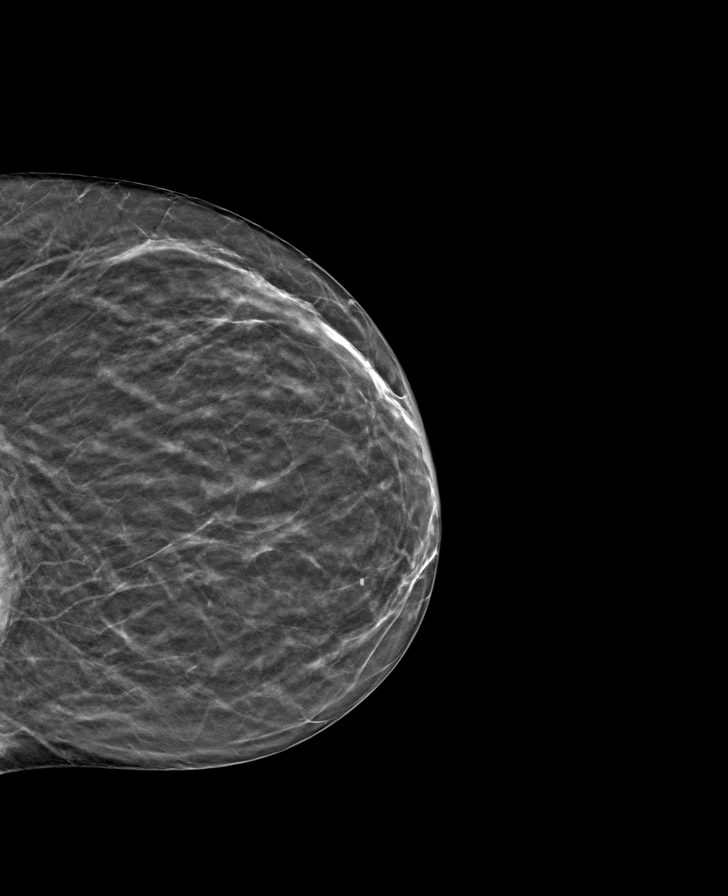

[8 of 24 positions shown; findings below may reference images not displayed]

ACR Breast Density Category b: There are scattered areas of
fibroglandular density.
FINDINGS: Cc and MLO views of bilateral breasts are submitted. No suspicious
abnormalities identified bilaterally.

Mammographic images were processed with CAD.
IMPRESSION: Negative.

RECOMMENDATION:
Routine screening mammogram in 1 year.

I have discussed the findings and recommendations with the patient.
Results were also provided in writing at the conclusion of the
visit. If applicable, a reminder letter will be sent to the patient
regarding the next appointment.

BI-RADS CATEGORY  1: Negative.

## 2020-05-21 DIAGNOSIS — W19XXXA Unspecified fall, initial encounter: Secondary | ICD-10-CM | POA: Diagnosis not present

## 2020-05-21 DIAGNOSIS — R52 Pain, unspecified: Secondary | ICD-10-CM | POA: Diagnosis not present

## 2020-05-22 ENCOUNTER — Encounter (HOSPITAL_COMMUNITY): Payer: Self-pay | Admitting: Emergency Medicine

## 2020-05-22 ENCOUNTER — Emergency Department (HOSPITAL_COMMUNITY)
Admission: EM | Admit: 2020-05-22 | Discharge: 2020-05-22 | Disposition: A | Discharge status: Home / Self Care | Payer: Medicaid Other | Attending: Emergency Medicine | Admitting: Emergency Medicine

## 2020-05-22 ENCOUNTER — Other Ambulatory Visit: Payer: Self-pay

## 2020-05-22 ENCOUNTER — Emergency Department (HOSPITAL_COMMUNITY): Payer: Medicaid Other

## 2020-05-22 DIAGNOSIS — S32591A Other specified fracture of right pubis, initial encounter for closed fracture: Secondary | ICD-10-CM | POA: Insufficient documentation

## 2020-05-22 DIAGNOSIS — Z79899 Other long term (current) drug therapy: Secondary | ICD-10-CM | POA: Insufficient documentation

## 2020-05-22 DIAGNOSIS — W19XXXA Unspecified fall, initial encounter: Secondary | ICD-10-CM | POA: Diagnosis not present

## 2020-05-22 DIAGNOSIS — M7989 Other specified soft tissue disorders: Secondary | ICD-10-CM | POA: Diagnosis not present

## 2020-05-22 DIAGNOSIS — F17210 Nicotine dependence, cigarettes, uncomplicated: Secondary | ICD-10-CM | POA: Insufficient documentation

## 2020-05-22 DIAGNOSIS — S32511A Fracture of superior rim of right pubis, initial encounter for closed fracture: Secondary | ICD-10-CM | POA: Diagnosis not present

## 2020-05-22 DIAGNOSIS — S42401A Unspecified fracture of lower end of right humerus, initial encounter for closed fracture: Secondary | ICD-10-CM | POA: Diagnosis not present

## 2020-05-22 DIAGNOSIS — S50311A Abrasion of right elbow, initial encounter: Secondary | ICD-10-CM | POA: Diagnosis not present

## 2020-05-22 DIAGNOSIS — M25421 Effusion, right elbow: Secondary | ICD-10-CM | POA: Diagnosis not present

## 2020-05-22 DIAGNOSIS — Z7951 Long term (current) use of inhaled steroids: Secondary | ICD-10-CM | POA: Insufficient documentation

## 2020-05-22 DIAGNOSIS — J449 Chronic obstructive pulmonary disease, unspecified: Secondary | ICD-10-CM | POA: Diagnosis not present

## 2020-05-22 DIAGNOSIS — I10 Essential (primary) hypertension: Secondary | ICD-10-CM | POA: Insufficient documentation

## 2020-05-22 DIAGNOSIS — Z7982 Long term (current) use of aspirin: Secondary | ICD-10-CM | POA: Diagnosis not present

## 2020-05-22 DIAGNOSIS — J45909 Unspecified asthma, uncomplicated: Secondary | ICD-10-CM | POA: Diagnosis not present

## 2020-05-22 DIAGNOSIS — M25551 Pain in right hip: Secondary | ICD-10-CM | POA: Diagnosis not present

## 2020-05-22 DIAGNOSIS — Y92009 Unspecified place in unspecified non-institutional (private) residence as the place of occurrence of the external cause: Secondary | ICD-10-CM

## 2020-05-22 DIAGNOSIS — S32501A Unspecified fracture of right pubis, initial encounter for closed fracture: Secondary | ICD-10-CM | POA: Diagnosis not present

## 2020-05-22 DIAGNOSIS — S59901A Unspecified injury of right elbow, initial encounter: Secondary | ICD-10-CM | POA: Diagnosis present

## 2020-05-22 DIAGNOSIS — M419 Scoliosis, unspecified: Secondary | ICD-10-CM | POA: Diagnosis not present

## 2020-05-22 MED ORDER — OXYCODONE-ACETAMINOPHEN 5-325 MG PO TABS
1.0000 | ORAL_TABLET | Freq: Once | ORAL | Status: AC
  Administered 2020-05-22: 1 via ORAL
  Filled 2020-05-22: qty 1

## 2020-05-22 MED ORDER — OXYCODONE-ACETAMINOPHEN 5-325 MG PO TABS: 1.0000 | ORAL_TABLET | Freq: Four times a day (QID) | ORAL | 0 refills | Status: DC | PRN

## 2020-05-22 NOTE — Discharge Instructions (Signed)
Ice packs over the elbow to help reduce the swelling and for comfort.  Ice packs will also help with the pelvic fracture in your groin.  Take the Percocet as prescribed for pain.  You can also take it with the naproxen that you already have.  Use the walker to help you ambulate if you can.  You will probably need assistance for the next several days.  Please call Dr. Ruthe Mannan office to get an appointment to be rechecked.  You have a "right superior pubic ramus fracture at the pubic symphysis" and fluid in your right elbow joint which is usually associated with a fracture, however we cannot see a fracture on your x-ray tonight.  You did not want to get CT scan of your fractures tonight.  Keep your abrasions clean and dry.  Recheck if you feel the abrasion is getting infected.  Return to the emergency department for any problems listed in the head injury sheet.

## 2020-05-22 NOTE — ED Provider Notes (Addendum)
[facility] EMERGENCY DEPARTMENT Provider Note   CSN: [id number] Arrival date & time: 05/22/20  0010   Time seen 2:44 AM  History Chief Complaint  Patient presents with  . Fall    Jill Poole is a 59 y.o. female.  HPI   Patient states this evening she was carrying groceries into the house and her right arm and she was walking across some gravel and acorns and her right foot slid up in front of her and she fell backwards.  She hit her head but denies loss of consciousness and denies headache now.  She denies any visual changes.  She states her only episode of nausea was when they were doing x-rays of her elbow and were moving her arm around.  She denies any vomiting.  She complains of pain in her right groin and with weightbearing since.  Patient is left-handed.  She states her immunizations including tetanus are up-to-date.  She states her roommate had a walker that she can use.  Patient states she took naproxen about 3 hours ago without relief of pain.  PCP Valerie Roys, DO    Past Medical History:  Diagnosis Date  . Asthma   . Back pain   . Concussion   . COPD (chronic obstructive pulmonary disease) ([place])   . DDD (degenerative disc disease), lumbar   . Depression   . Post traumatic stress disorder (PTSD)     Patient Active Problem List   Diagnosis Date Noted  . Hyponatremia 01/28/2019  . Moderate episode of recurrent major depressive disorder ([place]) 12/23/2018  . HTN (hypertension) 04/01/2018  . GERD (gastroesophageal reflux disease) 02/25/2017  . Rosacea 09/14/2016  . Insomnia 09/14/2016  . 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 DDD (degenerative disc disease), lumbar   . COPD (chronic obstructive pulmonary disease) ([place])   . Anxiety 02/29/2016  . Infantile idiopathic scoliosis of thoracolumbar region 02/29/2016  . Carpal tunnel syndrome on left 09/15/2014  . Smoking 09/15/2014  . Left leg weakness 03/18/2014  . Lumbar radiculopathy 03/18/2014  . Nicotine dependence  12/02/2013  . Sciatica 12/02/2013    Past Surgical History:  Procedure Laterality Date  . TIBIA FRACTURE SURGERY    . TUBAL LIGATION    . WRIST SURGERY       OB History   No obstetric history on file.     Family History  Problem Relation Age of Onset  . Arthritis Mother   . Hypertension Mother   . Hypertension Sister   . Diabetes Sister   . Cancer Sister   . Heart disease Brother   . Asthma Brother   . Lung cancer Brother   . Other Son        Lipoma  . Cancer Maternal Grandmother        Stomach  . Breast cancer Maternal Aunt 64    Social History   Tobacco Use  . Smoking status: Current Every Day Smoker    Packs/day: 1.00    Types: Cigarettes  . Smokeless tobacco: Never Used  Vaping Use  . Vaping Use: Never used  Substance Use Topics  . Alcohol use: Yes    Alcohol/week: 6.0 standard drinks    Types: 6 Glasses of wine per week    Comment: once every two weeks  . Drug use: No    Home Medications Prior to Admission medications   Medication Sig Start Date End Date Taking? Authorizing Provider  acetaminophen (TYLENOL) 500 MG tablet Take 500-1,000 mg by mouth every  6 (six) hours as needed for mild pain, moderate pain or fever.    [provider]  albuterol (VENTOLIN HFA) 108 (90 Base) MCG/ACT inhaler INHALE 2 PUFFS BY MOUTH EVERY 6 HOURS AS NEEDED FOR WHEEZING OR SHORTNESS OF BREATH 04/14/20   Johnson, Megan P, DO  aspirin 81 MG tablet Take 81 mg by mouth daily.    [provider]  B Complex Vitamins (VITAMIN B COMPLEX PO) Take by mouth daily.    [provider]  cyclobenzaprine (FLEXERIL) 10 MG tablet TAKE 1 TABLET BY MOUTH EVERYDAY AT BEDTIME 06/09/19   Johnson, Megan P, DO  escitalopram (LEXAPRO) 20 MG tablet Take 1 tablet (20 mg total) by mouth daily. 04/14/20   Johnson, Megan P, DO  Fluticasone-Salmeterol (ADVAIR) 250-50 MCG/DOSE AEPB Inhale 1 puff into the lungs 2 (two) times daily. 10/02/19   Johnson, Megan P, DO  hydrOXYzine  (ATARAX/VISTARIL) 25 MG tablet Take 1-2 tablets (25-50 mg total) by mouth every 8 (eight) hours as needed for anxiety. 04/14/20   Johnson, Megan P, DO  lisinopril (ZESTRIL) 10 MG tablet Take 1.5 tablets (15 mg total) by mouth daily. 04/14/20   Johnson, Megan P, DO  LORazepam (ATIVAN) 0.5 MG tablet Take 1 tablet (0.5 mg total) by mouth 2 (two) times daily as needed for anxiety. 04/14/20   Johnson, Megan P, DO  naproxen (NAPROSYN) 500 MG tablet Take 1 tablet (500 mg total) by mouth 2 (two) times daily with a meal. 04/14/20   Johnson, Megan P, DO  omeprazole (PRILOSEC) 40 MG capsule TAKE 1 CAPSULE BY MOUTH EVERY DAY 04/14/20   Johnson, Megan P, DO  ondansetron (ZOFRAN ODT) 8 MG disintegrating tablet Take 1 tablet (8 mg total) by mouth every 8 (eight) hours as needed. 10/02/19   Johnson, Megan P, DO  oxyCODONE-acetaminophen (PERCOCET) 5-325 MG tablet Take 1 tablet by mouth every 6 (six) hours as needed for severe pain. 05/22/20   Rolland Porter, MD  QUEtiapine (SEROQUEL) 25 MG tablet Take 1 tablet (25 mg total) by mouth at bedtime. 12/14/19   Johnson, Megan P, DO  traZODone (DESYREL) 100 MG tablet Take 1 tablet (100 mg total) by mouth at bedtime. 04/14/20   Park Liter P, DO    Allergies    Adenosine, Keflex [cephalexin], Tramadol, Codeine, and Hydrocodone-acetaminophen  Review of Systems   Review of Systems  All other systems reviewed and are negative.   Physical Exam Updated Vital Signs BP 119/72 (BP Location: Right Arm)   Pulse 84   Temp 98.2 F (36.8 C) (Oral)   Resp 15   Ht 5\' 3"  (1.6 m)   Wt 54.4 kg   LMP 06/17/2011   SpO2 97%   BMI 21.26 kg/m   Physical Exam Vitals and nursing note reviewed.  Constitutional:      General: She is not in acute distress.    Appearance: Normal appearance. She is normal weight. She is not toxic-appearing.  HENT:     Head: Normocephalic and atraumatic.     Right Ear: External ear normal.     Left Ear: External ear normal.  Eyes:     Extraocular  Movements: Extraocular movements intact.     Conjunctiva/sclera: Conjunctivae normal.  Cardiovascular:     Rate and Rhythm: Normal rate.     Pulses: Normal pulses.     Heart sounds: No murmur heard.   Pulmonary:     Effort: Pulmonary effort is normal. No respiratory distress.  Musculoskeletal:     Cervical back:  Normal range of motion.     Comments: Patient has moderate swelling and bruising around her right elbow.  She is tender to palpation and on any attempted range of motion.  She has good distal pulses.  On exam of her right lower extremity there is no shortening of the right leg, no external or internal rotation.  When I approached her on the stretcher she had her knees flexed and she is able to extend her knee without difficulty.  She points to her medial right groin as to where her discomfort is which corresponds with her x-ray findings.  Skin:    General: Skin is warm and dry.     Comments: Patient has an abrasion on her posterior right elbow.  Neurological:     General: No focal deficit present.     Mental Status: She is alert and oriented to person, place, and time.     Cranial Nerves: No cranial nerve deficit.  Psychiatric:        Mood and Affect: Mood normal.        Behavior: Behavior normal.        Thought Content: Thought content normal.       ED Results / Procedures / Treatments   Labs (all labs ordered are listed, but only abnormal results are displayed) Labs Reviewed - No data to display  EKG None  Radiology DG Elbow Complete Right  Result Date: 05/22/2020 CLINICAL DATA:  Fall, right elbow pain EXAM: RIGHT ELBOW - COMPLETE 3+ VIEW COMPARISON:  None. FINDINGS: Four view radiograph right elbow demonstrates normal alignment. No definite fracture or dislocation though there is a right elbow effusion raising the question of a radio occult fracture. Extensive soft tissue swelling is seen superficial to the olecranon. IMPRESSION: Right elbow effusion. Repeat  radiographs could be obtained in 10-14 days to assess for the presence of an underlying fracture. Alternatively, is CT or MRI examination could be performed. Extensive soft tissue swelling superficial to the olecranon. Electronically Signed   By: Fidela Salisbury MD   On: 05/22/2020 02:16   DG HIP UNILAT WITH PELVIS 2-3 VIEWS RIGHT  Result Date: 05/22/2020 CLINICAL DATA:  Fall, right hip pain the EXAM: DG HIP (WITH OR WITHOUT PELVIS) 2-3V RIGHT COMPARISON:  None. FINDINGS: There is an acute fracture of the right superior pubic ramus at the pubic symphysis. Additionally, there is mild diastasis of the pubic symphysis. No other definite fracture or dislocation. Both femoral heads are well seated within the acetabula and hip joint spaces are preserved. Lumbar levoscoliosis is incompletely visualized. IMPRESSION: Right superior pubic ramus fracture at the pubic symphysis with mild diastasis of the pubic symphysis. Electronically Signed   By: Fidela Salisbury MD   On: 05/22/2020 02:21    Procedures Procedures (including critical care time)  Medications Ordered in ED Medications  oxyCODONE-acetaminophen (PERCOCET/ROXICET) 5-325 MG per tablet 1 tablet (has no administration in time range)    ED Course  I have reviewed the triage vital signs and the nursing notes.  Pertinent labs & imaging results that were available during my care of the patient were reviewed by me and considered in my medical decision making (see chart for details).    MDM Rules/Calculators/A&P                         Patient did not want to wait and get CT scans done to assess her elbow and her pelvic fracture.  Her abrasion was  cleaned and dressed and she was placed in a posterior splint to support her elbow.  She had a sling applied.  Patient does not have a local orthopedist she was referred to the local orthopedic group.  Patient states when she fell she hit her head however she does not have a headache and CT was not done at this  time.  She was given head injury precautions.  Nurses report patient was able to transfer out of the wheelchair into the stretcher.  We discussed that most fractures take 6 to 8 weeks to heal however the worst pain should be much improved in the first 10 to 14 days.  Final Clinical Impression(s) / ED Diagnoses Final diagnoses:  Fall in home, initial encounter  Occult closed fracture of right elbow, initial encounter  Abrasion of right elbow, initial encounter  Other closed fracture of right pubis, initial encounter [facility])    Rx / DC Orders ED Discharge Orders         Ordered    oxyCODONE-acetaminophen (PERCOCET) 5-325 MG tablet  Every 6 hours PRN        05/22/20 0315         Plan discharge  Rolland Porter, MD, Barbette Or, MD 05/22/20 Danelle Earthly    Rolland Porter, MD 05/22/20 0320

## 2020-05-22 NOTE — ED Triage Notes (Signed)
Patient fell going down a gravel driveway while carrying groceries. Patient complains of right arm pain and does have swelling to the right arm. Patient complains of pain to the right elbow and hip at this time.

## 2020-05-22 NOTE — ED Notes (Signed)
Pt able to transfer from [facility] to stretcher with stanby assist. Dr Tomi Bamberger aware.

## 2020-05-22 NOTE — ED Triage Notes (Signed)
Patient states that she did hit her head upon the fall.

## 2020-05-23 ENCOUNTER — Telehealth: Payer: Self-pay | Admitting: Family Medicine

## 2020-05-23 ENCOUNTER — Encounter: Payer: Self-pay | Admitting: Orthopedic Surgery

## 2020-05-23 ENCOUNTER — Ambulatory Visit (INDEPENDENT_AMBULATORY_CARE_PROVIDER_SITE_OTHER): Payer: Medicaid Other | Admitting: Orthopedic Surgery

## 2020-05-23 VITALS — BP 94/64 | HR 77 | Ht 63.0 in | Wt 120.0 lb

## 2020-05-23 DIAGNOSIS — S5001XA Contusion of right elbow, initial encounter: Secondary | ICD-10-CM

## 2020-05-23 DIAGNOSIS — W010XXA Fall on same level from slipping, tripping and stumbling without subsequent striking against object, initial encounter: Secondary | ICD-10-CM | POA: Diagnosis not present

## 2020-05-23 DIAGNOSIS — S32511A Fracture of superior rim of right pubis, initial encounter for closed fracture: Secondary | ICD-10-CM | POA: Diagnosis not present

## 2020-05-23 NOTE — Telephone Encounter (Signed)
Copied from [place] [phone] [phone]. Topic: General - Other >> May 23, 2020  9:35 AM Leward Quan A wrote: Reason for CRM: Patient called to request a referral from Dr Wynetta Emery for Dr Aline Brochure at [place] Ph#  [phone]. Patient have an appointment scheduled at 11.30 AM today and is needing this referral sent over please. Please advise Ph# [phone]

## 2020-05-23 NOTE — Progress Notes (Signed)
NEW PROBLEM//OFFICE VISIT  Chief Complaint  Patient presents with  . Elbow Injury    right elbow / fell on acorns 05/21/20     59 year old female was carrying groceries and slipped down and fell injured her right pelvis and right elbow, was seen in the ER last night diagnosed with a right superior pubic ramus fracture and a questionable elbow fracture based on large elbow effusion comes in complaining of mild pain in the right groin and mild discomfort in the right elbow   Review of Systems  Constitutional: Negative for chills and fever.  Musculoskeletal: Positive for back pain.     Past Medical History:  Diagnosis Date  . Asthma   . Back pain   . Concussion   . COPD (chronic obstructive pulmonary disease) ([place])   . DDD (degenerative disc disease), lumbar   . Depression   . Post traumatic stress disorder (PTSD)     Past Surgical History:  Procedure Laterality Date  . TIBIA FRACTURE SURGERY    . TUBAL LIGATION    . WRIST SURGERY      Family History  Problem Relation Age of Onset  . Arthritis Mother   . Hypertension Mother   . Hypertension Sister   . Diabetes Sister   . Cancer Sister   . Heart disease Brother   . Asthma Brother   . Lung cancer Brother   . Other Son        Lipoma  . Cancer Maternal Grandmother        Stomach  . Breast cancer Maternal Aunt 54   Social History   Tobacco Use  . Smoking status: Current Every Day Smoker    Packs/day: 1.00    Types: Cigarettes  . Smokeless tobacco: Never Used  Vaping Use  . Vaping Use: Never used  Substance Use Topics  . Alcohol use: Yes    Alcohol/week: 6.0 standard drinks    Types: 6 Glasses of wine per week    Comment: once every two weeks  . Drug use: No    Allergies  Allergen Reactions  . Adenosine   . Keflex [Cephalexin] Nausea And Vomiting  . Tramadol Nausea And Vomiting  . Codeine Nausea And Vomiting and Other (See Comments)    Hot flashes, throwing up sick.  . Hydrocodone-Acetaminophen Nausea  Only    Current Meds  Medication Sig  . acetaminophen (TYLENOL) 500 MG tablet Take 500-1,000 mg by mouth every 6 (six) hours as needed for mild pain, moderate pain or fever.  Marland Kitchen albuterol (VENTOLIN HFA) 108 (90 Base) MCG/ACT inhaler INHALE 2 PUFFS BY MOUTH EVERY 6 HOURS AS NEEDED FOR WHEEZING OR SHORTNESS OF BREATH  . aspirin 81 MG tablet Take 81 mg by mouth daily.  . B Complex Vitamins (VITAMIN B COMPLEX PO) Take by mouth daily.  . cyclobenzaprine (FLEXERIL) 10 MG tablet TAKE 1 TABLET BY MOUTH EVERYDAY AT BEDTIME  . escitalopram (LEXAPRO) 20 MG tablet Take 1 tablet (20 mg total) by mouth daily.  . Fluticasone-Salmeterol (ADVAIR) 250-50 MCG/DOSE AEPB Inhale 1 puff into the lungs 2 (two) times daily.  . hydrOXYzine (ATARAX/VISTARIL) 25 MG tablet Take 1-2 tablets (25-50 mg total) by mouth every 8 (eight) hours as needed for anxiety.  Marland Kitchen lisinopril (ZESTRIL) 10 MG tablet Take 1.5 tablets (15 mg total) by mouth daily.  Marland Kitchen LORazepam (ATIVAN) 0.5 MG tablet Take 1 tablet (0.5 mg total) by mouth 2 (two) times daily as needed for anxiety.  . naproxen (NAPROSYN) 500 MG tablet  Take 1 tablet (500 mg total) by mouth 2 (two) times daily with a meal.  . omeprazole (PRILOSEC) 40 MG capsule TAKE 1 CAPSULE BY MOUTH EVERY DAY  . ondansetron (ZOFRAN ODT) 8 MG disintegrating tablet Take 1 tablet (8 mg total) by mouth every 8 (eight) hours as needed.  Marland Kitchen oxyCODONE-acetaminophen (PERCOCET) 5-325 MG tablet Take 1 tablet by mouth every 6 (six) hours as needed for severe pain.    BP 94/64   Pulse 77   Ht 5\' 3"  (1.6 m)   Wt 120 lb (54.4 kg)   LMP 06/17/2011   BMI 21.26 kg/m   Physical Exam Constitutional:      General: She is not in acute distress.    Appearance: She is well-developed.  Cardiovascular:     Comments: No peripheral edema Skin:    General: Skin is warm and dry.  Neurological:     Mental Status: She is alert and oriented to person, place, and time.     Sensory: No sensory deficit.      Coordination: Coordination normal.     Gait: Gait abnormal.     Deep Tendon Reflexes: Reflexes are normal and symmetric.  Psychiatric:        Mood and Affect: Mood normal.        Behavior: Behavior normal.     Ortho Exam  Right elbow skin abrasions small superficial easily treatable with Neosporin  Range of motion lacks 20 degrees of extension flexion looks good painful rotation mild tenderness radial head lateral elbow  Right hip and pelvis mild pain and discomfort no limb alignment abnormalities neurovascular exam intact    MEDICAL DECISION MAKING  A.  Encounter Diagnoses  Name Primary?  . Closed fracture of superior ramus of right pubis, initial encounter ([place]) Yes  . Contusion of right elbow, initial encounter     B. DATA ANALYSED:   IMAGING: Interpretation of images: Outside images pelvis and hip no hip fracture superior pubic ramus fracture minimal displacement  Radial head looks like it might have a fracture there is an elbow effusion it is unclear at this time whether there is a definitive fracture    C. MANAGEMENT   Looks like possible radial head fracture recommend early active range of motion sling as needed; pelvis can be treated with weightbearing as tolerated    No orders of the defined types were placed in this encounter.     Arther Abbott, MD  05/23/2020 12:50 PM

## 2020-05-23 NOTE — Patient Instructions (Signed)
Sling take the arm out of the sling 2 x a dy and bend elbow 20 x

## 2020-05-26 ENCOUNTER — Telehealth: Payer: Self-pay | Admitting: Orthopedic Surgery

## 2020-05-26 ENCOUNTER — Other Ambulatory Visit: Payer: Self-pay | Admitting: Orthopedic Surgery

## 2020-05-26 MED ORDER — OXYCODONE-ACETAMINOPHEN 5-325 MG PO TABS: 1.0000 | ORAL_TABLET | Freq: Four times a day (QID) | ORAL | 0 refills | Status: AC | PRN

## 2020-05-26 NOTE — Telephone Encounter (Signed)
Patient called for refill: oxyCODONE-acetaminophen (PERCOCET) 5-325 MG tablet 16 tablet  [facility], [address], [place]

## 2020-06-01 ENCOUNTER — Other Ambulatory Visit: Payer: Self-pay | Admitting: Orthopedic Surgery

## 2020-06-01 MED ORDER — OXYCODONE-ACETAMINOPHEN 5-325 MG PO TABS
1.0000 | ORAL_TABLET | Freq: Three times a day (TID) | ORAL | 0 refills | Status: AC | PRN
Start: 2020-06-01 — End: 2020-06-08

## 2020-06-01 NOTE — Telephone Encounter (Signed)
Done

## 2020-06-01 NOTE — Telephone Encounter (Signed)
Patient called for refill: oxyCODONE-acetaminophen (PERCOCET) 5-325 MG tablet 20 tablet  -[facility], [address] [place]

## 2020-06-08 ENCOUNTER — Other Ambulatory Visit: Payer: Self-pay

## 2020-06-08 ENCOUNTER — Ambulatory Visit (INDEPENDENT_AMBULATORY_CARE_PROVIDER_SITE_OTHER): Payer: Medicaid Other | Admitting: Orthopedic Surgery

## 2020-06-08 ENCOUNTER — Encounter: Payer: Self-pay | Admitting: Orthopedic Surgery

## 2020-06-08 ENCOUNTER — Ambulatory Visit: Payer: Medicaid Other

## 2020-06-08 DIAGNOSIS — S32511D Fracture of superior rim of right pubis, subsequent encounter for fracture with routine healing: Secondary | ICD-10-CM

## 2020-06-08 DIAGNOSIS — M5442 Lumbago with sciatica, left side: Secondary | ICD-10-CM | POA: Diagnosis not present

## 2020-06-08 DIAGNOSIS — M545 Low back pain, unspecified: Secondary | ICD-10-CM | POA: Diagnosis not present

## 2020-06-08 DIAGNOSIS — S5001XD Contusion of right elbow, subsequent encounter: Secondary | ICD-10-CM

## 2020-06-08 NOTE — Progress Notes (Signed)
Encounter Diagnoses  Name Primary?  . Closed fracture of superior ramus of right pubis with routine healing, subsequent encounter 05/22/20 Yes  . Contusion of right elbow, subsequent encounter     Right elbow x-ray shows possible fracture of the radial neck nondisplaced this is best seen on the AP view at the lateral radial head neck junction  Palpation reveals tenderness in this area full range of motion of the elbow is observed the soft tissues around the elbow still have some ecchymosis and swelling  Patient is still ambulatory with a walker  Patient has lower back pain and left leg pain, status post pain stimulator.  Patient is referred back to her primary care doctor for referral to spine specialist to evaluate and treat this.  (Not something we treat in this office).  Fu prn

## 2020-06-08 NOTE — Patient Instructions (Signed)
Call to have primary care refer you for your back and leg pain, to a spine clinic since you have had previous spinal work done.

## 2020-06-09 ENCOUNTER — Telehealth: Payer: Self-pay

## 2020-06-09 DIAGNOSIS — M5416 Radiculopathy, lumbar region: Secondary | ICD-10-CM

## 2020-06-09 NOTE — Telephone Encounter (Signed)
Please Advise.  KP

## 2020-06-09 NOTE — Telephone Encounter (Signed)
Copied from [place] [phone]. Topic: Referral - Request for Referral >> Jun 09, 2020 11:42 AM Alanda Slim E wrote: Has patient seen PCP for this complaint? Yes *If NO, is insurance requiring patient see PCP for this issue before PCP can refer them? Referral for which specialty: ortho Preferred provider/office: Dr.Jon Wolfe/ [place] [phone] Reason for referral: Pt has an appt with him already on wed 10.13.21 at 8:30am but needs a referral before then to lower the cost

## 2020-06-10 NOTE — Telephone Encounter (Signed)
Called pt told her that  her RF was placed. Pt verbalized understanding.  KP

## 2020-06-10 NOTE — Telephone Encounter (Signed)
Referral placed.

## 2020-06-13 ENCOUNTER — Other Ambulatory Visit: Payer: Self-pay

## 2020-06-13 ENCOUNTER — Ambulatory Visit (INDEPENDENT_AMBULATORY_CARE_PROVIDER_SITE_OTHER): Payer: Medicaid Other | Admitting: Nurse Practitioner

## 2020-06-13 ENCOUNTER — Encounter: Payer: Self-pay | Admitting: Nurse Practitioner

## 2020-06-13 DIAGNOSIS — M5442 Lumbago with sciatica, left side: Secondary | ICD-10-CM | POA: Diagnosis not present

## 2020-06-13 DIAGNOSIS — M545 Low back pain, unspecified: Secondary | ICD-10-CM | POA: Insufficient documentation

## 2020-06-13 MED ORDER — OXYCODONE-ACETAMINOPHEN 5-325 MG PO TABS
1.0000 | ORAL_TABLET | Freq: Three times a day (TID) | ORAL | 0 refills | Status: AC | PRN
Start: 2020-06-13 — End: 2020-06-16

## 2020-06-13 NOTE — Assessment & Plan Note (Signed)
Acute post-fall, is to return to ortho on Wednesday.  Recommend she maintain this appointment for further guidance and recommendations.  At this time will provide short refills (x 3 days) of Percocet, she is tolerating this and reports benefit, PDMP reviewed.  Advised her not to use this at same time as her Ativan due to risk factors with this.  Continue heat and ice at home.  Recommend she start taking her Gabapentin + decrease Naproxen intake and take Tylenol instead with her Gabapentin.  Return to office as needed and update on ortho visits.

## 2020-06-13 NOTE — Patient Instructions (Signed)
Acute Back Pain, Adult Acute back pain is sudden and usually short-lived. It is often caused by an injury to the muscles and tissues in the back. The injury may result from:  A muscle or ligament getting overstretched or torn (strained). Ligaments are tissues that connect bones to each other. Lifting something improperly can cause a back strain.  Wear and tear (degeneration) of the spinal disks. Spinal disks are circular tissue that provides cushioning between the bones of the spine (vertebrae).  Twisting motions, such as while playing sports or doing yard work.  A hit to the back.  Arthritis. You may have a physical exam, lab tests, and imaging tests to find the cause of your pain. Acute back pain usually goes away with rest and home care. Follow these instructions at home: Managing pain, stiffness, and swelling  Take over-the-counter and prescription medicines only as told by your health care provider.  Your health care provider may recommend applying ice during the first 24-48 hours after your pain starts. To do this: ? Put ice in a plastic bag. ? Place a towel between your skin and the bag. ? Leave the ice on for 20 minutes, 2-3 times a day.  If directed, apply heat to the affected area as often as told by your health care provider. Use the heat source that your health care provider recommends, such as a moist heat pack or a heating pad. ? Place a towel between your skin and the heat source. ? Leave the heat on for 20-30 minutes. ? Remove the heat if your skin turns bright red. This is especially important if you are unable to feel pain, heat, or cold. You have a greater risk of getting burned. Activity   Do not stay in bed. Staying in bed for more than 1-2 days can delay your recovery.  Sit up and stand up straight. Avoid leaning forward when you sit, or hunching over when you stand. ? If you work at a desk, sit close to it so you do not need to lean over. Keep your chin tucked  in. Keep your neck drawn back, and keep your elbows bent at a right angle. Your arms should look like the letter "L." ? Sit high and close to the steering wheel when you drive. Add lower back (lumbar) support to your car seat, if needed.  Take short walks on even surfaces as soon as you are able. Try to increase the length of time you walk each day.  Do not sit, drive, or stand in one place for more than 30 minutes at a time. Sitting or standing for long periods of time can put stress on your back.  Do not drive or use heavy machinery while taking prescription pain medicine.  Use proper lifting techniques. When you bend and lift, use positions that put less stress on your back: ? Bend your knees. ? Keep the load close to your body. ? Avoid twisting.  Exercise regularly as told by your health care provider. Exercising helps your back heal faster and helps prevent back injuries by keeping muscles strong and flexible.  Work with a physical therapist to make a safe exercise program, as recommended by your health care provider. Do any exercises as told by your physical therapist. Lifestyle  Maintain a healthy weight. Extra weight puts stress on your back and makes it difficult to have good posture.  Avoid activities or situations that make you feel anxious or stressed. Stress and anxiety increase muscle   tension and can make back pain worse. Learn ways to manage anxiety and stress, such as through exercise. General instructions  Sleep on a firm mattress in a comfortable position. Try lying on your side with your knees slightly bent. If you lie on your back, put a pillow under your knees.  Follow your treatment plan as told by your health care provider. This may include: ? Cognitive or behavioral therapy. ? Acupuncture or massage therapy. ? Meditation or yoga. Contact a health care provider if:  You have pain that is not relieved with rest or medicine.  You have increasing pain going down  into your legs or buttocks.  Your pain does not improve after 2 weeks.  You have pain at night.  You lose weight without trying.  You have a fever or chills. Get help right away if:  You develop new bowel or bladder control problems.  You have unusual weakness or numbness in your arms or legs.  You develop nausea or vomiting.  You develop abdominal pain.  You feel faint. Summary  Acute back pain is sudden and usually short-lived.  Use proper lifting techniques. When you bend and lift, use positions that put less stress on your back.  Take over-the-counter and prescription medicines and apply heat or ice as directed by your health care provider. This information is not intended to replace advice given to you by your health care provider. Make sure you discuss any questions you have with your health care provider. Document Revised: 12/09/2018 Document Reviewed: 04/03/2017 Elsevier Patient Education  2020 [employer]  

## 2020-06-13 NOTE — Progress Notes (Signed)
BP (!) 137/95 (BP Location: Left Arm, Patient Position: Sitting, Cuff Size: Normal)   Pulse 92   Temp 99.4 F (37.4 C) (Oral)   Ht 5\' 4"  (1.626 m)   Wt 124 lb 6.4 oz (56.4 kg)   LMP 06/17/2011   SpO2 99%   BMI 21.35 kg/m    Subjective:    Patient ID: Jill Poole, female    DOB: Mar 03, 1961, 59 y.o.   MRN: [id number]  HPI: Jill Poole is a 59 y.o. female  Chief Complaint  Patient presents with  . Back Pain   BACK PAIN Presents for back pain that started on the 18th after falling, slipped on acorns. Right superior pubic ramus fracture at the pubic symphysis with mild diastasis of the pubic symphysis on imaging.  Was seen at [facility] on 06/08/20 and given Prednisone + Percocet provided (12 pills on PDMP review) -- she reports back imaging with them and was told "back was really messed up".  She reports the Percocet does help with pain, but is out of this as "only given 12 pills".  She returns to [facility] at 10:30 with Dr. Rogers Blocker.    Recently had fracture to pelvic area and elbow.  Has Gabapentin at home she used in past, has not been using it at present. Duration: weeks Mechanism of injury: fall Location: low back Onset: gradual Severity: 10/10 worst Quality: sharp, dull, aching and shooting Frequency: constant Radiation: L leg below the knee Aggravating factors: movement, walking and prolonged sitting Alleviating factors: rest, heat, laying, NSAIDs, narcotics and muscle relaxer Status: fluctuating Treatments attempted: rest, ice, heat and aleve  Relief with NSAIDs?: mild Nighttime pain:  no Paresthesias / decreased sensation:  no Bowel / bladder incontinence:  no Fevers:  no Dysuria / urinary frequency:  no  Relevant past medical, surgical, family and social history reviewed and updated as indicated. Interim medical history since our last visit reviewed. Allergies and medications reviewed and updated.  Review of Systems  Constitutional: Negative for  activity change, appetite change, diaphoresis, fatigue and fever.  Respiratory: Negative for cough, chest tightness and shortness of breath.   Cardiovascular: Negative for chest pain, palpitations and leg swelling.  Gastrointestinal: Negative.   Musculoskeletal: Positive for back pain.  Neurological: Negative.   Psychiatric/Behavioral: Negative.     Per HPI unless specifically indicated above     Objective:    BP (!) 137/95 (BP Location: Left Arm, Patient Position: Sitting, Cuff Size: Normal)   Pulse 92   Temp 99.4 F (37.4 C) (Oral)   Ht 5\' 4"  (1.626 m)   Wt 124 lb 6.4 oz (56.4 kg)   LMP 06/17/2011   SpO2 99%   BMI 21.35 kg/m   Wt Readings from Last 3 Encounters:  06/13/20 124 lb 6.4 oz (56.4 kg)  05/23/20 120 lb (54.4 kg)  05/22/20 120 lb (54.4 kg)    Physical Exam Vitals and nursing note reviewed.  Constitutional:      General: She is awake. She is not in acute distress.    Appearance: She is well-developed and well-groomed. She is not ill-appearing.  HENT:     Head: Normocephalic.     Right Ear: Hearing normal.     Left Ear: Hearing normal.  Eyes:     General: Lids are normal.        Right eye: No discharge.        Left eye: No discharge.     Conjunctiva/sclera: Conjunctivae normal.  Pupils: Pupils are equal, round, and reactive to light.  Neck:     Vascular: No carotid bruit.  Cardiovascular:     Rate and Rhythm: Normal rate and regular rhythm.     Heart sounds: Normal heart sounds. No murmur heard.  No gallop.   Pulmonary:     Effort: Pulmonary effort is normal. No accessory muscle usage or respiratory distress.     Breath sounds: Normal breath sounds.  Abdominal:     General: Bowel sounds are normal.     Palpations: Abdomen is soft.  Musculoskeletal:     Cervical back: Normal range of motion and neck supple.     Lumbar back: Tenderness present. No swelling, edema, deformity or spasms. Decreased range of motion. Positive left straight leg raise test.  Negative right straight leg raise test.     Right lower leg: No edema.     Left lower leg: No edema.     Comments: No rashes present.  Utilizing walker, antalgic gait.  Tenderness along lower back on palpation.   Skin:    General: Skin is warm and dry.  Neurological:     Mental Status: She is alert and oriented to person, place, and time.  Psychiatric:        Attention and Perception: Attention normal.        Mood and Affect: Mood normal.        Speech: Speech normal.        Behavior: Behavior normal. Behavior is cooperative.        Thought Content: Thought content normal.     Results for orders placed or performed in visit on 04/14/20  CBC with Differential/Platelet  Result Value Ref Range   WBC 5.8 3.4 - 10.8 x10E3/uL   RBC 3.70 (L) 3.77 - 5.28 x10E6/uL   Hemoglobin 12.3 11.1 - 15.9 g/dL   Hematocrit 36.8 34.0 - 46.6 %   MCV 100 (H) 79 - 97 fL   MCH 33.2 (H) 26.6 - 33.0 pg   MCHC 33.4 31 - 35 g/dL   RDW 12.7 11.7 - 15.4 %   Platelets 228 150 - 450 x10E3/uL   Neutrophils 51 Not Estab. %   Lymphs 38 Not Estab. %   Monocytes 9 Not Estab. %   Eos 1 Not Estab. %   Basos 1 Not Estab. %   Neutrophils Absolute 3.0 1 - 7 x10E3/uL   Lymphocytes Absolute 2.2 0 - 3 x10E3/uL   Monocytes Absolute 0.5 0 - 0 x10E3/uL   EOS (ABSOLUTE) 0.1 0.0 - 0.4 x10E3/uL   Basophils Absolute 0.0 0 - 0 x10E3/uL   Immature Granulocytes 0 Not Estab. %   Immature Grans (Abs) 0.0 0.0 - 0.1 x10E3/uL  Comprehensive metabolic panel  Result Value Ref Range   Glucose 77 65 - 99 mg/dL   BUN 11 6 - 24 mg/dL   Creatinine, Ser 0.91 0.57 - 1.00 mg/dL   GFR calc non Af Amer 70 >59 mL/min/1.73   GFR calc Af Amer 81 >59 mL/min/1.73   BUN/Creatinine Ratio 12 9 - 23   Sodium 125 (L) 134 - 144 mmol/L   Potassium 4.7 3.5 - 5.2 mmol/L   Chloride 89 (L) 96 - 106 mmol/L   CO2 22 20 - 29 mmol/L   Calcium 9.3 8.7 - 10.2 mg/dL   Total Protein 7.7 6.0 - 8.5 g/dL   Albumin 4.6 3.8 - 4.9 g/dL   Globulin, Total 3.1 1.5 -  4.5 g/dL   Albumin/Globulin Ratio  1.5 1.2 - 2.2   Bilirubin Total 0.5 0.0 - 1.2 mg/dL   Alkaline Phosphatase 141 (H) 48 - 121 IU/L   AST 32 0 - 40 IU/L   ALT 15 0 - 32 IU/L  Lipid Panel w/o Chol/HDL Ratio  Result Value Ref Range   Cholesterol, Total 224 (H) 100 - 199 mg/dL   Triglycerides 91 0 - 149 mg/dL   HDL 116 >39 mg/dL   VLDL Cholesterol Cal 15 5 - 40 mg/dL   LDL Chol Calc (NIH) 93 0 - 99 mg/dL  TSH  Result Value Ref Range   TSH 1.230 0.450 - 4.500 uIU/mL  [id number] 11+Oxyco+Alc+Crt-Bund  Result Value Ref Range   Ethanol See Final Results Cutoff=0.020 %   Amphetamines, Urine See Final Results Cutoff=1000 ng/mL   Barbiturate Negative Cutoff=200 ng/mL   BENZODIAZ UR QL Negative Cutoff=200 ng/mL   Cannabinoid Quant, Ur Negative Cutoff=50 ng/mL   Cocaine (Metabolite) Negative Cutoff=300 ng/mL   OPIATE SCREEN URINE See Final Results Cutoff=300 ng/mL   Oxycodone/Oxymorphone, Urine Negative Cutoff=300 ng/mL   Phencyclidine Negative Cutoff=25 ng/mL   Methadone Screen, Urine Negative Cutoff=300 ng/mL   Propoxyphene Negative Cutoff=300 ng/mL   Meperidine Negative Cutoff=200 ng/mL   Tramadol Negative Cutoff=200 ng/mL   Creatinine 73.0 20.0 - 300.0 mg/dL   pH, Urine 5.6 4.5 - 8.9  Panel [id number]  Result Value Ref Range   Ethanol 0.091 Cutoff=0.020 %  Drug Profile [phone]  Result Value Ref Range   Amphetamines Negative Cutoff=1000  Opiates Confirmation, Urine  Result Value Ref Range   Opiates Negative Cutoff=300 ng/mL      Assessment & Plan:   Problem List Items Addressed This Visit      Other   Acute low back pain    Acute post-fall, is to return to ortho on Wednesday.  Recommend she maintain this appointment for further guidance and recommendations.  At this time will provide short refills (x 3 days) of Percocet, she is tolerating this and reports benefit, PDMP reviewed.  Advised her not to use this at same time as her Ativan due to risk factors with this.  Continue heat and  ice at home.  Recommend she start taking her Gabapentin + decrease Naproxen intake and take Tylenol instead with her Gabapentin.  Return to office as needed and update on ortho visits.      Relevant Medications   predniSONE (DELTASONE) 20 MG tablet   oxyCODONE-acetaminophen (PERCOCET/ROXICET) 5-325 MG tablet       Follow up plan: Return if symptoms worsen or fail to improve.

## 2020-06-16 ENCOUNTER — Telehealth: Payer: Self-pay | Admitting: Family Medicine

## 2020-06-16 NOTE — Telephone Encounter (Signed)
Thank you, please alert her I provided her short refill recently on Percocet, but for further refills or pain treatment she will need to discuss with ortho who is currently following her for this -- would recommend she alert them that Tylenol is not benefiting her.  Also continue her Gabapentin.  Looks like MRI was ordered by them and hopefull this will provide more guidance.

## 2020-06-16 NOTE — Telephone Encounter (Signed)
On review PDMP she filled two scripts for Percocet on 06/13/20 -- should have enough pain medication for 7 days total.

## 2020-06-16 NOTE — Telephone Encounter (Signed)
Called pt advised of Jill Poole's message, she verbalized understanding

## 2020-06-16 NOTE — Telephone Encounter (Signed)
Pt called stating that she went to the doctor regarding her back. She states that she was scheduled for an MRI, but the provider is not able to give her a medication for back pain. She states that the tylenol is not working to help with her back pain. Please advise.        [facility] [id number] - [place], [place] [address] [place]  [address] [place] [address]  Phone: [phone] Fax: [phone]  Hours: Not open 24 hours

## 2020-06-24 ENCOUNTER — Telehealth: Payer: Self-pay

## 2020-06-24 ENCOUNTER — Other Ambulatory Visit: Payer: Self-pay | Admitting: Nurse Practitioner

## 2020-06-24 DIAGNOSIS — M5442 Lumbago with sciatica, left side: Secondary | ICD-10-CM

## 2020-06-24 NOTE — Telephone Encounter (Signed)
Copied from [place] [id number]. Topic: Referral - Status >> Jun 23, 2020  1:51 PM Yvette Rack wrote: Reason for CRM: Pt stated she would like to be referred to another spine doctor. Pt prefers to go to another spine specialist office >> Jun 24, 2020  7:42 AM Rayann Heman wrote: Pt has already been seen by Orthopedic provider. Pt will need a new referral

## 2020-06-24 NOTE — Telephone Encounter (Signed)
Placed referral to neurosurgery at [facility] in [place] for second opinion per her request.

## 2020-06-24 NOTE — Telephone Encounter (Signed)
Routing to provider who saw and referred patient.

## 2020-06-24 NOTE — Telephone Encounter (Signed)
Called and LVM letting patient know that a new referral has been entered for her.

## 2020-06-29 DIAGNOSIS — M5441 Lumbago with sciatica, right side: Secondary | ICD-10-CM | POA: Diagnosis not present

## 2020-06-29 DIAGNOSIS — W108XXA Fall (on) (from) other stairs and steps, initial encounter: Secondary | ICD-10-CM | POA: Diagnosis not present

## 2020-06-29 DIAGNOSIS — I10 Essential (primary) hypertension: Secondary | ICD-10-CM | POA: Diagnosis not present

## 2020-06-29 DIAGNOSIS — M5137 Other intervertebral disc degeneration, lumbosacral region: Secondary | ICD-10-CM | POA: Diagnosis not present

## 2020-07-13 ENCOUNTER — Other Ambulatory Visit: Payer: Self-pay

## 2020-07-13 ENCOUNTER — Emergency Department (HOSPITAL_COMMUNITY): Payer: Medicaid Other

## 2020-07-13 ENCOUNTER — Emergency Department (HOSPITAL_COMMUNITY)
Admission: EM | Admit: 2020-07-13 | Discharge: 2020-07-13 | Disposition: A | Discharge status: Home / Self Care | Payer: Medicaid Other | Attending: Emergency Medicine | Admitting: Emergency Medicine

## 2020-07-13 ENCOUNTER — Encounter (HOSPITAL_COMMUNITY): Payer: Self-pay

## 2020-07-13 ENCOUNTER — Telehealth: Payer: Self-pay

## 2020-07-13 DIAGNOSIS — J449 Chronic obstructive pulmonary disease, unspecified: Secondary | ICD-10-CM | POA: Insufficient documentation

## 2020-07-13 DIAGNOSIS — S52125A Nondisplaced fracture of head of left radius, initial encounter for closed fracture: Secondary | ICD-10-CM | POA: Insufficient documentation

## 2020-07-13 DIAGNOSIS — Z7982 Long term (current) use of aspirin: Secondary | ICD-10-CM | POA: Diagnosis not present

## 2020-07-13 DIAGNOSIS — F17210 Nicotine dependence, cigarettes, uncomplicated: Secondary | ICD-10-CM | POA: Diagnosis not present

## 2020-07-13 DIAGNOSIS — W010XXA Fall on same level from slipping, tripping and stumbling without subsequent striking against object, initial encounter: Secondary | ICD-10-CM | POA: Insufficient documentation

## 2020-07-13 DIAGNOSIS — S4992XA Unspecified injury of left shoulder and upper arm, initial encounter: Secondary | ICD-10-CM | POA: Diagnosis present

## 2020-07-13 DIAGNOSIS — Y9289 Other specified places as the place of occurrence of the external cause: Secondary | ICD-10-CM | POA: Insufficient documentation

## 2020-07-13 NOTE — Telephone Encounter (Signed)
Appt

## 2020-07-13 NOTE — Telephone Encounter (Signed)
Patient called and stated that she fell on yesterday and wanted to get an order out in so she could go to [facility] for the xray. She didn't want to go to the ER and be waiting with all those sick people. She can be reached at [phone]. Please advise

## 2020-07-13 NOTE — ED Triage Notes (Signed)
Pt comes in c/o left arm pain after tripping and hitting her arm around 5pm .

## 2020-07-13 NOTE — Discharge Instructions (Addendum)
You have a mild radial head fracture  Sling for 48-72 hours. Ibuprofen or acetaminophen every 6 hours. Ice. After 72 hr rest start doing light range of motion exercises to avoid stiffness.  You need evaluation by orthopedist Dr Aline Brochure in 1 week.   Return for worsening pain swelling redness warmth fever purple discoloration or coolness in extremity decrease sensation and strength

## 2020-07-13 NOTE — ED Notes (Signed)
Sling was applied to pt arm, pt states she needs to leave she has a roommate waiting to pick her up outside.  Pt advised that I would notify the provider and she would be able to supply her with discharge instructions.  She did not wish to wait for discharge instructions and declined having her vital signs checked.  Pt ambulated from the ED in no obvious distress under her own power and without difficulty.  Pt leaves the ED prior to receiving discharge instructions.

## 2020-07-13 NOTE — ED Provider Notes (Signed)
[facility] EMERGENCY DEPARTMENT Provider Note   CSN: [id number] Arrival date & time: 07/13/20  1718     History Chief Complaint  Patient presents with  . Arm Pain    Jill Poole is a 59 y.o. female presents to the ED for evaluation of left elbow pain that occurred earlier today after a mechanical fall.  She tripped over something and landed directly on her left elbow.  She is left-hand dominant.  Denies any other physical injuries including headache, loss of consciousness, head injury, neck pain, shoulder pain, wrist or hand pain.  Reports just recently having another fall and having a small fracture on the right elbow.  She thinks she has broken her left elbow this time.  No anticoagulants.  HPI     Past Medical History:  Diagnosis Date  . Asthma   . Back pain   . Concussion   . COPD (chronic obstructive pulmonary disease) ([place])   . DDD (degenerative disc disease), lumbar   . Depression   . Post traumatic stress disorder (PTSD)     Patient Active Problem List   Diagnosis Date Noted  . Acute low back pain 06/13/2020  . Hyponatremia 01/28/2019  . Moderate episode of recurrent major depressive disorder ([place]) 12/23/2018  . HTN (hypertension) 04/01/2018  . GERD (gastroesophageal reflux disease) 02/25/2017  . Rosacea 09/14/2016  . Insomnia 09/14/2016  . 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 DDD (degenerative disc disease), lumbar   . COPD (chronic obstructive pulmonary disease) ([place])   . Anxiety 02/29/2016  . Infantile idiopathic scoliosis of thoracolumbar region 02/29/2016  . Carpal tunnel syndrome on left 09/15/2014  . Smoking 09/15/2014  . Left leg weakness 03/18/2014  . Lumbar radiculopathy 03/18/2014  . Nicotine dependence 12/02/2013  . Sciatica 12/02/2013    Past Surgical History:  Procedure Laterality Date  . TIBIA FRACTURE SURGERY    . TUBAL LIGATION    . WRIST SURGERY       OB History   No obstetric history on file.     Family History  Problem Relation  Age of Onset  . Arthritis Mother   . Hypertension Mother   . Hypertension Sister   . Diabetes Sister   . Cancer Sister   . Heart disease Brother   . Asthma Brother   . Lung cancer Brother   . Other Son        Lipoma  . Cancer Maternal Grandmother        Stomach  . Breast cancer Maternal Aunt 43    Social History   Tobacco Use  . Smoking status: Current Every Day Smoker    Packs/day: 1.00    Types: Cigarettes  . Smokeless tobacco: Never Used  Vaping Use  . Vaping Use: Never used  Substance Use Topics  . Alcohol use: Yes    Alcohol/week: 6.0 standard drinks    Types: 6 Glasses of wine per week    Comment: once every two weeks  . Drug use: No    Home Medications Prior to Admission medications   Medication Sig Start Date End Date Taking? Authorizing Provider  acetaminophen (TYLENOL) 500 MG tablet Take 500-1,000 mg by mouth every 6 (six) hours as needed for mild pain, moderate pain or fever.    [provider]  albuterol (VENTOLIN HFA) 108 (90 Base) MCG/ACT inhaler INHALE 2 PUFFS BY MOUTH EVERY 6 HOURS AS NEEDED FOR WHEEZING OR SHORTNESS OF BREATH 04/14/20   Johnson, Megan P, DO  aspirin 81 MG  tablet Take 81 mg by mouth daily. Patient not taking: Reported on 06/13/2020    [provider]  B Complex Vitamins (VITAMIN B COMPLEX PO) Take by mouth daily. Patient not taking: Reported on 06/13/2020    [provider]  cyclobenzaprine (FLEXERIL) 10 MG tablet TAKE 1 TABLET BY MOUTH EVERYDAY AT BEDTIME 06/09/19   Johnson, Megan P, DO  escitalopram (LEXAPRO) 20 MG tablet Take 1 tablet (20 mg total) by mouth daily. 04/14/20   Johnson, Megan P, DO  Fluticasone-Salmeterol (ADVAIR) 250-50 MCG/DOSE AEPB Inhale 1 puff into the lungs 2 (two) times daily. 10/02/19   Johnson, Megan P, DO  hydrOXYzine (ATARAX/VISTARIL) 25 MG tablet Take 1-2 tablets (25-50 mg total) by mouth every 8 (eight) hours as needed for anxiety. 04/14/20   Johnson, Megan P, DO  lisinopril (ZESTRIL) 10  MG tablet Take 1.5 tablets (15 mg total) by mouth daily. 04/14/20   Johnson, Megan P, DO  LORazepam (ATIVAN) 0.5 MG tablet Take 1 tablet (0.5 mg total) by mouth 2 (two) times daily as needed for anxiety. 04/14/20   Johnson, Megan P, DO  naproxen (NAPROSYN) 500 MG tablet Take 1 tablet (500 mg total) by mouth 2 (two) times daily with a meal. 04/14/20   Johnson, Megan P, DO  omeprazole (PRILOSEC) 40 MG capsule TAKE 1 CAPSULE BY MOUTH EVERY DAY 04/14/20   Johnson, Megan P, DO  ondansetron (ZOFRAN ODT) 8 MG disintegrating tablet Take 1 tablet (8 mg total) by mouth every 8 (eight) hours as needed. 10/02/19   Johnson, Megan P, DO  predniSONE (DELTASONE) 20 MG tablet Take 20 mg by mouth 2 (two) times daily. 06/08/20   [provider]  QUEtiapine (SEROQUEL) 25 MG tablet Take 1 tablet (25 mg total) by mouth at bedtime. 12/14/19   Johnson, Megan P, DO  traZODone (DESYREL) 100 MG tablet Take 1 tablet (100 mg total) by mouth at bedtime. 04/14/20   Park Liter P, DO    Allergies    Adenosine, Keflex [cephalexin], Tramadol, Codeine, and Hydrocodone-acetaminophen  Review of Systems   Review of Systems  Musculoskeletal: Positive for arthralgias.  All other systems reviewed and are negative.   Physical Exam Updated Vital Signs BP (!) 184/111 (BP Location: Left Arm)   Pulse 92   Temp 97.9 F (36.6 C) (Oral)   Resp 18   Ht 5\' 4"  (1.626 m)   Wt 56.2 kg   LMP 06/17/2011   SpO2 99%   BMI 21.28 kg/m   Physical Exam Vitals and nursing note reviewed.  Constitutional:      General: She is not in acute distress.    Appearance: She is well-developed.     Comments: NAD.  HENT:     Head: Normocephalic and atraumatic.     Right Ear: External ear normal.     Left Ear: External ear normal.     Nose: Nose normal.  Eyes:     General: No scleral icterus.    Conjunctiva/sclera: Conjunctivae normal.  Cardiovascular:     Rate and Rhythm: Normal rate.     Heart sounds: No murmur heard.      Comments:  1+ radial and ulnar pulses in the left upper extremity Pulmonary:     Effort: Pulmonary effort is normal.  Musculoskeletal:        General: Tenderness present. No deformity. Normal range of motion.     Cervical back: Normal range of motion and neck supple.     Comments: Elbow: Mild/moderate tenderness in the  medial elbow, radial head.  Patient has moderate preserved range of motion of the elbow including pronation and supination, but cannot fully flex or extend it due to pain.  Small contusion noted in the medial elbow as well.  No crepitus.  Upper and lower arm compartments soft.  No proximal or distal joint tenderness.  Shoulder: No bony tenderness, full range of motion of the shoulder without any pain  C-spine: No midline or paraspinal muscular tenderness.  Forage motion of the neck without any pain.  Skin:    General: Skin is warm and dry.     Capillary Refill: Capillary refill takes less than 2 seconds.  Neurological:     Mental Status: She is alert and oriented to person, place, and time.     Comments: Sensation in the radial, medial and ulnar nerve distribution intact.  Strength against resistance in the elbow, shoulder, wrist and fingers intact.  Psychiatric:        Behavior: Behavior normal.        Thought Content: Thought content normal.        Judgment: Judgment normal.     ED Results / Procedures / Treatments   Labs (all labs ordered are listed, but only abnormal results are displayed) Labs Reviewed - No data to display  EKG None  Radiology DG Elbow Complete Left  Result Date: 07/13/2020 CLINICAL DATA:  Pain EXAM: LEFT ELBOW - COMPLETE 3+ VIEW COMPARISON:  None. FINDINGS: There is a nondisplaced vertically oriented fracture seen through the radial head. There is diffuse osteopenia. No dislocation. A small elbow joint effusion is seen. IMPRESSION: Nondisplaced intra-articular radial head fracture. Electronically Signed   By: Prudencio Pair M.D.   On: 07/13/2020 19:53     Procedures Procedures (including critical care time)  Medications Ordered in ED Medications - No data to display  ED Course  I have reviewed the triage vital signs and the nursing notes.  Pertinent labs & imaging results that were available during my care of the patient were reviewed by me and considered in my medical decision making (see chart for details).  Clinical Course as of Jul 13 2322  Wed Jul 13, 2020  2143 IMPRESSION: Nondisplaced intra-articular radial head fracture.    DG Elbow Complete Left [CG]  2321 There is a nondisplaced vertically oriented fracture seen through the radial head. There is diffuse osteopenia. No dislocation. A small elbow joint effusion is seen.     [CG]    Clinical Course User Index [CG] Kinnie Feil, PA-C   MDM Rules/Calculators/A&P                         Mechanical fall with left elbow pain.   X-ray ordered in triage.  This was personally visualized and interpreted.  Shows a nondisplaced intra-articular radial head fracture and a small elbow joint effusion.    Patient has pretty decent preserved range of motion including flexion, extension, supination and pronation but cannot fully extend or flex due to pain.  No signs of proximal or distal joint involvement or injury.  Normal neuro and vascular status.  Discussed with patient will treat with sling, ice, NSAIDs and repeat orthopedic follow-up/exam within a week.  Patient has been to Dr. Aline Brochure in the past for opposite elbow injury and is familiar with his office.  She was instructed to call tomorrow and make an appointment as soon as possible.  Return precautions discussed.  She is comfortable with this  plan. Walked out without AVS.   Final Clinical Impression(s) / ED Diagnoses Final diagnoses:  Closed nondisplaced fracture of head of left radius, initial encounter    Rx / DC Orders ED Discharge Orders    None       Kinnie Feil, PA-C 07/13/20 2323    Sherwood Gambler, MD 07/14/20 1550

## 2020-07-13 NOTE — Telephone Encounter (Signed)
Called pt she states that she broke her arm and she didn't want to sit in the hospital all day. Advised would need an appt or UC pt said thank you and call was ended

## 2020-07-13 NOTE — Telephone Encounter (Signed)
Copied from [place] [id number]. Topic: General - Other >> Jul 13, 2020 10:35 AM Yvette Rack wrote: Reason for CRM: Pt stated she would like Dr. Wynetta Emery to place an order for imaging of the left arm at [facility]. Cb# [phone]

## 2020-07-14 ENCOUNTER — Telehealth: Payer: Self-pay

## 2020-07-14 NOTE — Telephone Encounter (Signed)
Transition Care Management Follow-up Telephone Call  Date of discharge and from where: 07/13/20 [facility]  How have you been since you were released from the hospital? Patient stated she is feeling about the same, which is to be expected.   Any questions or concerns? No  Items Reviewed:  Did the pt receive and understand the discharge instructions provided? Yes   Medications obtained and verified? No   Other? No   Any new allergies since your discharge? No   Dietary orders reviewed? Yes  Do you have support at home? Yes   Home Care and Equipment/Supplies: Were home health services ordered? not applicable If so, what is the name of the agency? Not applicable  Has the agency set up a time to come to the patient's home? not applicable Were any new equipment or medical supplies ordered?  No What is the name of the medical supply agency? Not applicable Were you able to get the supplies/equipment? not applicable Do you have any questions related to the use of the equipment or supplies? No  Functional Questionnaire: (I = Independent and D = Dependent) ADLs: I  Bathing/Dressing- I  Meal Prep- I  Eating- I  Maintaining continence- I  Transferring/Ambulation- I  Managing Meds- I  Follow up appointments reviewed:   PCP Hospital f/u appt confirmed? Yes  Scheduled to see Ashok Croon on 07/27/20 @ [facility] f/u appt confirmed? Yes  Scheduled to see Bernette Redbird on 07/20/20 @ 8:30am.  Are transportation arrangements needed? No   If their condition worsens, is the pt aware to call PCP or go to the Emergency Dept.? Yes  Was the patient provided with contact information for the PCP's office or ED? Yes  Was to pt encouraged to call back with questions or concerns? Yes  Patient was called back with confirmation of PCP and specialist appointments.

## 2020-07-20 ENCOUNTER — Ambulatory Visit (INDEPENDENT_AMBULATORY_CARE_PROVIDER_SITE_OTHER): Payer: Medicaid Other | Admitting: Orthopedic Surgery

## 2020-07-20 ENCOUNTER — Other Ambulatory Visit: Payer: Self-pay

## 2020-07-20 ENCOUNTER — Encounter: Payer: Self-pay | Admitting: Orthopedic Surgery

## 2020-07-20 VITALS — BP 177/98 | HR 83 | Ht 64.0 in | Wt 124.0 lb

## 2020-07-20 DIAGNOSIS — S52125A Nondisplaced fracture of head of left radius, initial encounter for closed fracture: Secondary | ICD-10-CM

## 2020-07-20 DIAGNOSIS — W19XXXA Unspecified fall, initial encounter: Secondary | ICD-10-CM

## 2020-07-20 NOTE — Patient Instructions (Signed)
DO NOT DO ANY HEAVY LIFTING WITH LEFT ARM

## 2020-07-20 NOTE — Progress Notes (Signed)
Jill Poole  07/20/2020  Body mass index is 21.28 kg/m.  MEDICAL DECISION SECTION:  No diagnosis found.  Imaging XRAYS OUTSIDE FILMS: 4 V ELBOWS : MIN DISPLACED RADIAL HEAD FRX   Plan:  (Rx., Inj., surg., Frx, MRI/CT, XR:2)  She already has near full range of motion no need for sling avoid heavy activities or pressure on the left elbow when getting out of a chair otherwise activities as tolerated x-ray in 4 weeks  HISTORY SECTION :  Chief Complaint  Patient presents with  . Arm Injury    Left, fell 07/13/20   HPI The patient presents for evaluation of mild mild mod severe /  right or left /left elbow pain for 7 days/associated with some form swelling   Review of Systems  Constitutional: Negative for chills and fever.  Musculoskeletal: Positive for back pain and joint pain.  Skin: Negative.      has a past medical history of Asthma, Back pain, Concussion, COPD (chronic obstructive pulmonary disease) ([place]), DDD (degenerative disc disease), lumbar, Depression, and Post traumatic stress disorder (PTSD).   Past Surgical History:  Procedure Laterality Date  . TIBIA FRACTURE SURGERY    . TUBAL LIGATION    . WRIST SURGERY      Social History   Tobacco Use  . Smoking status: Current Every Day Smoker    Packs/day: 1.00    Types: Cigarettes  . Smokeless tobacco: Never Used  Vaping Use  . Vaping Use: Never used  Substance Use Topics  . Alcohol use: Yes    Alcohol/week: 6.0 standard drinks    Types: 6 Glasses of wine per week    Comment: once every two weeks  . Drug use: No    Family History  Problem Relation Age of Onset  . Arthritis Mother   . Hypertension Mother   . Hypertension Sister   . Diabetes Sister   . Cancer Sister   . Heart disease Brother   . Asthma Brother   . Lung cancer Brother   . Other Son        Lipoma  . Cancer Maternal Grandmother        Stomach  . Breast cancer Maternal Aunt 50      Allergies  Allergen Reactions  . Adenosine    . Keflex [Cephalexin] Nausea And Vomiting  . Tramadol Nausea And Vomiting  . Codeine Nausea And Vomiting and Other (See Comments)    Hot flashes, throwing up sick.  . Hydrocodone-Acetaminophen Nausea Only     Current Outpatient Medications:  .  acetaminophen (TYLENOL) 500 MG tablet, Take 500-1,000 mg by mouth every 6 (six) hours as needed for mild pain, moderate pain or fever., Disp: , Rfl:  .  albuterol (VENTOLIN HFA) 108 (90 Base) MCG/ACT inhaler, INHALE 2 PUFFS BY MOUTH EVERY 6 HOURS AS NEEDED FOR WHEEZING OR SHORTNESS OF BREATH, Disp: 8.5 g, Rfl: 2 .  aspirin 81 MG tablet, Take 81 mg by mouth daily. , Disp: , Rfl:  .  B Complex Vitamins (VITAMIN B COMPLEX PO), Take by mouth daily. , Disp: , Rfl:  .  cyclobenzaprine (FLEXERIL) 10 MG tablet, TAKE 1 TABLET BY MOUTH EVERYDAY AT BEDTIME, Disp: 30 tablet, Rfl: 1 .  escitalopram (LEXAPRO) 20 MG tablet, Take 1 tablet (20 mg total) by mouth daily., Disp: 30 tablet, Rfl: 3 .  Fluticasone-Salmeterol (ADVAIR) 250-50 MCG/DOSE AEPB, Inhale 1 puff into the lungs 2 (two) times daily., Disp: 180 each, Rfl: 3 .  hydrOXYzine (ATARAX/VISTARIL) 25  MG tablet, Take 1-2 tablets (25-50 mg total) by mouth every 8 (eight) hours as needed for anxiety., Disp: 540 tablet, Rfl: 1 .  lisinopril (ZESTRIL) 10 MG tablet, Take 1.5 tablets (15 mg total) by mouth daily., Disp: 45 tablet, Rfl: 3 .  LORazepam (ATIVAN) 0.5 MG tablet, Take 1 tablet (0.5 mg total) by mouth 2 (two) times daily as needed for anxiety., Disp: 45 tablet, Rfl: 2 .  naproxen (NAPROSYN) 500 MG tablet, Take 1 tablet (500 mg total) by mouth 2 (two) times daily with a meal., Disp: 180 tablet, Rfl: 1 .  omeprazole (PRILOSEC) 40 MG capsule, TAKE 1 CAPSULE BY MOUTH EVERY DAY, Disp: 90 capsule, Rfl: 1 .  ondansetron (ZOFRAN ODT) 8 MG disintegrating tablet, Take 1 tablet (8 mg total) by mouth every 8 (eight) hours as needed., Disp: 6 tablet, Rfl: 0 .  predniSONE (DELTASONE) 20 MG tablet, Take 20 mg by mouth 2  (two) times daily., Disp: , Rfl:  .  QUEtiapine (SEROQUEL) 25 MG tablet, Take 1 tablet (25 mg total) by mouth at bedtime., Disp: 90 tablet, Rfl: 1 .  traZODone (DESYREL) 100 MG tablet, Take 1 tablet (100 mg total) by mouth at bedtime., Disp: 90 tablet, Rfl: 1   PHYSICAL EXAM SECTION: BP (!) 177/98   Pulse 83   Ht 5\' 4"  (1.626 m)   Wt 124 lb (56.2 kg)   LMP 06/17/2011   BMI 21.28 kg/m   Body mass index is 21.28 kg/m.   General appearance: Well-developed well-nourished no gross deformities  Eyes clear normal vision no evidence of conjunctivitis or jaundice, extraocular muscles intact  ENT: ears hearing normal, nasal passages clear,  Lymph nodes: No lymphadenopathy  Neck is supple, full range of motion  Cardiovascular normal pulse and perfusion right and left upper extremity Neurologically deep tendon reflexes are equal and normal, no sensation loss or deficits no pathologic reflexes  Psychological: Awake alert and oriented x3 mood and affect normal  Skin no lacerations or ulcerations no nodularity no palpable masses, no erythema or nodularity  Musculoskeletal:   Full range of motion left elbow without instability mild tenderness over the radial head mild swelling in the forearm   9:12 AM

## 2020-07-27 ENCOUNTER — Ambulatory Visit: Payer: Medicaid Other | Admitting: Family Medicine

## 2020-07-27 ENCOUNTER — Other Ambulatory Visit: Payer: Self-pay

## 2020-07-27 ENCOUNTER — Encounter: Payer: Self-pay | Admitting: Family Medicine

## 2020-07-27 VITALS — BP 168/101 | HR 80 | Temp 98.4°F | Ht 61.18 in | Wt 126.0 lb

## 2020-07-27 DIAGNOSIS — I10 Essential (primary) hypertension: Secondary | ICD-10-CM

## 2020-07-27 DIAGNOSIS — F419 Anxiety disorder, unspecified: Secondary | ICD-10-CM | POA: Diagnosis not present

## 2020-07-27 DIAGNOSIS — S52125D Nondisplaced fracture of head of left radius, subsequent encounter for closed fracture with routine healing: Secondary | ICD-10-CM

## 2020-07-27 DIAGNOSIS — F331 Major depressive disorder, recurrent, moderate: Secondary | ICD-10-CM

## 2020-07-27 DIAGNOSIS — M47816 Spondylosis without myelopathy or radiculopathy, lumbar region: Secondary | ICD-10-CM | POA: Diagnosis not present

## 2020-07-27 DIAGNOSIS — M5116 Intervertebral disc disorders with radiculopathy, lumbar region: Secondary | ICD-10-CM | POA: Diagnosis not present

## 2020-07-27 DIAGNOSIS — M5441 Lumbago with sciatica, right side: Secondary | ICD-10-CM | POA: Diagnosis not present

## 2020-07-27 DIAGNOSIS — Z23 Encounter for immunization: Secondary | ICD-10-CM

## 2020-07-27 MED ORDER — ESCITALOPRAM OXALATE 20 MG PO TABS
20.0000 mg | ORAL_TABLET | Freq: Every day | ORAL | 1 refills | Status: DC
Start: 2020-07-27 — End: 2020-12-20

## 2020-07-27 MED ORDER — QUETIAPINE FUMARATE 25 MG PO TABS
25.0000 mg | ORAL_TABLET | Freq: Every day | ORAL | 1 refills | Status: DC
Start: 2020-07-27 — End: 2020-12-20

## 2020-07-27 MED ORDER — LORAZEPAM 0.5 MG PO TABS: 0.5000 mg | ORAL_TABLET | Freq: Two times a day (BID) | ORAL | 2 refills | Status: DC | PRN

## 2020-07-27 MED ORDER — LISINOPRIL 30 MG PO TABS
30.0000 mg | ORAL_TABLET | Freq: Every day | ORAL | 1 refills | Status: DC
Start: 2020-07-27 — End: 2020-12-20

## 2020-07-27 NOTE — Progress Notes (Signed)
BP (!) 168/101   Pulse 80   Temp 98.4 F (36.9 C) (Oral)   Ht 5' 1.18" (1.554 m)   Wt 126 lb (57.2 kg)   LMP 06/17/2011   SpO2 99%   BMI 23.67 kg/m    Subjective:    Patient ID: Jill Poole, female    DOB: [birth year], 59 y.o.   MRN: [id number]  HPI: Jill Poole is a 59 y.o. female  Chief Complaint  Patient presents with  . Hospitalization Follow-up    11/10 for eft closed fracture of the left distal head of radius, arm placed in sling and ace bandage, but patient has removed.   Has been wearing her sling. Not wearing it now.   HYPERTENSION Hypertension status: uncontrolled  Satisfied with current treatment? no Duration of hypertension: chronic BP monitoring frequency:  not checking BP medication side effects:  no Medication compliance: good compliance Previous BP meds: lisinopril Aspirin: no Recurrent headaches: no Visual changes: no Palpitations: no Dyspnea: no Chest pain: no Lower extremity edema: no Dizzy/lightheaded: no  ANXIETY/DEPRESSION Duration: chronic Status:stable Anxious mood: yes  Excessive worrying: yes Irritability: no  Sweating: no Nausea: no Palpitations:no Hyperventilation: no Panic attacks: yes Agoraphobia: no  Obscessions/compulsions: no Depressed mood: yes Depression screen [facility] 2/9 07/27/2020 04/14/2020 03/04/2020 01/12/2020 12/14/2019  Decreased Interest - 0 0 0 1  Down, Depressed, Hopeless 1 0 0 1 1  PHQ - 2 Score 1 0 0 1 2  Altered sleeping 3 3 0 3 3  Tired, decreased energy - 3 0 0 1  Change in appetite 3 0 0 3 0  Feeling bad or failure about yourself  0 0 0 0 1  Trouble concentrating 0 0 0 0 1  Moving slowly or fidgety/restless 0 0 3 1 0  Suicidal thoughts 0 0 0 0 0  PHQ-9 Score 7 6 3 8 8   Difficult doing work/chores - Not difficult at all Not difficult at all Not difficult at all Somewhat difficult  Some recent data might be hidden   Anhedonia: no Weight changes: no Insomnia: yes hard to fall asleep  Hypersomnia:  yes Fatigue/loss of energy: yes Feelings of worthlessness: no Feelings of guilt: no Impaired concentration/indecisiveness: no Suicidal ideations: no  Crying spells: no Recent Stressors/Life Changes: yes   Relationship problems: no   Family stress: no     Financial stress: no    Job stress: no    Recent death/loss: no   Relevant past medical, surgical, family and social history reviewed and updated as indicated. Interim medical history since our last visit reviewed. Allergies and medications reviewed and updated.  Review of Systems  Constitutional: Negative.   Respiratory: Negative.   Cardiovascular: Negative.   Gastrointestinal: Negative.   Musculoskeletal: Negative.   Skin: Negative.   Neurological: Negative.   Psychiatric/Behavioral: Negative for agitation, behavioral problems, confusion, decreased concentration, dysphoric mood, hallucinations, self-injury, sleep disturbance and suicidal ideas. The patient is nervous/anxious. The patient is not hyperactive.     Per HPI unless specifically indicated above     Objective:    BP (!) 168/101   Pulse 80   Temp 98.4 F (36.9 C) (Oral)   Ht 5' 1.18" (1.554 m)   Wt 126 lb (57.2 kg)   LMP 06/17/2011   SpO2 99%   BMI 23.67 kg/m   Wt Readings from Last 3 Encounters:  07/27/20 126 lb (57.2 kg)  07/20/20 124 lb (56.2 kg)  07/13/20 124 lb (56.2 kg)    Physical Exam  Vitals and nursing note reviewed.  Constitutional:      General: She is not in acute distress.    Appearance: Normal appearance. She is not ill-appearing, toxic-appearing or diaphoretic.  HENT:     Head: Normocephalic and atraumatic.     Right Ear: External ear normal.     Left Ear: External ear normal.     Nose: Nose normal.     Mouth/Throat:     Mouth: Mucous membranes are moist.     Pharynx: Oropharynx is clear.  Eyes:     General: No scleral icterus.       Right eye: No discharge.        Left eye: No discharge.     Extraocular Movements: Extraocular  movements intact.     Conjunctiva/sclera: Conjunctivae normal.     Pupils: Pupils are equal, round, and reactive to light.  Cardiovascular:     Rate and Rhythm: Normal rate and regular rhythm.     Pulses: Normal pulses.     Heart sounds: Normal heart sounds. No murmur heard.  No friction rub. No gallop.   Pulmonary:     Effort: Pulmonary effort is normal. No respiratory distress.     Breath sounds: Normal breath sounds. No stridor. No wheezing, rhonchi or rales.  Chest:     Chest wall: No tenderness.  Musculoskeletal:        General: Tenderness (L arm) present.     Cervical back: Normal range of motion and neck supple.  Skin:    General: Skin is warm and dry.     Capillary Refill: Capillary refill takes less than 2 seconds.     Coloration: Skin is not jaundiced or pale.     Findings: No bruising, erythema, lesion or rash.  Neurological:     General: No focal deficit present.     Mental Status: She is alert and oriented to person, place, and time. Mental status is at baseline.  Psychiatric:        Mood and Affect: Mood normal.        Behavior: Behavior normal.        Thought Content: Thought content normal.        Judgment: Judgment normal.     Results for orders placed or performed in visit on 04/14/20  CBC with Differential/Platelet  Result Value Ref Range   WBC 5.8 3.4 - 10.8 x10E3/uL   RBC 3.70 (L) 3.77 - 5.28 x10E6/uL   Hemoglobin 12.3 11.1 - 15.9 g/dL   Hematocrit 36.8 34.0 - 46.6 %   MCV 100 (H) 79 - 97 fL   MCH 33.2 (H) 26.6 - 33.0 pg   MCHC 33.4 31 - 35 g/dL   RDW 12.7 11.7 - 15.4 %   Platelets 228 150 - 450 x10E3/uL   Neutrophils 51 Not Estab. %   Lymphs 38 Not Estab. %   Monocytes 9 Not Estab. %   Eos 1 Not Estab. %   Basos 1 Not Estab. %   Neutrophils Absolute 3.0 1.40 - 7.00 x10E3/uL   Lymphocytes Absolute 2.2 0 - 3 x10E3/uL   Monocytes Absolute 0.5 0 - 0 x10E3/uL   EOS (ABSOLUTE) 0.1 0.0 - 0.4 x10E3/uL   Basophils Absolute 0.0 0 - 0 x10E3/uL    Immature Granulocytes 0 Not Estab. %   Immature Grans (Abs) 0.0 0.0 - 0.1 x10E3/uL  Comprehensive metabolic panel  Result Value Ref Range   Glucose 77 65 - 99 mg/dL   BUN 11 6 -  24 mg/dL   Creatinine, Ser 0.91 0.57 - 1.00 mg/dL   GFR calc non Af Amer 70 >59 mL/min/1.73   GFR calc Af Amer 81 >59 mL/min/1.73   BUN/Creatinine Ratio 12 9 - 23   Sodium 125 (L) 134 - 144 mmol/L   Potassium 4.7 3.5 - 5.2 mmol/L   Chloride 89 (L) 96 - 106 mmol/L   CO2 22 20 - 29 mmol/L   Calcium 9.3 8.7 - 10.2 mg/dL   Total Protein 7.7 6.0 - 8.5 g/dL   Albumin 4.6 3.8 - 4.9 g/dL   Globulin, Total 3.1 1.5 - 4.5 g/dL   Albumin/Globulin Ratio 1.5 1.2 - 2.2   Bilirubin Total 0.5 0.0 - 1.2 mg/dL   Alkaline Phosphatase 141 (H) 48 - 121 IU/L   AST 32 0 - 40 IU/L   ALT 15 0 - 32 IU/L  Lipid Panel w/o Chol/HDL Ratio  Result Value Ref Range   Cholesterol, Total 224 (H) 100 - 199 mg/dL   Triglycerides 91 0 - 149 mg/dL   HDL 116 >39 mg/dL   VLDL Cholesterol Cal 15 5 - 40 mg/dL   LDL Chol Calc (NIH) 93 0 - 99 mg/dL  TSH  Result Value Ref Range   TSH 1.230 0.450 - 4.500 uIU/mL  [id number] 11+Oxyco+Alc+Crt-Bund  Result Value Ref Range   Ethanol See Final Results Cutoff=0.020 %   Amphetamines, Urine See Final Results Cutoff=1000 ng/mL   Barbiturate Negative Cutoff=200 ng/mL   BENZODIAZ UR QL Negative Cutoff=200 ng/mL   Cannabinoid Quant, Ur Negative Cutoff=50 ng/mL   Cocaine (Metabolite) Negative Cutoff=300 ng/mL   OPIATE SCREEN URINE See Final Results Cutoff=300 ng/mL   Oxycodone/Oxymorphone, Urine Negative Cutoff=300 ng/mL   Phencyclidine Negative Cutoff=25 ng/mL   Methadone Screen, Urine Negative Cutoff=300 ng/mL   Propoxyphene Negative Cutoff=300 ng/mL   Meperidine Negative Cutoff=200 ng/mL   Tramadol Negative Cutoff=200 ng/mL   Creatinine 73.0 20.0 - 300.0 mg/dL   pH, Urine 5.6 4.5 - 8.9  Panel [id number]  Result Value Ref Range   Ethanol 0.091 Cutoff=0.020 %  Drug Profile [phone]  Result Value Ref Range    Amphetamines Negative Cutoff=1000  Opiates Confirmation, Urine  Result Value Ref Range   Opiates Negative Cutoff=300 ng/mL      Assessment & Plan:   Problem List Items Addressed This Visit      Cardiovascular and Mediastinum   HTN (hypertension) - Primary    Not under good control. Will increase to 30mg  and recheck 1 month. Call with any concerns.       Relevant Medications   lisinopril (ZESTRIL) 30 MG tablet     Other   Anxiety    Under good control on current regimen. Continue current regimen. Continue to monitor. Call with any concerns. Refills given for 3 months. Follow up 3 months.         Relevant Medications   escitalopram (LEXAPRO) 20 MG tablet   LORazepam (ATIVAN) 0.5 MG tablet   Moderate episode of recurrent major depressive disorder ([place])    Under good control on current regimen. Continue current regimen. Continue to monitor. Call with any concerns. Refills given.        Relevant Medications   escitalopram (LEXAPRO) 20 MG tablet   LORazepam (ATIVAN) 0.5 MG tablet    Other Visit Diagnoses    Closed nondisplaced fracture of head of left radius with routine healing, subsequent encounter       Encouraged wearing her brace. Follow up with ortho as needed.  Need for influenza vaccination       Relevant Orders   Flu Vaccine QUAD 6+ mos PF IM (Fluarix Quad PF) (Completed)       Follow up plan: Return in about 4 weeks (around 08/24/2020) for follow up BP.

## 2020-07-27 NOTE — Assessment & Plan Note (Signed)
Under good control on current regimen. Continue current regimen. Continue to monitor. Call with any concerns. Refills given for 3 months. Follow up 3 months.    

## 2020-07-27 NOTE — Assessment & Plan Note (Signed)
Not under good control. Will increase to 30mg  and recheck 1 month. Call with any concerns.

## 2020-07-27 NOTE — Assessment & Plan Note (Signed)
Under good control on current regimen. Continue current regimen. Continue to monitor. Call with any concerns. Refills given.   

## 2020-08-04 DIAGNOSIS — M5137 Other intervertebral disc degeneration, lumbosacral region: Secondary | ICD-10-CM | POA: Diagnosis not present

## 2020-08-04 DIAGNOSIS — M4180 Other forms of scoliosis, site unspecified: Secondary | ICD-10-CM | POA: Diagnosis not present

## 2020-08-04 DIAGNOSIS — M5416 Radiculopathy, lumbar region: Secondary | ICD-10-CM | POA: Diagnosis not present

## 2020-08-10 ENCOUNTER — Encounter: Payer: Self-pay | Admitting: Orthopedic Surgery

## 2020-08-10 ENCOUNTER — Other Ambulatory Visit: Payer: Self-pay

## 2020-08-10 ENCOUNTER — Ambulatory Visit (INDEPENDENT_AMBULATORY_CARE_PROVIDER_SITE_OTHER): Payer: Medicaid Other | Admitting: Orthopedic Surgery

## 2020-08-10 DIAGNOSIS — S32511D Fracture of superior rim of right pubis, subsequent encounter for fracture with routine healing: Secondary | ICD-10-CM

## 2020-08-10 DIAGNOSIS — S52125D Nondisplaced fracture of head of left radius, subsequent encounter for closed fracture with routine healing: Secondary | ICD-10-CM

## 2020-08-10 NOTE — Progress Notes (Signed)
Chief Complaint  Patient presents with  . Elbow Injury    left elbow     59 year old female left elbow radial head fracture doing well continues with full range of motion does complain of some crepitance over the lateral elbow but this should resolve with time  She is neurovascularly intact heading to [place] should be fine follow-up as needed

## 2020-08-15 ENCOUNTER — Ambulatory Visit: Payer: Medicaid Other | Admitting: Orthopedic Surgery

## 2020-08-25 ENCOUNTER — Ambulatory Visit: Payer: Medicaid Other | Admitting: Family Medicine

## 2020-10-19 ENCOUNTER — Other Ambulatory Visit: Payer: Self-pay | Admitting: Family Medicine

## 2020-11-24 ENCOUNTER — Ambulatory Visit (INDEPENDENT_AMBULATORY_CARE_PROVIDER_SITE_OTHER): Payer: Medicaid Other | Admitting: Nurse Practitioner

## 2020-11-24 ENCOUNTER — Encounter: Payer: Self-pay | Admitting: Nurse Practitioner

## 2020-11-24 ENCOUNTER — Other Ambulatory Visit: Payer: Self-pay

## 2020-11-24 DIAGNOSIS — M5416 Radiculopathy, lumbar region: Secondary | ICD-10-CM | POA: Diagnosis not present

## 2020-11-24 DIAGNOSIS — J301 Allergic rhinitis due to pollen: Secondary | ICD-10-CM | POA: Diagnosis not present

## 2020-11-24 DIAGNOSIS — J309 Allergic rhinitis, unspecified: Secondary | ICD-10-CM | POA: Insufficient documentation

## 2020-11-24 MED ORDER — PREDNISONE 20 MG PO TABS: 40.0000 mg | ORAL_TABLET | Freq: Every day | ORAL | 0 refills | Status: AC

## 2020-11-24 MED ORDER — FLUTICASONE PROPIONATE 50 MCG/ACT NA SUSP: 2.0000 | Freq: Every day | NASAL | 6 refills | Status: DC

## 2020-11-24 MED ORDER — LORATADINE 10 MG PO TABS: 10.0000 mg | ORAL_TABLET | Freq: Every day | ORAL | 11 refills | Status: DC

## 2020-11-24 NOTE — Assessment & Plan Note (Signed)
Ongoing issue, not currently taking medications.  At this time will send in Claritin and Flonase to use daily, especially with her underlying COPD would benefit from good allergy control.  May consider Singulair in future.  Will send in 5 day burst of Prednisone 40 MG for cough, which may also help with pain until she sees specialist next week.  At this time recommend avoiding triggers and follow-up as scheduled with PCP.

## 2020-11-24 NOTE — Progress Notes (Signed)
BP (!) 168/86   Wt 120 lb (54.4 kg)   LMP 06/17/2011   BMI 22.54 kg/m    Subjective:    Patient ID: Jill Poole, female    DOB: Jan 02, 1961, 60 y.o.   MRN: [id number]  HPI: Jill Poole is a 60 y.o. female  Chief Complaint  Patient presents with  . Cough    Pt states she has been having a cough for the last 3 weeks. States she gets a tickle in her throat that makes her cough.     . This visit was completed via telephone due to the restrictions of the COVID-19 pandemic. All issues as above were discussed and addressed but no physical exam was performed. If it was felt that the patient should be evaluated in the office, they were directed there. The patient verbally consented to this visit. Patient was unable to complete an audio/visual visit due to Technical difficulties, Lack of internet. Due to the catastrophic nature of the COVID-19 pandemic, this visit was done through audio contact only. . Location of the patient: home . Location of the provider: work . Those involved with this call:  . Provider: Park Liter, DO . CMA: Yvonna Alanis, [place] . Front Desk/Registration: Jill Side  . Time spent on call: 21 minutes on the phone discussing health concerns. 15 minutes total spent in review of patient's record and preparation of their chart.  . I verified patient identity using two factors (patient name and date of birth). Patient consents verbally to being seen via telemedicine visit today.   COUGH Reports she has had a cough for 3 weeks, which she feels is related to allergies.  Not taking any allergy medications.  She reports that is having pain and her spine specialist sent in a patch, but is having issues with drugstore and obtaining -- they stated needed PCP approval for patches per patient report.  She has appointment Wednesday to address with spine specialist.   Fever: no Cough: yes Shortness of breath: no Wheezing: no Chest pain: no Chest tightness: no Chest  congestion: no Nasal congestion: no Runny nose: no Post nasal drip: yes Sneezing: no Sore throat: no Swollen glands: no Sinus pressure: no Headache: no Face pain: no Toothache: no Ear pain: none Ear pressure: none Eyes red/itching:no Eye drainage/crusting: no  Vomiting: no Rash: no Fatigue: yes Sick contacts: no Strep contacts: no  Context: fluctuating Recurrent sinusitis: no Relief with OTC cold/cough medications: no  Treatments attempted: taking cough drops  Relevant past medical, surgical, family and social history reviewed and updated as indicated. Interim medical history since our last visit reviewed. Allergies and medications reviewed and updated.  Review of Systems  Constitutional: Negative.   HENT: Negative.   Respiratory: Positive for cough. Negative for chest tightness, shortness of breath and wheezing.   Cardiovascular: Negative.   Musculoskeletal: Positive for arthralgias.  Neurological: Negative.   Psychiatric/Behavioral: Negative.     Per HPI unless specifically indicated above     Objective:    BP (!) 168/86   Wt 120 lb (54.4 kg)   LMP 06/17/2011   BMI 22.54 kg/m   Wt Readings from Last 3 Encounters:  11/24/20 120 lb (54.4 kg)  07/27/20 126 lb (57.2 kg)  07/20/20 124 lb (56.2 kg)    Physical Exam   Unable to perform due to telephone visit only  Results for orders placed or performed in visit on 04/14/20  CBC with Differential/Platelet  Result Value Ref Range   WBC 5.8  3.4 - 10.8 x10E3/uL   RBC 3.70 (L) 3.77 - 5.28 x10E6/uL   Hemoglobin 12.3 11.1 - 15.9 g/dL   Hematocrit 36.8 34.0 - 46.6 %   MCV 100 (H) 79 - 97 fL   MCH 33.2 (H) 26.6 - 33.0 pg   MCHC 33.4 31.5 - 35.7 g/dL   RDW 12.7 11.7 - 15.4 %   Platelets 228 150 - 450 x10E3/uL   Neutrophils 51 Not Estab. %   Lymphs 38 Not Estab. %   Monocytes 9 Not Estab. %   Eos 1 Not Estab. %   Basos 1 Not Estab. %   Neutrophils Absolute 3.0 1.4 - 7.0 x10E3/uL   Lymphocytes Absolute 2.2  0.7 - 3.1 x10E3/uL   Monocytes Absolute 0.5 0.1 - 0.9 x10E3/uL   EOS (ABSOLUTE) 0.1 0.0 - 0.4 x10E3/uL   Basophils Absolute 0.0 0.0 - 0.2 x10E3/uL   Immature Granulocytes 0 Not Estab. %   Immature Grans (Abs) 0.0 0.0 - 0.1 x10E3/uL  Comprehensive metabolic panel  Result Value Ref Range   Glucose 77 65 - 99 mg/dL   BUN 11 6 - 24 mg/dL   Creatinine, Ser 0.91 0.57 - 1.00 mg/dL   GFR calc non Af Amer 70 >59 mL/min/1.73   GFR calc Af Amer 81 >59 mL/min/1.73   BUN/Creatinine Ratio 12 9 - 23   Sodium 125 (L) 134 - 144 mmol/L   Potassium 4.7 3.5 - 5.2 mmol/L   Chloride 89 (L) 96 - 106 mmol/L   CO2 22 20 - 29 mmol/L   Calcium 9.3 8.7 - 10.2 mg/dL   Total Protein 7.7 6.0 - 8.5 g/dL   Albumin 4.6 3.8 - 4.9 g/dL   Globulin, Total 3.1 1.5 - 4.5 g/dL   Albumin/Globulin Ratio 1.5 1.2 - 2.2   Bilirubin Total 0.5 0.0 - 1.2 mg/dL   Alkaline Phosphatase 141 (H) 48 - 121 IU/L   AST 32 0 - 40 IU/L   ALT 15 0 - 32 IU/L  Lipid Panel w/o Chol/HDL Ratio  Result Value Ref Range   Cholesterol, Total 224 (H) 100 - 199 mg/dL   Triglycerides 91 0 - 149 mg/dL   HDL 116 >39 mg/dL   VLDL Cholesterol Cal 15 5 - 40 mg/dL   LDL Chol Calc (NIH) 93 0 - 99 mg/dL  TSH  Result Value Ref Range   TSH 1.230 0.450 - 4.500 uIU/mL  [id number] 11+Oxyco+Alc+Crt-Bund  Result Value Ref Range   Ethanol See Final Results Cutoff=0.020 %   Amphetamines, Urine See Final Results Cutoff=1000 ng/mL   Barbiturate Negative Cutoff=200 ng/mL   BENZODIAZ UR QL Negative Cutoff=200 ng/mL   Cannabinoid Quant, Ur Negative Cutoff=50 ng/mL   Cocaine (Metabolite) Negative Cutoff=300 ng/mL   OPIATE SCREEN URINE See Final Results Cutoff=300 ng/mL   Oxycodone/Oxymorphone, Urine Negative Cutoff=300 ng/mL   Phencyclidine Negative Cutoff=25 ng/mL   Methadone Screen, Urine Negative Cutoff=300 ng/mL   Propoxyphene Negative Cutoff=300 ng/mL   Meperidine Negative Cutoff=200 ng/mL   Tramadol Negative Cutoff=200 ng/mL   Creatinine 73.0 20.0 - 300.0  mg/dL   pH, Urine 5.6 4.5 - 8.9  Panel [id number]  Result Value Ref Range   Ethanol 0.091 Cutoff=0.020 %  Drug Profile [phone]  Result Value Ref Range   Amphetamines Negative Cutoff=1000  Opiates Confirmation, Urine  Result Value Ref Range   Opiates Negative Cutoff=300 ng/mL      Assessment & Plan:   Problem List Items Addressed This Visit      Respiratory  Allergic rhinitis    Ongoing issue, not currently taking medications.  At this time will send in Claritin and Flonase to use daily, especially with her underlying COPD would benefit from good allergy control.  May consider Singulair in future.  Will send in 5 day burst of Prednisone 40 MG for cough, which may also help with pain until she sees specialist next week.  At this time recommend avoiding triggers and follow-up as scheduled with PCP.        Nervous and Auditory   Lumbar radiculopathy    Continue to follow-up with spine specialist as scheduled, is aware no pain medications will be prescribed today.  Prednisone burst sent for cough and discomfort.         I discussed the assessment and treatment plan with the patient. The patient was provided an opportunity to ask questions and all were answered. The patient agreed with the plan and demonstrated an understanding of the instructions.   The patient was advised to call back or seek an in-person evaluation if the symptoms worsen or if the condition fails to improve as anticipated.   I provided 21+ minutes of time during this encounter.  Follow up plan: Return for as scheduled with Dr. Wynetta Emery.

## 2020-11-24 NOTE — Patient Instructions (Signed)

## 2020-11-24 NOTE — Assessment & Plan Note (Signed)
Continue to follow-up with spine specialist as scheduled, is aware no pain medications will be prescribed today.  Prednisone burst sent for cough and discomfort.

## 2020-11-30 DIAGNOSIS — I10 Essential (primary) hypertension: Secondary | ICD-10-CM | POA: Diagnosis not present

## 2020-11-30 DIAGNOSIS — M5137 Other intervertebral disc degeneration, lumbosacral region: Secondary | ICD-10-CM | POA: Diagnosis not present

## 2020-11-30 DIAGNOSIS — M5416 Radiculopathy, lumbar region: Secondary | ICD-10-CM | POA: Diagnosis not present

## 2020-11-30 DIAGNOSIS — M4180 Other forms of scoliosis, site unspecified: Secondary | ICD-10-CM | POA: Diagnosis not present

## 2020-12-03 ENCOUNTER — Other Ambulatory Visit: Payer: Self-pay | Admitting: Family Medicine

## 2020-12-09 ENCOUNTER — Other Ambulatory Visit: Payer: Self-pay | Admitting: Family Medicine

## 2020-12-14 ENCOUNTER — Telehealth: Payer: Self-pay | Admitting: *Deleted

## 2020-12-14 NOTE — Telephone Encounter (Signed)
Pt brought in Placard form for DMV to be filled by Dr. Wynetta Emery. I had a new form filled for her because she has a different address and Phone Number than on form. Please call when completed. Placed in [place] for review

## 2020-12-14 NOTE — Telephone Encounter (Signed)
Placed in your folder for signature 

## 2020-12-19 ENCOUNTER — Other Ambulatory Visit: Payer: Self-pay | Admitting: Family Medicine

## 2020-12-19 NOTE — Telephone Encounter (Signed)
   Notes to clinic: Medication requested have expired Review for use and new script    Requested Prescriptions  Pending Prescriptions Disp Refills   WIXELA INHUB 250-50 MCG/DOSE AEPB [Pharmacy Med Name: WIXELA INHUB DISKUS 250/50MCG 60S] 180 each 3    Sig: INHALE 1 PUFF INTO THE LUNGS TWICE DAILY      Pulmonology:  Combination Products Passed - 12/19/2020  2:59 PM      Passed - Valid encounter within last 12 months    Recent Outpatient Visits           3 weeks ago Seasonal allergic rhinitis due to pollen   [facility], [place] T, NP   4 months ago Primary hypertension   [employer], Megan P, DO   6 months ago Acute midline low back pain with left-sided sciatica   [facility] [place], Henrine Screws T, NP   8 months ago Essential hypertension   [place], Megan P, DO   9 months ago Essential hypertension   [employer], Megan P, DO                  naproxen (NAPROSYN) 500 MG tablet [Pharmacy Med Name: NAPROXEN 500MG  TABLETS] 180 tablet 1    Sig: TAKE 1 TABLET(500 MG) BY MOUTH TWICE DAILY WITH A MEAL      Analgesics:  NSAIDS Passed - 12/19/2020  2:59 PM      Passed - Cr in normal range and within 360 days    Creatinine  Date Value Ref Range Status  04/14/2020 73.0 20.0 - 300.0 mg/dL Final   Creatinine, Ser  Date Value Ref Range Status  04/14/2020 0.91 0.57 - 1.00 mg/dL Final          Passed - HGB in normal range and within 360 days    Hemoglobin  Date Value Ref Range Status  04/14/2020 12.3 11.1 - 15.9 g/dL Final          Passed - Patient is not pregnant      Passed - Valid encounter within last 12 months    Recent Outpatient Visits           3 weeks ago Seasonal allergic rhinitis due to pollen   [facility], [place] T, NP   4 months ago Primary hypertension   [employer], Megan P, DO   6 months ago Acute midline low back  pain with left-sided sciatica   [facility] [place], Barbaraann Faster, NP   8 months ago Essential hypertension   [place], [place], DO   9 months ago Essential hypertension   [employer], [place], DO

## 2020-12-19 NOTE — Telephone Encounter (Signed)
Pt scheduled for appt tomorrow.  

## 2020-12-20 ENCOUNTER — Ambulatory Visit (INDEPENDENT_AMBULATORY_CARE_PROVIDER_SITE_OTHER): Payer: Medicaid Other | Admitting: Family Medicine

## 2020-12-20 ENCOUNTER — Encounter: Payer: Self-pay | Admitting: Family Medicine

## 2020-12-20 ENCOUNTER — Other Ambulatory Visit: Payer: Self-pay | Admitting: Family Medicine

## 2020-12-20 ENCOUNTER — Other Ambulatory Visit: Payer: Self-pay

## 2020-12-20 VITALS — BP 118/80 | HR 91 | Temp 98.4°F | Wt 122.4 lb

## 2020-12-20 DIAGNOSIS — I10 Essential (primary) hypertension: Secondary | ICD-10-CM | POA: Diagnosis not present

## 2020-12-20 DIAGNOSIS — F331 Major depressive disorder, recurrent, moderate: Secondary | ICD-10-CM

## 2020-12-20 DIAGNOSIS — Z720 Tobacco use: Secondary | ICD-10-CM

## 2020-12-20 DIAGNOSIS — K219 Gastro-esophageal reflux disease without esophagitis: Secondary | ICD-10-CM | POA: Diagnosis not present

## 2020-12-20 DIAGNOSIS — Z13220 Encounter for screening for lipoid disorders: Secondary | ICD-10-CM

## 2020-12-20 DIAGNOSIS — F419 Anxiety disorder, unspecified: Secondary | ICD-10-CM | POA: Diagnosis not present

## 2020-12-20 DIAGNOSIS — J449 Chronic obstructive pulmonary disease, unspecified: Secondary | ICD-10-CM

## 2020-12-20 DIAGNOSIS — M5416 Radiculopathy, lumbar region: Secondary | ICD-10-CM

## 2020-12-20 LAB — URINALYSIS, ROUTINE W REFLEX MICROSCOPIC
Bilirubin, UA: NEGATIVE
Glucose, UA: NEGATIVE
Ketones, UA: NEGATIVE
Leukocytes,UA: NEGATIVE
Nitrite, UA: NEGATIVE
Protein,UA: NEGATIVE
Specific Gravity, UA: 1.01 (ref 1.005–1.030)
Urobilinogen, Ur: 0.2 mg/dL (ref 0.2–1.0)
pH, UA: 6 (ref 5.0–7.5)

## 2020-12-20 LAB — MICROALBUMIN, URINE WAIVED
Creatinine, Urine Waived: 50 mg/dL (ref 10–300)
Microalb, Ur Waived: 30 mg/L — ABNORMAL HIGH (ref 0–19)

## 2020-12-20 LAB — MICROSCOPIC EXAMINATION: WBC, UA: NONE SEEN /hpf (ref 0–5)

## 2020-12-20 MED ORDER — LORAZEPAM 0.5 MG PO TABS: 0.5000 mg | ORAL_TABLET | Freq: Two times a day (BID) | ORAL | 2 refills | Status: DC | PRN

## 2020-12-20 MED ORDER — LISINOPRIL 30 MG PO TABS: 30.0000 mg | ORAL_TABLET | Freq: Every day | ORAL | 1 refills | Status: DC

## 2020-12-20 MED ORDER — HYDROXYZINE HCL 25 MG PO TABS: 25.0000 mg | ORAL_TABLET | Freq: Three times a day (TID) | ORAL | 1 refills | Status: DC | PRN

## 2020-12-20 MED ORDER — ALBUTEROL SULFATE HFA 108 (90 BASE) MCG/ACT IN AERS: INHALATION_SPRAY | RESPIRATORY_TRACT | 2 refills | Status: DC

## 2020-12-20 MED ORDER — NAPROXEN 500 MG PO TABS: 500.0000 mg | ORAL_TABLET | Freq: Two times a day (BID) | ORAL | 1 refills | Status: DC

## 2020-12-20 MED ORDER — QUETIAPINE FUMARATE 25 MG PO TABS: 25.0000 mg | ORAL_TABLET | Freq: Every day | ORAL | 1 refills | Status: DC

## 2020-12-20 MED ORDER — TRAZODONE HCL 100 MG PO TABS: 100.0000 mg | ORAL_TABLET | Freq: Every day | ORAL | 1 refills | Status: DC

## 2020-12-20 MED ORDER — ESCITALOPRAM OXALATE 20 MG PO TABS: 20.0000 mg | ORAL_TABLET | Freq: Every day | ORAL | 1 refills | Status: DC

## 2020-12-20 MED ORDER — FLUTICASONE-SALMETEROL 250-50 MCG/DOSE IN AEPB: 1.0000 | INHALATION_SPRAY | Freq: Two times a day (BID) | RESPIRATORY_TRACT | 3 refills | Status: DC

## 2020-12-20 MED ORDER — OMEPRAZOLE 40 MG PO CPDR: 40.0000 mg | DELAYED_RELEASE_CAPSULE | Freq: Every day | ORAL | 1 refills | Status: DC

## 2020-12-20 NOTE — Assessment & Plan Note (Signed)
Not doing great. Strongly encouraged her to take her seroquel. Lorazepam is only for really bad times and as needed. Refills given for 3 months. Follow up 3 months.

## 2020-12-20 NOTE — Assessment & Plan Note (Signed)
Not doing well. Following with neurosurgery. Continue to work with them. Call with any concerns.

## 2020-12-20 NOTE — Progress Notes (Signed)
BP 118/80   Pulse 91   Temp 98.4 F (36.9 C)   Wt 122 lb 6.4 oz (55.5 kg)   LMP 06/17/2011   SpO2 98%   BMI 22.99 kg/m    Subjective:    Patient ID: Jill Poole, female    DOB: Apr 28, 1961, 60 y.o.   MRN: [id number]  HPI: Jill Poole is a 60 y.o. female  Chief Complaint  Patient presents with  . Hypertension  . Anxiety  . Gastroesophageal Reflux  . Depression   Continues to work with her spine specialists to see if there's something they can do for her back.   HYPERTENSION Hypertension status: controlled  Satisfied with current treatment? yes Duration of hypertension: chronic BP monitoring frequency:  not checking BP medication side effects:  no Medication compliance: excellent compliance Previous BP meds: lisinopril Aspirin: no Recurrent headaches: no Visual changes: no Palpitations: no Dyspnea: no Chest pain: no Lower extremity edema: no Dizzy/lightheaded: no  DEPRESSION Mood status: uncontrolled Satisfied with current treatment?: yes Symptom severity: moderate  Duration of current treatment : chronic Side effects: no Medication compliance: fair compliance Psychotherapy/counseling: no  Previous psychiatric medications: lexapro, seroquel (not taking), trazodone, lorazepam Depressed mood: yes Anxious mood: yes Anhedonia: yes Significant weight loss or gain: no Insomnia: yes hard to fall asleep Fatigue: yes Feelings of worthlessness or guilt: yes Impaired concentration/indecisiveness: no Suicidal ideations: no Hopelessness: no Crying spells: no Depression screen [facility] 2/9 12/20/2020 07/27/2020 04/14/2020 03/04/2020 01/12/2020  Decreased Interest 3 - 0 0 0  Down, Depressed, Hopeless 2 1 0 0 1  PHQ - 2 Score 5 1 0 0 1  Altered sleeping 2 3 3  0 3  Tired, decreased energy 3 - 3 0 0  Change in appetite 3 3 0 0 3  Feeling bad or failure about yourself  0 0 0 0 0  Trouble concentrating 0 0 0 0 0  Moving slowly or fidgety/restless 0 0 0 3 1  Suicidal  thoughts 0 0 0 0 0  PHQ-9 Score 13 7 6 3 8   Difficult doing work/chores Somewhat difficult - Not difficult at all Not difficult at all Not difficult at all  Some recent data might be hidden   GERD GERD control status: controlled  Satisfied with current treatment? yes Heartburn frequency: occasionally Medication side effects: no  Medication compliance: good Dysphagia: no Odynophagia:  no Hematemesis: no Blood in stool: no EGD: no   Relevant past medical, surgical, family and social history reviewed and updated as indicated. Interim medical history since our last visit reviewed. Allergies and medications reviewed and updated.  Review of Systems  Constitutional: Negative.   Respiratory: Negative.   Cardiovascular: Negative.   Gastrointestinal: Negative.   Musculoskeletal: Negative.   Skin: Negative.   Psychiatric/Behavioral: Positive for dysphoric mood. Negative for agitation, behavioral problems, confusion, decreased concentration, hallucinations, self-injury, sleep disturbance and suicidal ideas. The patient is nervous/anxious. The patient is not hyperactive.     Per HPI unless specifically indicated above     Objective:    BP 118/80   Pulse 91   Temp 98.4 F (36.9 C)   Wt 122 lb 6.4 oz (55.5 kg)   LMP 06/17/2011   SpO2 98%   BMI 22.99 kg/m   Wt Readings from Last 3 Encounters:  12/20/20 122 lb 6.4 oz (55.5 kg)  11/24/20 120 lb (54.4 kg)  07/27/20 126 lb (57.2 kg)    Physical Exam Vitals and nursing note reviewed.  Constitutional:  General: She is not in acute distress.    Appearance: Normal appearance. She is not ill-appearing, toxic-appearing or diaphoretic.  HENT:     Head: Normocephalic and atraumatic.     Right Ear: External ear normal.     Left Ear: External ear normal.     Nose: Nose normal.     Mouth/Throat:     Mouth: Mucous membranes are moist.     Pharynx: Oropharynx is clear.  Eyes:     General: No scleral icterus.       Right eye: No  discharge.        Left eye: No discharge.     Extraocular Movements: Extraocular movements intact.     Conjunctiva/sclera: Conjunctivae normal.     Pupils: Pupils are equal, round, and reactive to light.  Cardiovascular:     Rate and Rhythm: Normal rate and regular rhythm.     Pulses: Normal pulses.     Heart sounds: Normal heart sounds. No murmur heard. No friction rub. No gallop.   Pulmonary:     Effort: Pulmonary effort is normal. No respiratory distress.     Breath sounds: Normal breath sounds. No stridor. No wheezing, rhonchi or rales.  Chest:     Chest wall: No tenderness.  Musculoskeletal:        General: Normal range of motion.     Cervical back: Normal range of motion and neck supple.  Skin:    General: Skin is warm and dry.     Capillary Refill: Capillary refill takes less than 2 seconds.     Coloration: Skin is not jaundiced or pale.     Findings: No bruising, erythema, lesion or rash.  Neurological:     General: No focal deficit present.     Mental Status: She is alert and oriented to person, place, and time. Mental status is at baseline.  Psychiatric:        Mood and Affect: Mood normal.        Behavior: Behavior normal.        Thought Content: Thought content normal.        Judgment: Judgment normal.     Results for orders placed or performed in visit on 04/14/20  CBC with Differential/Platelet  Result Value Ref Range   WBC 5.8 3.4 - 10.8 x10E3/uL   RBC 3.70 (L) 3.77 - 5.28 x10E6/uL   Hemoglobin 12.3 11.1 - 15.9 g/dL   Hematocrit 36.8 34.0 - 46.6 %   MCV 100 (H) 79 - 97 fL   MCH 33.2 (H) 26.6 - 33.0 pg   MCHC 33.4 31.5 - 35.7 g/dL   RDW 12.7 11.7 - 15.4 %   Platelets 228 150 - 450 x10E3/uL   Neutrophils 51 Not Estab. %   Lymphs 38 Not Estab. %   Monocytes 9 Not Estab. %   Eos 1 Not Estab. %   Basos 1 Not Estab. %   Neutrophils Absolute 3.0 1.4 - 7.0 x10E3/uL   Lymphocytes Absolute 2.2 0.7 - 3.1 x10E3/uL   Monocytes Absolute 0.5 0.1 - 0.9 x10E3/uL    EOS (ABSOLUTE) 0.1 0.0 - 0.4 x10E3/uL   Basophils Absolute 0.0 0.0 - 0.2 x10E3/uL   Immature Granulocytes 0 Not Estab. %   Immature Grans (Abs) 0.0 0.0 - 0.1 x10E3/uL  Comprehensive metabolic panel  Result Value Ref Range   Glucose 77 65 - 99 mg/dL   BUN 11 6 - 24 mg/dL   Creatinine, Ser 0.91 0.57 - 1.00 mg/dL  GFR calc non Af Amer 70 >59 mL/min/1.73   GFR calc Af Amer 81 >59 mL/min/1.73   BUN/Creatinine Ratio 12 9 - 23   Sodium 125 (L) 134 - 144 mmol/L   Potassium 4.7 3.5 - 5.2 mmol/L   Chloride 89 (L) 96 - 106 mmol/L   CO2 22 20 - 29 mmol/L   Calcium 9.3 8.7 - 10.2 mg/dL   Total Protein 7.7 6.0 - 8.5 g/dL   Albumin 4.6 3.8 - 4.9 g/dL   Globulin, Total 3.1 1.5 - 4.5 g/dL   Albumin/Globulin Ratio 1.5 1.2 - 2.2   Bilirubin Total 0.5 0.0 - 1.2 mg/dL   Alkaline Phosphatase 141 (H) 48 - 121 IU/L   AST 32 0 - 40 IU/L   ALT 15 0 - 32 IU/L  Lipid Panel w/o Chol/HDL Ratio  Result Value Ref Range   Cholesterol, Total 224 (H) 100 - 199 mg/dL   Triglycerides 91 0 - 149 mg/dL   HDL 116 >39 mg/dL   VLDL Cholesterol Cal 15 5 - 40 mg/dL   LDL Chol Calc (NIH) 93 0 - 99 mg/dL  TSH  Result Value Ref Range   TSH 1.230 0.450 - 4.500 uIU/mL  [id number] 11+Oxyco+Alc+Crt-Bund  Result Value Ref Range   Ethanol See Final Results Cutoff=0.020 %   Amphetamines, Urine See Final Results Cutoff=1000 ng/mL   Barbiturate Negative Cutoff=200 ng/mL   BENZODIAZ UR QL Negative Cutoff=200 ng/mL   Cannabinoid Quant, Ur Negative Cutoff=50 ng/mL   Cocaine (Metabolite) Negative Cutoff=300 ng/mL   OPIATE SCREEN URINE See Final Results Cutoff=300 ng/mL   Oxycodone/Oxymorphone, Urine Negative Cutoff=300 ng/mL   Phencyclidine Negative Cutoff=25 ng/mL   Methadone Screen, Urine Negative Cutoff=300 ng/mL   Propoxyphene Negative Cutoff=300 ng/mL   Meperidine Negative Cutoff=200 ng/mL   Tramadol Negative Cutoff=200 ng/mL   Creatinine 73.0 20.0 - 300.0 mg/dL   pH, Urine 5.6 4.5 - 8.9  Panel [id number]  Result Value Ref  Range   Ethanol 0.091 Cutoff=0.020 %  Drug Profile [phone]  Result Value Ref Range   Amphetamines Negative Cutoff=1000  Opiates Confirmation, Urine  Result Value Ref Range   Opiates Negative Cutoff=300 ng/mL      Assessment & Plan:   Problem List Items Addressed This Visit      Cardiovascular and Mediastinum   HTN (hypertension) - Primary    Under good control on current regimen. Continue current regimen. Continue to monitor. Call with any concerns. Refills given. Labs drawn today.        Relevant Orders   CBC with Differential/Platelet   Comprehensive metabolic panel   Microalbumin, Urine Waived   TSH     Respiratory   COPD (chronic obstructive pulmonary disease) (HCC)    Under good control on current regimen. Continue current regimen. Continue to monitor. Call with any concerns. Refills given. Labs drawn today.          Digestive   GERD (gastroesophageal reflux disease)    Under good control on current regimen. Continue current regimen. Continue to monitor. Call with any concerns. Refills given. Labs drawn today.        Relevant Orders   CBC with Differential/Platelet   Comprehensive metabolic panel     Nervous and Auditory   Lumbar radiculopathy    Not doing well. Following with neurosurgery. Continue to work with them. Call with any concerns.       Relevant Medications   pregabalin (LYRICA) 50 MG capsule     Other   Anxiety  Not doing great. Strongly encouraged her to take her seroquel. Lorazepam is only for really bad times and as needed. Refills given for 3 months. Follow up 3 months.       Relevant Orders   CBC with Differential/Platelet   Comprehensive metabolic panel   Moderate episode of recurrent major depressive disorder ([place])    Not doing great. Strongly encouraged her to take her seroquel. Lorazepam is only for really bad times and as needed. Refills given for 3 months. Follow up 3 months.       Relevant Orders   CBC with  Differential/Platelet   Comprehensive metabolic panel    Other Visit Diagnoses    Screening for cholesterol level       Labs drawn today. Await results.    Relevant Orders   Lipid Panel w/o Chol/HDL Ratio   Tobacco abuse       Not interested in quitting at this time. Labs drawn today. Await results.    Relevant Orders   Urinalysis, Routine w reflex microscopic       Follow up plan: Return in about 3 months (around 03/21/2021).

## 2020-12-20 NOTE — Assessment & Plan Note (Signed)
Under good control on current regimen. Continue current regimen. Continue to monitor. Call with any concerns. Refills given. Labs drawn today.   

## 2020-12-20 NOTE — Telephone Encounter (Signed)
Requested medication (s) are due for refill today:   Yes for Naprosyn   Not on the inhaler  Requested medication (s) are on the active medication list:   Yes for Naprosyn   No for the inhaler  Future visit scheduled:   Yes today at 3:00 with Dr. Wynetta Emery 4/19   Last ordered: Naprosyn 04/14/2020 #180, 1 refill   Wixela not on med list.  Returned to be addressed duing OV today at 3:00 with Dr. Wynetta Emery 12/20/2020   Requested Prescriptions  Pending Prescriptions Disp Refills   naproxen (NAPROSYN) 500 MG tablet [Pharmacy Med Name: NAPROXEN 500MG  TABLETS] 180 tablet 1    Sig: TAKE 1 TABLET(500 MG) BY MOUTH TWICE DAILY WITH A MEAL      Analgesics:  NSAIDS Passed - 12/20/2020 12:08 PM      Passed - Cr in normal range and within 360 days    Creatinine  Date Value Ref Range Status  04/14/2020 73.0 20.0 - 300.0 mg/dL Final   Creatinine, Ser  Date Value Ref Range Status  04/14/2020 0.91 0.57 - 1.00 mg/dL Final          Passed - HGB in normal range and within 360 days    Hemoglobin  Date Value Ref Range Status  04/14/2020 12.3 11.1 - 15.9 g/dL Final          Passed - Patient is not pregnant      Passed - Valid encounter within last 12 months    Recent Outpatient Visits           3 weeks ago Seasonal allergic rhinitis due to pollen   [facility], [place] T, NP   4 months ago Primary hypertension   [employer], Megan P, DO   6 months ago Acute midline low back pain with left-sided sciatica   [place], Henrine Screws T, NP   8 months ago Essential hypertension   [employer], [place], DO   9 months ago Essential hypertension   [place], [place], DO       Future Appointments             Today [place], Megan P, DO [place], [place] 250-50 MCG/DOSE AEPB [Pharmacy Med Name: WIXELA INHUB DISKUS 250/50MCG 60S] 180 each 3    Sig: INHALE 1  PUFF INTO THE LUNGS TWICE DAILY      Pulmonology:  [employer] Passed - 12/20/2020 12:08 PM      Passed - Valid encounter within last 12 months    Recent Outpatient Visits           3 weeks ago Seasonal allergic rhinitis due to pollen   [facility], [place] T, NP   4 months ago Primary hypertension   [facility] [place], Megan P, DO   6 months ago Acute midline low back pain with left-sided sciatica   [facility] [place], Barbaraann Faster, NP   8 months ago Essential hypertension   [place], [place], DO   9 months ago Essential hypertension   [place], [place], DO       Future Appointments             Today Wynetta Emery, Barb Merino, DO [place], [facility]

## 2020-12-20 NOTE — Telephone Encounter (Signed)
Pt has apt on 12/20/2020

## 2020-12-21 ENCOUNTER — Telehealth: Payer: Self-pay

## 2020-12-21 LAB — CBC WITH DIFFERENTIAL/PLATELET
Basophils Absolute: 0.1 10*3/uL (ref 0.0–0.2)
Basos: 1 %
EOS (ABSOLUTE): 0 10*3/uL (ref 0.0–0.4)
Eos: 0 %
Hematocrit: 38.1 % (ref 34.0–46.6)
Hemoglobin: 13.6 g/dL (ref 11.1–15.9)
Immature Grans (Abs): 0 10*3/uL (ref 0.0–0.1)
Immature Granulocytes: 0 %
Lymphocytes Absolute: 1.6 10*3/uL (ref 0.7–3.1)
Lymphs: 20 %
MCH: 34.8 pg — ABNORMAL HIGH (ref 26.6–33.0)
MCHC: 35.7 g/dL (ref 31.5–35.7)
MCV: 97 fL (ref 79–97)
Monocytes Absolute: 0.5 10*3/uL (ref 0.1–0.9)
Monocytes: 7 %
Neutrophils Absolute: 5.9 10*3/uL (ref 1.4–7.0)
Neutrophils: 72 %
Platelets: 267 10*3/uL (ref 150–450)
RBC: 3.91 x10E6/uL (ref 3.77–5.28)
RDW: 13.8 % (ref 11.7–15.4)
WBC: 8.1 10*3/uL (ref 3.4–10.8)

## 2020-12-21 LAB — LIPID PANEL W/O CHOL/HDL RATIO
Cholesterol, Total: 216 mg/dL — ABNORMAL HIGH (ref 100–199)
HDL: 128 mg/dL (ref >39)
LDL Chol Calc (NIH): 78 mg/dL (ref 0–99)
Triglycerides: 53 mg/dL (ref 0–149)
VLDL Cholesterol Cal: 10 mg/dL (ref 5–40)

## 2020-12-21 LAB — COMPREHENSIVE METABOLIC PANEL
ALT: 9 IU/L (ref 0–32)
AST: 27 IU/L (ref 0–40)
Albumin/Globulin Ratio: 1.7 (ref 1.2–2.2)
Albumin: 4.8 g/dL (ref 3.8–4.9)
Alkaline Phosphatase: 186 IU/L — ABNORMAL HIGH (ref 44–121)
BUN/Creatinine Ratio: 7 — ABNORMAL LOW (ref 9–23)
BUN: 7 mg/dL (ref 6–24)
Bilirubin Total: 0.4 mg/dL (ref 0.0–1.2)
CO2: 19 mmol/L — ABNORMAL LOW (ref 20–29)
Calcium: 9.4 mg/dL (ref 8.7–10.2)
Chloride: 83 mmol/L — ABNORMAL LOW (ref 96–106)
Creatinine, Ser: 0.96 mg/dL (ref 0.57–1.00)
Globulin, Total: 2.9 g/dL (ref 1.5–4.5)
Glucose: 72 mg/dL (ref 65–99)
Potassium: 5.1 mmol/L (ref 3.5–5.2)
Sodium: 122 mmol/L — ABNORMAL LOW (ref 134–144)
Total Protein: 7.7 g/dL (ref 6.0–8.5)
eGFR: 69 mL/min/{1.73_m2} (ref >59)

## 2020-12-21 LAB — TSH: TSH: 1.04 u[IU]/mL (ref 0.450–4.500)

## 2020-12-21 NOTE — Telephone Encounter (Signed)
PA for QUEtiapine Fumarate 25MG  tablets started. PA is approved.  Key: [id number]

## 2020-12-23 ENCOUNTER — Other Ambulatory Visit: Payer: Self-pay | Admitting: Family Medicine

## 2020-12-23 DIAGNOSIS — E871 Hypo-osmolality and hyponatremia: Secondary | ICD-10-CM

## 2021-01-18 DIAGNOSIS — Z716 Tobacco abuse counseling: Secondary | ICD-10-CM | POA: Diagnosis not present

## 2021-01-18 DIAGNOSIS — Z79899 Other long term (current) drug therapy: Secondary | ICD-10-CM | POA: Diagnosis not present

## 2021-01-18 DIAGNOSIS — R809 Proteinuria, unspecified: Secondary | ICD-10-CM | POA: Diagnosis not present

## 2021-01-18 DIAGNOSIS — I129 Hypertensive chronic kidney disease with stage 1 through stage 4 chronic kidney disease, or unspecified chronic kidney disease: Secondary | ICD-10-CM | POA: Diagnosis not present

## 2021-01-18 DIAGNOSIS — E872 Acidosis: Secondary | ICD-10-CM | POA: Diagnosis not present

## 2021-01-18 DIAGNOSIS — N182 Chronic kidney disease, stage 2 (mild): Secondary | ICD-10-CM | POA: Diagnosis not present

## 2021-01-18 DIAGNOSIS — E871 Hypo-osmolality and hyponatremia: Secondary | ICD-10-CM | POA: Diagnosis not present

## 2021-01-24 DIAGNOSIS — M5416 Radiculopathy, lumbar region: Secondary | ICD-10-CM | POA: Diagnosis not present

## 2021-01-27 ENCOUNTER — Other Ambulatory Visit: Payer: Self-pay | Admitting: Family Medicine

## 2021-02-01 ENCOUNTER — Ambulatory Visit (HOSPITAL_COMMUNITY)
Admission: RE | Admit: 2021-02-01 | Discharge: 2021-02-01 | Disposition: A | Discharge status: Home / Self Care | Payer: Medicaid Other | Source: Ambulatory Visit | Attending: Nephrology | Admitting: Nephrology

## 2021-02-01 ENCOUNTER — Other Ambulatory Visit (HOSPITAL_COMMUNITY): Payer: Self-pay | Admitting: Nephrology

## 2021-02-01 ENCOUNTER — Other Ambulatory Visit: Payer: Self-pay

## 2021-02-01 DIAGNOSIS — E872 Acidosis: Secondary | ICD-10-CM | POA: Diagnosis not present

## 2021-02-01 DIAGNOSIS — E871 Hypo-osmolality and hyponatremia: Secondary | ICD-10-CM | POA: Diagnosis not present

## 2021-02-01 DIAGNOSIS — Z79899 Other long term (current) drug therapy: Secondary | ICD-10-CM | POA: Diagnosis not present

## 2021-02-01 DIAGNOSIS — R809 Proteinuria, unspecified: Secondary | ICD-10-CM | POA: Diagnosis not present

## 2021-02-01 DIAGNOSIS — N182 Chronic kidney disease, stage 2 (mild): Secondary | ICD-10-CM | POA: Diagnosis not present

## 2021-02-01 DIAGNOSIS — I129 Hypertensive chronic kidney disease with stage 1 through stage 4 chronic kidney disease, or unspecified chronic kidney disease: Secondary | ICD-10-CM | POA: Diagnosis not present

## 2021-02-01 DIAGNOSIS — Z716 Tobacco abuse counseling: Secondary | ICD-10-CM | POA: Diagnosis not present

## 2021-02-16 DIAGNOSIS — M4180 Other forms of scoliosis, site unspecified: Secondary | ICD-10-CM | POA: Diagnosis not present

## 2021-03-10 ENCOUNTER — Other Ambulatory Visit: Payer: Self-pay | Admitting: Family Medicine

## 2021-03-10 DIAGNOSIS — I129 Hypertensive chronic kidney disease with stage 1 through stage 4 chronic kidney disease, or unspecified chronic kidney disease: Secondary | ICD-10-CM | POA: Diagnosis not present

## 2021-03-10 DIAGNOSIS — E871 Hypo-osmolality and hyponatremia: Secondary | ICD-10-CM | POA: Diagnosis not present

## 2021-03-10 DIAGNOSIS — E222 Syndrome of inappropriate secretion of antidiuretic hormone: Secondary | ICD-10-CM | POA: Diagnosis not present

## 2021-03-10 DIAGNOSIS — E559 Vitamin D deficiency, unspecified: Secondary | ICD-10-CM | POA: Diagnosis not present

## 2021-03-10 DIAGNOSIS — R809 Proteinuria, unspecified: Secondary | ICD-10-CM | POA: Diagnosis not present

## 2021-03-10 DIAGNOSIS — N1831 Chronic kidney disease, stage 3a: Secondary | ICD-10-CM | POA: Diagnosis not present

## 2021-03-10 NOTE — Telephone Encounter (Signed)
   Notes to clinic:  One inhaler should last at least one month. If the patient is requesting refills earlier, contact the patient to check for uncontrolled symptoms   Requested Prescriptions  Pending Prescriptions Disp Refills   PROAIR HFA 108 (90 Base) MCG/ACT inhaler [Pharmacy Med Name: PROAIR HFA ORAL INH (200  PFS) 8.5G] 8.5 g 2    Sig: INHALE 2 PUFFS BY MOUTH EVERY 6 HOURS AS NEEDED FOR WHEEZING OR SHORTNESS OF BREATH      Pulmonology:  [employer] Failed - 03/10/2021 10:40 AM      Failed - One inhaler should last at least one month. If the patient is requesting refills earlier, contact the patient to check for uncontrolled symptoms.      Passed - Valid encounter within last 12 months    Recent Outpatient Visits           2 months ago Primary hypertension   [place], Megan P, DO   3 months ago Seasonal allergic rhinitis due to pollen   [facility], [place] T, NP   7 months ago Primary hypertension   [facility] [place], Megan P, DO   9 months ago Acute midline low back pain with left-sided sciatica   [facility] [place], Barbaraann Faster, NP   11 months ago Essential hypertension   [place], [place], DO       Future Appointments             In 1 week Wynetta Emery, Barb Merino, DO [employer], [facility]

## 2021-03-13 NOTE — Telephone Encounter (Signed)
Pt is scheduled 7/19 

## 2021-03-20 DIAGNOSIS — M5416 Radiculopathy, lumbar region: Secondary | ICD-10-CM | POA: Diagnosis not present

## 2021-03-21 ENCOUNTER — Ambulatory Visit: Payer: Medicaid Other | Admitting: Family Medicine

## 2021-03-21 ENCOUNTER — Other Ambulatory Visit: Payer: Self-pay

## 2021-03-21 ENCOUNTER — Encounter: Payer: Self-pay | Admitting: Family Medicine

## 2021-03-21 VITALS — BP 134/78 | HR 65 | Temp 98.6°F | Ht 61.0 in | Wt 125.4 lb

## 2021-03-21 DIAGNOSIS — F419 Anxiety disorder, unspecified: Secondary | ICD-10-CM | POA: Diagnosis not present

## 2021-03-21 MED ORDER — ESCITALOPRAM OXALATE 5 MG PO TABS: 5.0000 mg | ORAL_TABLET | Freq: Every day | ORAL | 1 refills | Status: DC

## 2021-03-21 MED ORDER — LORAZEPAM 0.5 MG PO TABS: 0.5000 mg | ORAL_TABLET | Freq: Two times a day (BID) | ORAL | 2 refills | Status: DC | PRN

## 2021-03-21 NOTE — Progress Notes (Signed)
BP 134/78   Pulse 65   Temp 98.6 F (37 C) (Oral)   Ht 5' 1" (1.549 m)   Wt 125 lb 6.4 oz (56.9 kg)   LMP 06/17/2011   SpO2 97%   BMI 23.69 kg/m    Subjective:    Patient ID: Jill Poole, female    DOB: 04-07-1961, 60 y.o.   MRN: [id number]  HPI: Jill Poole is a 60 y.o. female  Chief Complaint  Patient presents with   Depression   ANXIETY/DEPRESSION- stopped all her medicines because she was sick of taking them. Has continued ativan.  Duration: chronic Status:exacerbated Anxious mood: yes  Excessive worrying: no Irritability: no  Sweating: yes Nausea: no Palpitations:no Hyperventilation: no Panic attacks: no Agoraphobia: no  Obscessions/compulsions: no Depressed mood: no Depression screen [facility] 2/9 03/21/2021 12/20/2020 07/27/2020 04/14/2020 03/04/2020  Decreased Interest 1 3 - 0 0  Down, Depressed, Hopeless 0 2 1 0 0  PHQ - 2 Score _0 0 0  Altered sleeping _1 0  Tired, decreased energy 0 3 - 3 0  Change in appetite _2 0 0  Feeling bad or failure about yourself  0 0 0 0 0  Trouble concentrating 0 0 0 0 0  Moving slowly or fidgety/restless 0 0 0 0 3  Suicidal thoughts 0 0 0 0 0  PHQ-9 Score _3 Difficult doing work/chores Very difficult Somewhat difficult - Not difficult at all Not difficult at all  Some recent data might be hidden   Anhedonia: no Weight changes: no Insomnia: no   Hypersomnia: no Fatigue/loss of energy: yes Feelings of worthlessness: no Feelings of guilt: no Impaired concentration/indecisiveness: no Suicidal ideations: no  Crying spells: no Recent Stressors/Life Changes: yes   Relationship problems: no   Family stress: no     Financial stress: no    Job stress: no    Recent death/loss: no  Relevant past medical, surgical, family and social history reviewed and updated as indicated. Interim medical history since our last visit reviewed. Allergies and medications reviewed and updated.  Review of Systems   Constitutional: Negative.   Respiratory: Negative.    Cardiovascular: Negative.   Gastrointestinal: Negative.   Musculoskeletal:  Positive for back pain and myalgias. Negative for arthralgias, gait problem, joint swelling, neck pain and neck stiffness.  Skin: Negative.   Psychiatric/Behavioral:  Positive for dysphoric mood. Negative for agitation, behavioral problems, confusion, decreased concentration, hallucinations, self-injury, sleep disturbance and suicidal ideas. The patient is nervous/anxious. The patient is not hyperactive.    Per HPI unless specifically indicated above     Objective:    BP 134/78   Pulse 65   Temp 98.6 F (37 C) (Oral)   Ht 5' 1" (1.549 m)   Wt 125 lb 6.4 oz (56.9 kg)   LMP 06/17/2011   SpO2 97%   BMI 23.69 kg/m   Wt Readings from Last 3 Encounters:  03/21/21 125 lb 6.4 oz (56.9 kg)  12/20/20 122 lb 6.4 oz (55.5 kg)  11/24/20 120 lb (54.4 kg)    Physical Exam Vitals and nursing note reviewed.  Constitutional:      General: She is not in acute distress.    Appearance: Normal appearance. She is not ill-appearing, toxic-appearing or diaphoretic.  HENT:     Head: Normocephalic and atraumatic.     Right Ear: External ear normal.     Left Ear: External ear normal.  Nose: Nose normal.     Mouth/Throat:     Mouth: Mucous membranes are moist.     Pharynx: Oropharynx is clear.  Eyes:     General: No scleral icterus.       Right eye: No discharge.        Left eye: No discharge.     Extraocular Movements: Extraocular movements intact.     Conjunctiva/sclera: Conjunctivae normal.     Pupils: Pupils are equal, round, and reactive to light.  Cardiovascular:     Rate and Rhythm: Normal rate and regular rhythm.     Pulses: Normal pulses.     Heart sounds: Normal heart sounds. No murmur heard.   No friction rub. No gallop.  Pulmonary:     Effort: Pulmonary effort is normal. No respiratory distress.     Breath sounds: Normal breath sounds. No  stridor. No wheezing, rhonchi or rales.  Chest:     Chest wall: No tenderness.  Musculoskeletal:        General: Normal range of motion.     Cervical back: Normal range of motion and neck supple.  Skin:    General: Skin is warm and dry.     Capillary Refill: Capillary refill takes less than 2 seconds.     Coloration: Skin is not jaundiced or pale.     Findings: No bruising, erythema, lesion or rash.  Neurological:     General: No focal deficit present.     Mental Status: She is alert and oriented to person, place, and time. Mental status is at baseline.  Psychiatric:        Mood and Affect: Mood normal.        Behavior: Behavior normal.        Thought Content: Thought content normal.        Judgment: Judgment normal.    Results for orders placed or performed in visit on 12/20/20  Microscopic Examination   Urine  Result Value Ref Range   WBC, UA None seen 0 - 5 /hpf   RBC 0-2 0 - 2 /hpf   Epithelial Cells (non renal) 0-10 0 - 10 /hpf   Bacteria, UA Few (A) None seen/Few  CBC with Differential/Platelet  Result Value Ref Range   WBC 8.1 3.4 - 10.8 x10E3/uL   RBC 3.91 3.77 - 5.28 x10E6/uL   Hemoglobin 13.6 11.1 - 15.9 g/dL   Hematocrit 38.1 34.0 - 46.6 %   MCV 97 79 - 97 fL   MCH 34.8 (H) 26.6 - 33.0 pg   MCHC 35.7 31.5 - 35.7 g/dL   RDW 13.8 11.7 - 15.4 %   Platelets 267 150 - 450 x10E3/uL   Neutrophils 72 Not Estab. %   Lymphs 20 Not Estab. %   Monocytes 7 Not Estab. %   Eos 0 Not Estab. %   Basos 1 Not Estab. %   Neutrophils Absolute 5.9 1.4 - 7.0 x10E3/uL   Lymphocytes Absolute 1.6 0.7 - 3.1 x10E3/uL   Monocytes Absolute 0.5 0.1 - 0.9 x10E3/uL   EOS (ABSOLUTE) 0.0 0.0 - 0.4 x10E3/uL   Basophils Absolute 0.1 0.0 - 0.2 x10E3/uL   Immature Granulocytes 0 Not Estab. %   Immature Grans (Abs) 0.0 0.0 - 0.1 x10E3/uL  Comprehensive metabolic panel  Result Value Ref Range   Glucose 72 65 - 99 mg/dL   BUN 7 6 - 24 mg/dL   Creatinine, Ser 0.96 0.57 - 1.00 mg/dL   eGFR 69  >59 mL/min/1.73  BUN/Creatinine Ratio 7 (L) 9 - 23   Sodium 122 (L) 134 - 144 mmol/L   Potassium 5.1 3.5 - 5.2 mmol/L   Chloride 83 (L) 96 - 106 mmol/L   CO2 19 (L) 20 - 29 mmol/L   Calcium 9.4 8.7 - 10.2 mg/dL   Total Protein 7.7 6.0 - 8.5 g/dL   Albumin 4.8 3.8 - 4.9 g/dL   Globulin, Total 2.9 1.5 - 4.5 g/dL   Albumin/Globulin Ratio 1.7 1.2 - 2.2   Bilirubin Total 0.4 0.0 - 1.2 mg/dL   Alkaline Phosphatase 186 (H) 44 - 121 IU/L   AST 27 0 - 40 IU/L   ALT 9 0 - 32 IU/L  Lipid Panel w/o Chol/HDL Ratio  Result Value Ref Range   Cholesterol, Total 216 (H) 100 - 199 mg/dL   Triglycerides 53 0 - 149 mg/dL   HDL 128 >39 mg/dL   VLDL Cholesterol Cal 10 5 - 40 mg/dL   LDL Chol Calc (NIH) 78 0 - 99 mg/dL  Microalbumin, Urine Waived  Result Value Ref Range   Microalb, Ur Waived 30 (H) 0 - 19 mg/L   Creatinine, Urine Waived 50 10 - 300 mg/dL   Microalb/Creat Ratio 30-300 (H) <30 mg/g  TSH  Result Value Ref Range   TSH 1.040 0.450 - 4.500 uIU/mL  Urinalysis, Routine w reflex microscopic  Result Value Ref Range   Specific Gravity, UA 1.010 1.005 - 1.030   pH, UA 6.0 5.0 - 7.5   Color, UA Yellow Yellow   Appearance Ur Clear Clear   Leukocytes,UA Negative Negative   Protein,UA Negative Negative/Trace   Glucose, UA Negative Negative   Ketones, UA Negative Negative   RBC, UA Trace (A) Negative   Bilirubin, UA Negative Negative   Urobilinogen, Ur 0.2 0.2 - 1.0 mg/dL   Nitrite, UA Negative Negative   Microscopic Examination See below:       Assessment & Plan:   Problem List Items Addressed This Visit       Other   Anxiety - Primary    Will restart her lexapro at lower dose. Continue to monitor. Continue PRN ativan. Refills given today for 3 months. Should last 3 months. Call with any concerns. Follow up 3 months.        Relevant Medications   escitalopram (LEXAPRO) 5 MG tablet   LORazepam (ATIVAN) 0.5 MG tablet     Follow up plan: Return in about 3 months (around  06/21/2021) for physcial.

## 2021-03-21 NOTE — Assessment & Plan Note (Signed)
Will restart her lexapro at lower dose. Continue to monitor. Continue PRN ativan. Refills given today for 3 months. Should last 3 months. Call with any concerns. Follow up 3 months.

## 2021-04-03 DIAGNOSIS — M5416 Radiculopathy, lumbar region: Secondary | ICD-10-CM | POA: Diagnosis not present

## 2021-04-03 DIAGNOSIS — M4180 Other forms of scoliosis, site unspecified: Secondary | ICD-10-CM | POA: Diagnosis not present

## 2021-04-17 ENCOUNTER — Encounter (INDEPENDENT_AMBULATORY_CARE_PROVIDER_SITE_OTHER): Payer: Self-pay | Admitting: *Deleted

## 2021-04-19 ENCOUNTER — Ambulatory Visit: Payer: Self-pay | Admitting: *Deleted

## 2021-04-19 NOTE — Telephone Encounter (Signed)
I returned pt's call.   She had called in c/o having a tickle in her throat and difficulty sleeping due to coughing a lot at night. Her roommate has been sick with a upper respiratory infection.   "I accidentally drank from her water glass and I think that's why I'm sick now".    She tested for Covid 2 days ago and it was negative.   I asked if she had tested again since and she said, "no".   I encouraged her to retest because she may have tested too early.   She said she had a home test.   She is c/o an earache in both ears, coughing up yellow-green mucus and post nasal drip real bad.   She is coughing bad at night.   No fever, diarrhea or vomiting or body aches.   She mentioned her throat was sore and the glands under her jaw were swollen.   I asked if she was taking any OTC medications for the cough and congestion and she said,  "No".   I made some suggestions of OTC medications she could use;  Mucinix DM, Robitussin cough medication, Nyquil.   She said she had Day time and night time Nyquil there but she hasn't taken any of it.  There were no available appts with [facility] within the 24 hr timeframe indicated on the protocol.   I offered to make her a virtual visit.    I let her know about the video and E visit options available via MyChart.   I asked if she had a MyChart account.    "I don't trust computers".   I suggested she go to an urgent care near her and she said,   "I don't have a way there today".   I asked if she had family or friends that could take her.   "They all don't have time.".   She went into detail why several people could not take her to the urgent care.   At this point I recommended she try the Nyquil she has available for her symptoms since she doesn't trust using the computer, doesn't have a way to a dr's appt or anyone to take her to an urgent care.   I encouraged her to see what she could work out with someone this afternoon.   She had mentioned maybe someone could  take her to the urgent care this afternoon.    She thanked me for my help.  I sent my notes to [facility] .

## 2021-04-19 NOTE — Telephone Encounter (Signed)
Reason for Disposition  Earache  Answer Assessment - Initial Assessment Questions 1. ONSET: "When did the nasal discharge start?"      My roommate and I went on a walk.    She's been sick.   We got our water cups mixed up.   I've got her respiratory infection.    Her dr said she had a upper respiratory  infection.   2. AMOUNT: "How much discharge is there?"      Coughing up yellow-green mucus, no runny nose.   Glands are swollen under my jaw.   I don't have any energy.    My Covid test was negative day before yesterday.   I'm having these  symptoms for a week.   No diarrhea or vomiting.   My ears are hurting.    3. COUGH: "Do you have a cough?" If yes, ask: "Describe the color of your sputum" (clear, white, yellow, green)     Just coughing up green yellow mucus.    Coughing a lot.   OTC cough medications have not helped.    4. RESPIRATORY DISTRESS: "Describe your breathing."      No 5. FEVER: "Do you have a fever?" If Yes, ask: "What is your temperature, how was it measured, and when did it start?"     No fever 6. SEVERITY: "Overall, how bad are you feeling right now?" (e.g., doesn't interfere with normal activities, staying home from school/work, staying in bed)      Bad 7. OTHER SYMPTOMS: "Do you have any other symptoms?" (e.g., sore throat, earache, wheezing, vomiting)     My throat is sore and my ears are hurting.   8. PREGNANCY: "Is there any chance you are pregnant?" "When was your last menstrual period?"     N/A  Protocols used: Common Cold-A-AH

## 2021-04-19 NOTE — Telephone Encounter (Signed)
Routing to provider to advise on patient's message.

## 2021-04-27 ENCOUNTER — Telehealth: Payer: Self-pay | Admitting: Family Medicine

## 2021-04-27 NOTE — Telephone Encounter (Signed)
Requested medications are due for refill today.  unknown  Requested medications are on the active medications list.  no  Last refill. 04/14/2020  Future visit scheduled.   yes  Notes to clinic.  Medication was discontinued as the dosage was changed.

## 2021-05-04 ENCOUNTER — Telehealth: Payer: Self-pay | Admitting: Family Medicine

## 2021-05-04 ENCOUNTER — Ambulatory Visit: Payer: Self-pay | Admitting: *Deleted

## 2021-05-04 NOTE — Telephone Encounter (Signed)
Called and left a message for patient to return my call.  

## 2021-05-04 NOTE — Telephone Encounter (Signed)
Pt called about the refill request for lisinopril / pt states she still has pills left and doesn't need refill at this time but her pharmacy keeps telling her she is due for refills/ please advise

## 2021-05-04 NOTE — Telephone Encounter (Signed)
I advise her to go to the ER

## 2021-05-04 NOTE — Telephone Encounter (Signed)
Routing to provider to advise.  

## 2021-05-04 NOTE — Telephone Encounter (Signed)
See triage encounter for this call back.

## 2021-05-04 NOTE — Telephone Encounter (Signed)
Patient is calling to report she has been released form spinal specialist due to last option surgery- and she declines. Patient is scared of surgery. Patient states she is in sever pain- she has been vomiting from pain for 1 week and has lost her appetite. Patient states she has begun to have increase incontinence. Patient is having move frequent falls due to weakness in legs- and is having a hard time using her R foot/leg. Patient declines protocol -ED. Patient is requesting any other options from PCP- she is requesting help with her pain and nausea. Patient offered appointment- but she states to send message first for provider review.

## 2021-05-04 NOTE — Telephone Encounter (Signed)
Patient states she has stopped seeing- orthopedic- spine specialist- patient tried injections- and didn't help. She was offered surgery- but she declined-she was told never to let anyone do surgery on her back. Patient is in severe pain  Reason for Disposition  Weakness of a leg or foot (e.g., unable to bear weight, dragging foot)    Patient has chronic nerve damage- she was under care of spinal specialist- but after recommendation of surgery- she declined and has been released  Answer Assessment - Initial Assessment Questions 1. ONSET: "When did the pain begin?"      Chronic pain- ongoing 2. LOCATION: "Where does it hurt?" (upper, mid or lower back)     Back and neck pain 3. SEVERITY: "How bad is the pain?"  (e.g., Scale 1-10; mild, moderate, or severe)   - MILD (1-3): doesn't interfere with normal activities    - MODERATE (4-7): interferes with normal activities or awakens from sleep    - SEVERE (8-10): excruciating pain, unable to do any normal activities      Moderate/severe 4. PATTERN: "Is the pain constant?" (e.g., yes, no; constant, intermittent)      constant 5. RADIATION: "Does the pain shoot into your legs or elsewhere?"     Pain in neck/back- patient does have numbness in legs and weaken R leg. She reports frequent falls due to weakness in legs 6. 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 BACK OVERUSE:  "Any recent lifting of heavy objects, strenuous work or exercise?"     N/a 8. MEDICATIONS: "What have you taken so far for the pain?" (e.g., nothing, acetaminophen, NSAIDS)     naproxen 9. NEUROLOGIC SYMPTOMS: "Do you have any weakness, numbness, or problems with bowel/bladder control?"     Weakness, numbness, increase bladder incontinence recently 10. OTHER SYMPTOMS: "Do you have any other symptoms?" (e.g., fever, abdominal pain, burning with urination, blood in urine)       Vomiting,loss of  appetite due to pain  11. PREGNANCY: "Is there any chance you are pregnant?" (e.g., yes, no; LMP)       N/  Protocols used: Back Pain-A-AH

## 2021-05-04 NOTE — Telephone Encounter (Signed)
Patient returning call back to nurse about her back pain. Please call back

## 2021-05-04 NOTE — Telephone Encounter (Signed)
Patient returned call in another encounter. Patient called and advised of the message below from Dr. Wynetta Emery to go to the ER. Patient says she doesn't want to do that she will be in there all day. She then says ok and thank you.

## 2021-06-19 ENCOUNTER — Other Ambulatory Visit: Payer: Self-pay | Admitting: Family Medicine

## 2021-06-19 NOTE — Telephone Encounter (Signed)
Requested medications are due for refill today.  Unknown  Requested medications are on the active medications list.  no  Last refill. 12/20/2020  Future visit scheduled.   no  Notes to clinic.  This medication was discontinued 03/21/2021.

## 2021-06-21 ENCOUNTER — Ambulatory Visit: Payer: Medicaid Other | Admitting: Family Medicine

## 2021-06-28 DIAGNOSIS — Z79899 Other long term (current) drug therapy: Secondary | ICD-10-CM | POA: Diagnosis not present

## 2021-06-28 DIAGNOSIS — I129 Hypertensive chronic kidney disease with stage 1 through stage 4 chronic kidney disease, or unspecified chronic kidney disease: Secondary | ICD-10-CM | POA: Diagnosis not present

## 2021-06-28 DIAGNOSIS — N182 Chronic kidney disease, stage 2 (mild): Secondary | ICD-10-CM | POA: Diagnosis not present

## 2021-06-28 DIAGNOSIS — E871 Hypo-osmolality and hyponatremia: Secondary | ICD-10-CM | POA: Diagnosis not present

## 2021-06-28 DIAGNOSIS — E222 Syndrome of inappropriate secretion of antidiuretic hormone: Secondary | ICD-10-CM | POA: Diagnosis not present

## 2021-06-28 DIAGNOSIS — R809 Proteinuria, unspecified: Secondary | ICD-10-CM | POA: Diagnosis not present

## 2021-06-28 DIAGNOSIS — N1831 Chronic kidney disease, stage 3a: Secondary | ICD-10-CM | POA: Diagnosis not present

## 2021-07-06 DIAGNOSIS — E222 Syndrome of inappropriate secretion of antidiuretic hormone: Secondary | ICD-10-CM | POA: Diagnosis not present

## 2021-07-06 DIAGNOSIS — E559 Vitamin D deficiency, unspecified: Secondary | ICD-10-CM | POA: Diagnosis not present

## 2021-07-06 DIAGNOSIS — B3749 Other urogenital candidiasis: Secondary | ICD-10-CM | POA: Diagnosis not present

## 2021-07-06 DIAGNOSIS — I129 Hypertensive chronic kidney disease with stage 1 through stage 4 chronic kidney disease, or unspecified chronic kidney disease: Secondary | ICD-10-CM | POA: Diagnosis not present

## 2021-07-06 DIAGNOSIS — E871 Hypo-osmolality and hyponatremia: Secondary | ICD-10-CM | POA: Diagnosis not present

## 2021-07-06 DIAGNOSIS — N182 Chronic kidney disease, stage 2 (mild): Secondary | ICD-10-CM | POA: Diagnosis not present

## 2021-07-26 ENCOUNTER — Telehealth: Payer: Self-pay | Admitting: Family Medicine

## 2021-07-26 NOTE — Telephone Encounter (Signed)
Rx 03/21/21 #90 1RF (6 month supply) Requested Prescriptions  Pending Prescriptions Disp Refills  . escitalopram (LEXAPRO) 20 MG tablet [Pharmacy Med Name: ESCITALOPRAM 20MG  TABLETS] 90 tablet 1    Sig: TAKE 1 TABLET(20 MG) BY MOUTH DAILY     Psychiatry:  Antidepressants - SSRI Passed - 07/26/2021 11:10 AM      Passed - Completed PHQ-2 or PHQ-9 in the last 360 days      Passed - Valid encounter within last 6 months    Recent Outpatient Visits          4 months ago [place], Megan P, DO   7 months ago Primary hypertension   [place], Megan P, DO   8 months ago Seasonal allergic rhinitis due to pollen   [facility], Barbaraann Faster, NP   12 months ago Primary hypertension   [employer], Megan P, DO   1 year ago Acute midline low back pain with left-sided sciatica   [facility] Venita Lick, NP

## 2021-08-02 ENCOUNTER — Other Ambulatory Visit: Payer: Self-pay | Admitting: Family Medicine

## 2021-08-02 NOTE — Telephone Encounter (Signed)
Medication Refill - Medication: Lisinopril,Lorazepam,Naproxen,Albuterol  Has the patient contacted their pharmacy? Yes.   Pt states the pharmacy is needing new prescriptions to fill this for her.  (Agent: If yes, when and what did the pharmacy advise?)  Preferred Pharmacy (with phone number or street name):  [facility] [phone] - [place], [place] [address]  [address] [place] [address]  Phone: [phone] Fax: [phone]   Has the patient been seen for an appointment in the last year OR does the patient have an upcoming appointment? Yes.   09/12/2021  Agent: Please be advised that RX refills may take up to 3 business days. We ask that you follow-up with your pharmacy.

## 2021-08-03 MED ORDER — NAPROXEN 500 MG PO TABS: 500.0000 mg | ORAL_TABLET | Freq: Two times a day (BID) | ORAL | 1 refills | Status: DC

## 2021-08-03 MED ORDER — ALBUTEROL SULFATE HFA 108 (90 BASE) MCG/ACT IN AERS: INHALATION_SPRAY | RESPIRATORY_TRACT | 0 refills | Status: DC

## 2021-08-03 MED ORDER — LISINOPRIL 30 MG PO TABS: 30.0000 mg | ORAL_TABLET | Freq: Every day | ORAL | 1 refills | Status: DC

## 2021-08-04 ENCOUNTER — Other Ambulatory Visit: Payer: Self-pay | Admitting: Nurse Practitioner

## 2021-08-05 NOTE — Telephone Encounter (Signed)
  Requested medication (s) are due for refill today: NO  Requested medication (s) are on the active medication list: NO  Last refill:  07/04/21  Future visit scheduled: 09/12/21  Notes to clinic:  med not on current med list, please assess.    Requested Prescriptions  Pending Prescriptions Disp Refills   fluticasone (FLONASE) 50 MCG/ACT nasal spray [Pharmacy Med Name: FLUTICASONE 50MCG NAS SP(120SP) RX] 16 g 6    Sig: SHAKE LIQUID AND USE 2 SPRAYS IN EACH NOSTRIL DAILY     Ear, Nose, and Throat: Nasal Preparations - Corticosteroids Passed - 08/04/2021 10:28 AM      Passed - Valid encounter within last 12 months    Recent Outpatient Visits           4 months ago [place], Megan P, DO   7 months ago Primary hypertension   [facility] [place], Megan P, DO   8 months ago Seasonal allergic rhinitis due to pollen   [facility], Barbaraann Faster, NP   1 year ago Primary hypertension   [place] P, DO   1 year ago Acute midline low back pain with left-sided sciatica   [facility] Venita Lick, NP       Future Appointments             In 1 month [place], Barb Merino, DO [employer], [facility]

## 2021-08-24 ENCOUNTER — Other Ambulatory Visit: Payer: Self-pay | Admitting: Family Medicine

## 2021-08-24 NOTE — Telephone Encounter (Signed)
Requested Prescriptions  Pending Prescriptions Disp Refills   albuterol (VENTOLIN HFA) 108 (90 Base) MCG/ACT inhaler [Pharmacy Med Name: VENTOLIN HFA INH W/DOS CTR 200PUFFS] 18 g 3    Sig: INHALE 2 PUFFS BY MOUTH EVERY 6 HOURS AS NEEDED FOR WHEEZING OR SHORTNESS OF BREATH     Pulmonology:  [facility] [employer]ists Failed - 08/24/2021 10:42 AM      Failed - One inhaler should last at least one month. If the patient is requesting refills earlier, contact the patient to check for uncontrolled symptoms.      Passed - Valid encounter within last 12 months    Recent Outpatient Visits          5 months ago [place], [place], DO   8 months ago Primary hypertension   [facility] [place], Megan P, DO   9 months ago Seasonal allergic rhinitis due to pollen   [facility], Barbaraann Faster, NP   1 year ago Primary hypertension   [place] P, DO   1 year ago Acute midline low back pain with left-sided sciatica   [facility] Venita Lick, NP      Future Appointments            In 2 weeks Wynetta Emery, Barb Merino, DO [facility], [facility]

## 2021-09-12 ENCOUNTER — Encounter: Payer: Self-pay | Admitting: Family Medicine

## 2021-09-12 ENCOUNTER — Ambulatory Visit (INDEPENDENT_AMBULATORY_CARE_PROVIDER_SITE_OTHER): Payer: Medicaid Other | Admitting: Family Medicine

## 2021-09-12 ENCOUNTER — Telehealth: Payer: Self-pay | Admitting: Family Medicine

## 2021-09-12 ENCOUNTER — Other Ambulatory Visit: Payer: Self-pay

## 2021-09-12 VITALS — BP 94/59 | HR 99 | Temp 98.4°F | Ht 61.6 in | Wt 124.4 lb

## 2021-09-12 DIAGNOSIS — J441 Chronic obstructive pulmonary disease with (acute) exacerbation: Secondary | ICD-10-CM | POA: Diagnosis not present

## 2021-09-12 DIAGNOSIS — F419 Anxiety disorder, unspecified: Secondary | ICD-10-CM

## 2021-09-12 DIAGNOSIS — Z13220 Encounter for screening for lipoid disorders: Secondary | ICD-10-CM

## 2021-09-12 DIAGNOSIS — J449 Chronic obstructive pulmonary disease, unspecified: Secondary | ICD-10-CM

## 2021-09-12 DIAGNOSIS — Z23 Encounter for immunization: Secondary | ICD-10-CM

## 2021-09-12 DIAGNOSIS — I10 Essential (primary) hypertension: Secondary | ICD-10-CM | POA: Diagnosis not present

## 2021-09-12 DIAGNOSIS — K219 Gastro-esophageal reflux disease without esophagitis: Secondary | ICD-10-CM | POA: Diagnosis not present

## 2021-09-12 DIAGNOSIS — F331 Major depressive disorder, recurrent, moderate: Secondary | ICD-10-CM | POA: Diagnosis not present

## 2021-09-12 LAB — URINALYSIS, ROUTINE W REFLEX MICROSCOPIC
Bilirubin, UA: NEGATIVE
Glucose, UA: NEGATIVE
Ketones, UA: NEGATIVE
Nitrite, UA: NEGATIVE
Protein,UA: NEGATIVE
RBC, UA: NEGATIVE
Specific Gravity, UA: 1.01 (ref 1.005–1.030)
Urobilinogen, Ur: 0.2 mg/dL (ref 0.2–1.0)
pH, UA: 6.5 (ref 5.0–7.5)

## 2021-09-12 LAB — MICROALBUMIN, URINE WAIVED
Creatinine, Urine Waived: 50 mg/dL (ref 10–300)
Microalb, Ur Waived: 10 mg/L (ref 0–19)
Microalb/Creat Ratio: <30 mg/g (ref <30)

## 2021-09-12 LAB — MICROSCOPIC EXAMINATION: RBC, Urine: NONE SEEN /hpf (ref 0–2)

## 2021-09-12 MED ORDER — LORAZEPAM 0.5 MG PO TABS: 0.5000 mg | ORAL_TABLET | Freq: Two times a day (BID) | ORAL | 0 refills | Status: DC | PRN

## 2021-09-12 MED ORDER — OMEPRAZOLE 40 MG PO CPDR: 40.0000 mg | DELAYED_RELEASE_CAPSULE | Freq: Every day | ORAL | 1 refills | Status: DC

## 2021-09-12 MED ORDER — FLUTICASONE-SALMETEROL 250-50 MCG/ACT IN AEPB: 1.0000 | INHALATION_SPRAY | Freq: Two times a day (BID) | RESPIRATORY_TRACT | 12 refills | Status: AC

## 2021-09-12 MED ORDER — ALBUTEROL SULFATE (2.5 MG/3ML) 0.083% IN NEBU
2.5000 mg | INHALATION_SOLUTION | Freq: Once | RESPIRATORY_TRACT | Status: AC
  Administered 2021-09-12: 2.5 mg via RESPIRATORY_TRACT

## 2021-09-12 MED ORDER — ESCITALOPRAM OXALATE 10 MG PO TABS
10.0000 mg | ORAL_TABLET | Freq: Every day | ORAL | 1 refills | Status: DC
Start: 2021-09-12 — End: 2022-01-24

## 2021-09-12 MED ORDER — HYDROXYZINE HCL 25 MG PO TABS: 25.0000 mg | ORAL_TABLET | Freq: Three times a day (TID) | ORAL | 1 refills | Status: DC | PRN

## 2021-09-12 MED ORDER — LISINOPRIL 20 MG PO TABS
20.0000 mg | ORAL_TABLET | Freq: Every day | ORAL | 1 refills | Status: DC
Start: 2021-09-12 — End: 2022-01-24

## 2021-09-12 NOTE — Telephone Encounter (Signed)
Called pt to schedule appointment for physical.  Unable to leave voicemail.

## 2021-09-12 NOTE — Assessment & Plan Note (Signed)
Under good control on current regimen. Continue current regimen. Continue to monitor. Call with any concerns. Refills given. Labs drawn today.   

## 2021-09-12 NOTE — Assessment & Plan Note (Signed)
Over treated. Will decrease her lisinopril to 20mg  and recheck in a couple of weeks. Continue to monitor.

## 2021-09-12 NOTE — Assessment & Plan Note (Addendum)
Not under good control today. Continue current regimen. Continue to monitor. Call with any concerns. Refills given. Labs drawn today.

## 2021-09-13 ENCOUNTER — Encounter: Payer: Self-pay | Admitting: Family Medicine

## 2021-09-13 LAB — COMPREHENSIVE METABOLIC PANEL
ALT: 8 IU/L (ref 0–32)
AST: 14 IU/L (ref 0–40)
Albumin/Globulin Ratio: 2.1 (ref 1.2–2.2)
Albumin: 4.7 g/dL (ref 3.8–4.9)
Alkaline Phosphatase: 109 IU/L (ref 44–121)
BUN/Creatinine Ratio: 14 (ref 9–23)
BUN: 16 mg/dL (ref 6–24)
Bilirubin Total: 0.2 mg/dL (ref 0.0–1.2)
CO2: 22 mmol/L (ref 20–29)
Calcium: 9.1 mg/dL (ref 8.7–10.2)
Chloride: 90 mmol/L — ABNORMAL LOW (ref 96–106)
Creatinine, Ser: 1.14 mg/dL — ABNORMAL HIGH (ref 0.57–1.00)
Globulin, Total: 2.2 g/dL (ref 1.5–4.5)
Glucose: 117 mg/dL — ABNORMAL HIGH (ref 70–99)
Potassium: 4.1 mmol/L (ref 3.5–5.2)
Sodium: 128 mmol/L — ABNORMAL LOW (ref 134–144)
Total Protein: 6.9 g/dL (ref 6.0–8.5)
eGFR: 56 mL/min/{1.73_m2} — ABNORMAL LOW (ref >59)

## 2021-09-13 LAB — TSH: TSH: 1.24 u[IU]/mL (ref 0.450–4.500)

## 2021-09-13 LAB — LIPID PANEL W/O CHOL/HDL RATIO
Cholesterol, Total: 198 mg/dL (ref 100–199)
HDL: 110 mg/dL (ref >39)
LDL Chol Calc (NIH): 74 mg/dL (ref 0–99)
Triglycerides: 77 mg/dL (ref 0–149)
VLDL Cholesterol Cal: 14 mg/dL (ref 5–40)

## 2021-09-13 LAB — CBC WITH DIFFERENTIAL/PLATELET
Basophils Absolute: 0.1 10*3/uL (ref 0.0–0.2)
Basos: 1 %
EOS (ABSOLUTE): 0.1 10*3/uL (ref 0.0–0.4)
Eos: 1 %
Hematocrit: 36.5 % (ref 34.0–46.6)
Hemoglobin: 12.6 g/dL (ref 11.1–15.9)
Immature Grans (Abs): 0.1 10*3/uL (ref 0.0–0.1)
Immature Granulocytes: 1 %
Lymphocytes Absolute: 3.1 10*3/uL (ref 0.7–3.1)
Lymphs: 36 %
MCH: 33.2 pg — ABNORMAL HIGH (ref 26.6–33.0)
MCHC: 34.5 g/dL (ref 31.5–35.7)
MCV: 96 fL (ref 79–97)
Monocytes Absolute: 0.8 10*3/uL (ref 0.1–0.9)
Monocytes: 9 %
Neutrophils Absolute: 4.5 10*3/uL (ref 1.4–7.0)
Neutrophils: 52 %
Platelets: 228 10*3/uL (ref 150–450)
RBC: 3.79 x10E6/uL (ref 3.77–5.28)
RDW: 12.2 % (ref 11.7–15.4)
WBC: 8.5 10*3/uL (ref 3.4–10.8)

## 2021-09-13 NOTE — Assessment & Plan Note (Signed)
Not doing well. Will increase her lexapro to 10mg  and recheck 1 month. Call with any concerns.

## 2021-09-13 NOTE — Progress Notes (Signed)
BP (!) 94/59    Pulse 99    Temp 98.4 F (36.9 C) (Oral)    Ht 5' 1.6" (1.565 m)    Wt 124 lb 6.4 oz (56.4 kg)    LMP 06/17/2011    SpO2 95%    BMI 23.05 kg/m    Subjective:    Patient ID: Jill Poole, female    DOB: 10-21-1960, 61 y.o.   MRN: [id number]  HPI: Jill Poole is a 61 y.o. female  Chief Complaint  Patient presents with   Anxiety   Hypertension   URI    Pt states she has had a cold for the past 3 weeks. States she has had scratchy throat and some congestion.    UPPER RESPIRATORY TRACT INFECTION Duration: about 3 weeks Worst symptom: congestion, cough Fever: yes Cough: yes Shortness of breath: yes Wheezing: no Chest pain: yes, with cough Chest tightness: yes Chest congestion: yes Nasal congestion: yes Runny nose: no Post nasal drip: yes Sneezing: no Sore throat: yes Swollen glands: no Sinus pressure: no Headache: no Face pain: no Toothache: no Ear pain: no  Ear pressure: yes left Eyes red/itching:no Eye drainage/crusting: no  Vomiting: no Rash: no Fatigue: yes Sick contacts: no Strep contacts: no  Context: worse Recurrent sinusitis: no Relief with OTC cold/cough medications: no  Treatments attempted: none   HYPERTENSION Hypertension status:  overtreated   Satisfied with current treatment? yes Duration of hypertension: chronic BP monitoring frequency:  not checking BP medication side effects:  no Medication compliance: excellent compliance Previous BP meds:lisinopril Aspirin: yes Recurrent headaches: no Visual changes: no Palpitations: no Dyspnea: yes Chest pain: no Lower extremity edema: no Dizzy/lightheaded: no  ANXIETY/DEPRESSION- her roommate had an injury so she has been doing more housework,  Duration: chronic Status:exacerbated Anxious mood: no  Excessive worrying: yes Irritability: yes  Sweating: no Nausea: no Palpitations:no Hyperventilation: no Panic attacks: no Agoraphobia: no  Obscessions/compulsions:  no Depressed mood: yes Depression screen [facility] 2/9 09/12/2021 03/21/2021 12/20/2020 07/27/2020 04/14/2020  Decreased Interest '3 1 3 ' - 0  Down, Depressed, Hopeless 3 0 2 1 0  PHQ - 2 Score '6 1 5 1 ' 0  Altered sleeping '3 3 2 3 3  ' Tired, decreased energy 1 0 3 - 3  Change in appetite '3 1 3 3 ' 0  Feeling bad or failure about yourself  0 0 0 0 0  Trouble concentrating 0 0 0 0 0  Moving slowly or fidgety/restless 0 0 0 0 0  Suicidal thoughts 0 0 0 0 0  PHQ-9 Score '13 5 13 7 6  ' Difficult doing work/chores Extremely dIfficult Very difficult Somewhat difficult - Not difficult at all  Some recent data might be hidden   Anhedonia: no Weight changes: no Insomnia: yes hard to fall asleep  Hypersomnia: no Fatigue/loss of energy: yes Feelings of worthlessness: yes Feelings of guilt: yes Impaired concentration/indecisiveness: yes Suicidal ideations: no  Crying spells: yes Recent Stressors/Life Changes: yes   Relationship problems: yes   Family stress: no     Financial stress: no    Job stress: no    Recent death/loss: no  Relevant past medical, surgical, family and social history reviewed and updated as indicated. Interim medical history since our last visit reviewed. Allergies and medications reviewed and updated.  Review of Systems  Per HPI unless specifically indicated above     Objective:    BP (!) 94/59    Pulse 99    Temp 98.4 F (36.9 C) (  Oral)    Ht 5' 1.6" (1.565 m)    Wt 124 lb 6.4 oz (56.4 kg)    LMP 06/17/2011    SpO2 95%    BMI 23.05 kg/m   Wt Readings from Last 3 Encounters:  09/12/21 124 lb 6.4 oz (56.4 kg)  03/21/21 125 lb 6.4 oz (56.9 kg)  12/20/20 122 lb 6.4 oz (55.5 kg)    Physical Exam Vitals and nursing note reviewed.  Constitutional:      General: She is not in acute distress.    Appearance: Normal appearance. She is not ill-appearing, toxic-appearing or diaphoretic.  HENT:     Head: Normocephalic and atraumatic.     Right Ear: External ear normal.     Left  Ear: External ear normal.     Nose: Nose normal.     Mouth/Throat:     Mouth: Mucous membranes are moist.     Pharynx: Oropharynx is clear.  Eyes:     General: No scleral icterus.       Right eye: No discharge.        Left eye: No discharge.     Extraocular Movements: Extraocular movements intact.     Conjunctiva/sclera: Conjunctivae normal.     Pupils: Pupils are equal, round, and reactive to light.  Cardiovascular:     Rate and Rhythm: Normal rate and regular rhythm.     Pulses: Normal pulses.     Heart sounds: Normal heart sounds. No murmur heard.   No friction rub. No gallop.  Pulmonary:     Effort: Pulmonary effort is normal. No respiratory distress.     Breath sounds: Normal breath sounds. No stridor. No wheezing, rhonchi or rales.  Chest:     Chest wall: No tenderness.  Musculoskeletal:        General: Normal range of motion.     Cervical back: Normal range of motion and neck supple.  Skin:    General: Skin is warm and dry.     Capillary Refill: Capillary refill takes less than 2 seconds.     Coloration: Skin is not jaundiced or pale.     Findings: No bruising, erythema, lesion or rash.  Neurological:     General: No focal deficit present.     Mental Status: She is alert and oriented to person, place, and time. Mental status is at baseline.  Psychiatric:        Mood and Affect: Mood normal.        Behavior: Behavior normal.        Thought Content: Thought content normal.        Judgment: Judgment normal.    Results for orders placed or performed in visit on 09/12/21  Microscopic Examination   Urine  Result Value Ref Range   WBC, UA 0-5 0 - 5 /hpf   RBC None seen 0 - 2 /hpf   Epithelial Cells (non renal) 0-10 0 - 10 /hpf   Bacteria, UA Moderate (A) None seen/Few  CBC with Differential/Platelet  Result Value Ref Range   WBC 8.5 3.4 - 10.8 x10E3/uL   RBC 3.79 3.77 - 5.28 x10E6/uL   Hemoglobin 12.6 11.1 - 15.9 g/dL   Hematocrit 36.5 34.0 - 46.6 %   MCV 96 79 -  97 fL   MCH 33.2 (H) 26.6 - 33.0 pg   MCHC 34.5 31.5 - 35.7 g/dL   RDW 12.2 11.7 - 15.4 %   Platelets 228 150 - 450 x10E3/uL   Neutrophils  52 Not Estab. %   Lymphs 36 Not Estab. %   Monocytes 9 Not Estab. %   Eos 1 Not Estab. %   Basos 1 Not Estab. %   Neutrophils Absolute 4.5 1.4 - 7.0 x10E3/uL   Lymphocytes Absolute 3.1 0.7 - 3.1 x10E3/uL   Monocytes Absolute 0.8 0.1 - 0.9 x10E3/uL   EOS (ABSOLUTE) 0.1 0.0 - 0.4 x10E3/uL   Basophils Absolute 0.1 0.0 - 0.2 x10E3/uL   Immature Granulocytes 1 Not Estab. %   Immature Grans (Abs) 0.1 0.0 - 0.1 x10E3/uL  Comprehensive metabolic panel  Result Value Ref Range   Glucose 117 (H) 70 - 99 mg/dL   BUN 16 6 - 24 mg/dL   Creatinine, Ser 1.14 (H) 0.57 - 1.00 mg/dL   eGFR 56 (L) >59 mL/min/1.73   BUN/Creatinine Ratio 14 9 - 23   Sodium 128 (L) 134 - 144 mmol/L   Potassium 4.1 3.5 - 5.2 mmol/L   Chloride 90 (L) 96 - 106 mmol/L   CO2 22 20 - 29 mmol/L   Calcium 9.1 8.7 - 10.2 mg/dL   Total Protein 6.9 6.0 - 8.5 g/dL   Albumin 4.7 3.8 - 4.9 g/dL   Globulin, Total 2.2 1.5 - 4.5 g/dL   Albumin/Globulin Ratio 2.1 1.2 - 2.2   Bilirubin Total 0.2 0.0 - 1.2 mg/dL   Alkaline Phosphatase 109 44 - 121 IU/L   AST 14 0 - 40 IU/L   ALT 8 0 - 32 IU/L  Lipid Panel w/o Chol/HDL Ratio  Result Value Ref Range   Cholesterol, Total 198 100 - 199 mg/dL   Triglycerides 77 0 - 149 mg/dL   HDL 110 >39 mg/dL   VLDL Cholesterol Cal 14 5 - 40 mg/dL   LDL Chol Calc (NIH) 74 0 - 99 mg/dL  Urinalysis, Routine w reflex microscopic  Result Value Ref Range   Specific Gravity, UA 1.010 1.005 - 1.030   pH, UA 6.5 5.0 - 7.5   Color, UA Yellow Yellow   Appearance Ur Cloudy (A) Clear   Leukocytes,UA Trace (A) Negative   Protein,UA Negative Negative/Trace   Glucose, UA Negative Negative   Ketones, UA Negative Negative   RBC, UA Negative Negative   Bilirubin, UA Negative Negative   Urobilinogen, Ur 0.2 0.2 - 1.0 mg/dL   Nitrite, UA Negative Negative   Microscopic  Examination See below:   TSH  Result Value Ref Range   TSH 1.240 0.450 - 4.500 uIU/mL  Microalbumin, Urine Waived  Result Value Ref Range   Microalb, Ur Waived 10 0 - 19 mg/L   Creatinine, Urine Waived 50 10 - 300 mg/dL   Microalb/Creat Ratio <30 <30 mg/g      Assessment & Plan:   Problem List Items Addressed This Visit       Cardiovascular and Mediastinum   HTN (hypertension)    Over treated. Will decrease her lisinopril to 28m and recheck in a couple of weeks. Continue to monitor.       Relevant Medications   lisinopril (ZESTRIL) 20 MG tablet   Other Relevant Orders   CBC with Differential/Platelet (Completed)   Comprehensive metabolic panel (Completed)   Urinalysis, Routine w reflex microscopic (Completed)   Microalbumin, Urine Waived (Completed)     Respiratory   COPD (chronic obstructive pulmonary disease) (HCC)    Not under good control today. Continue current regimen. Continue to monitor. Call with any concerns. Refills given. Labs drawn today.       Relevant  Medications   fluticasone-salmeterol (ADVAIR) 250-50 MCG/ACT AEPB   Other Relevant Orders   CBC with Differential/Platelet (Completed)   Comprehensive metabolic panel (Completed)   Urinalysis, Routine w reflex microscopic (Completed)     Digestive   GERD (gastroesophageal reflux disease)    Under good control on current regimen. Continue current regimen. Continue to monitor. Call with any concerns. Refills given. Labs drawn today.       Relevant Medications   omeprazole (PRILOSEC) 40 MG capsule   Other Relevant Orders   CBC with Differential/Platelet (Completed)   Comprehensive metabolic panel (Completed)   Urinalysis, Routine w reflex microscopic (Completed)     Other   Anxiety    Not doing well. Will increase her lexapro to 14m and recheck 1 month. Call with any concerns.       Relevant Medications   escitalopram (LEXAPRO) 10 MG tablet   hydrOXYzine (ATARAX) 25 MG tablet   LORazepam  (ATIVAN) 0.5 MG tablet   Other Relevant Orders   CBC with Differential/Platelet (Completed)   Comprehensive metabolic panel (Completed)   Urinalysis, Routine w reflex microscopic (Completed)   TSH (Completed)   Moderate episode of recurrent major depressive disorder ([place]    Not doing well. Will increase her lexapro to 142mand recheck 1 month. Call with any concerns.       Relevant Medications   escitalopram (LEXAPRO) 10 MG tablet   hydrOXYzine (ATARAX) 25 MG tablet   LORazepam (ATIVAN) 0.5 MG tablet   Other Relevant Orders   CBC with Differential/Platelet (Completed)   Comprehensive metabolic panel (Completed)   Urinalysis, Routine w reflex microscopic (Completed)   TSH (Completed)   Other Visit Diagnoses     COPD exacerbation (HCAval[place]   -  Primary   Declines steroids. Will do nebs 3x a day for next few days. If not improving in 2-3 days, let usKoreanow and we'll send abx.    Relevant Medications   fluticasone-salmeterol (ADVAIR) 250-50 MCG/ACT AEPB   albuterol (PROVENTIL) (2.5 MG/3ML) 0.083% nebulizer solution 2.5 mg (Completed)   Screening for cholesterol level       Labs drawn today. Await results.    Relevant Orders   CBC with Differential/Platelet (Completed)   Comprehensive metabolic panel (Completed)   Lipid Panel w/o Chol/HDL Ratio (Completed)   Urinalysis, Routine w reflex microscopic (Completed)   Need for influenza vaccination       Flu shot given today.   Relevant Orders   Flu Vaccine QUAD 14m11mo (Fluarix, Fluzone & Alfiuria Quad PF) (Completed)        Follow up plan: Return ASAP Physical.

## 2021-09-14 MED ORDER — DOXYCYCLINE HYCLATE 100 MG PO TABS: 100.0000 mg | ORAL_TABLET | Freq: Two times a day (BID) | ORAL | 0 refills | Status: DC

## 2021-09-14 NOTE — Telephone Encounter (Signed)
Routing to provider  

## 2021-09-14 NOTE — Telephone Encounter (Signed)
Copied from [place] [id number]. Topic: General - Other >> Sep 14, 2021  9:51 AM Holley Dexter N wrote: Reason for CRM: Pt called in stating she spoke with PCP a few days ago and was asked if she wanted to try an antibiotic and pt does want to go ahead and continue to try an antibiotic, pt requested a call back to see about getting medication, please advise.

## 2021-09-28 ENCOUNTER — Encounter: Payer: Self-pay | Admitting: Family Medicine

## 2021-09-28 ENCOUNTER — Other Ambulatory Visit: Payer: Self-pay

## 2021-09-28 ENCOUNTER — Ambulatory Visit (INDEPENDENT_AMBULATORY_CARE_PROVIDER_SITE_OTHER): Payer: Medicaid Other | Admitting: Family Medicine

## 2021-09-28 ENCOUNTER — Other Ambulatory Visit (HOSPITAL_COMMUNITY)
Admission: RE | Admit: 2021-09-28 | Discharge: 2021-09-28 | Disposition: A | Discharge status: Home / Self Care | Payer: Medicaid Other | Source: Ambulatory Visit | Attending: Family Medicine | Admitting: Family Medicine

## 2021-09-28 VITALS — Temp 98.3°F

## 2021-09-28 DIAGNOSIS — Z124 Encounter for screening for malignant neoplasm of cervix: Secondary | ICD-10-CM | POA: Diagnosis not present

## 2021-09-28 DIAGNOSIS — Z1231 Encounter for screening mammogram for malignant neoplasm of breast: Secondary | ICD-10-CM | POA: Diagnosis not present

## 2021-09-28 DIAGNOSIS — I952 Hypotension due to drugs: Secondary | ICD-10-CM

## 2021-09-28 DIAGNOSIS — Z0000 Encounter for general adult medical examination without abnormal findings: Secondary | ICD-10-CM | POA: Diagnosis not present

## 2021-09-28 NOTE — Progress Notes (Signed)
Temp 98.3 F (36.8 C)    LMP 06/17/2011    SpO2 100%    Subjective:    Patient ID: Jill Poole, female    DOB: 1960-12-09, 61 y.o.   MRN: [id number]  HPI: Jill Poole is a 61 y.o. female presenting on 09/28/2021 for comprehensive medical examination. Current medical complaints include: Has had some dizziness the past couple of days- stopped her lisinopril  Menopausal Symptoms: no  Depression Screen done today and results listed below:  Depression screen [facility] 2/9 09/28/2021 09/12/2021 03/21/2021 12/20/2020 07/27/2020  Decreased Interest 0 _0 -  Down, Depressed, Hopeless 1 3 0 2 1  PHQ - 2 Score _1 Altered sleeping _2 Tired, decreased energy 3 1 0 3 -  Change in appetite _3 Feeling bad or failure about yourself  0 0 0 0 0  Trouble concentrating 1 0 0 0 0  Moving slowly or fidgety/restless 0 0 0 0 0  Suicidal thoughts 0 0 0 0 0  PHQ-9 Score _4 Difficult doing work/chores - Extremely dIfficult Very difficult Somewhat difficult -  Some recent data might be hidden   Past Medical History:  Past Medical History:  Diagnosis Date   Asthma    Back pain    Concussion    COPD (chronic obstructive pulmonary disease) (HCC)    DDD (degenerative disc disease), lumbar    Depression    Post traumatic stress disorder (PTSD)     Surgical History:  Past Surgical History:  Procedure Laterality Date   TIBIA FRACTURE SURGERY     TUBAL LIGATION     WRIST SURGERY      Medications:  Current Outpatient Medications on File Prior to Visit  Medication Sig   acetaminophen (TYLENOL) 500 MG tablet Take 500-1,000 mg by mouth every 6 (six) hours as needed for mild pain, moderate pain or fever.   albuterol (VENTOLIN HFA) 108 (90 Base) MCG/ACT inhaler INHALE 2 PUFFS BY MOUTH EVERY 6 HOURS AS NEEDED FOR WHEEZING OR SHORTNESS OF BREATH   cholecalciferol (VITAMIN D3) 25 MCG (1000 UNIT) tablet Take 1,000 Units by mouth daily.   escitalopram (LEXAPRO) 10 MG tablet Take  1 tablet (10 mg total) by mouth daily.   fluticasone (FLONASE) 50 MCG/ACT nasal spray SHAKE LIQUID AND USE 2 SPRAYS IN EACH NOSTRIL DAILY   fluticasone-salmeterol (ADVAIR) 250-50 MCG/ACT AEPB Inhale 1 puff into the lungs in the morning and at bedtime.   hydrOXYzine (ATARAX) 25 MG tablet Take 1-2 tablets (25-50 mg total) by mouth every 8 (eight) hours as needed for anxiety.   LORazepam (ATIVAN) 0.5 MG tablet Take 1 tablet (0.5 mg total) by mouth 2 (two) times daily as needed for anxiety.   naproxen (NAPROSYN) 500 MG tablet Take 1 tablet (500 mg total) by mouth 2 (two) times daily with a meal.   omeprazole (PRILOSEC) 40 MG capsule Take 1 capsule (40 mg total) by mouth daily.   vitamin B-12 (CYANOCOBALAMIN) 1000 MCG tablet Take 1,000 mcg by mouth daily.   lisinopril (ZESTRIL) 20 MG tablet Take 1 tablet (20 mg total) by mouth daily. (Patient not taking: Reported on 09/28/2021)   No current facility-administered medications on file prior to visit.    Allergies:  Allergies  Allergen Reactions   Adenosine    Keflex [Cephalexin] Nausea And Vomiting   Tramadol Nausea And Vomiting   Codeine Nausea And Vomiting  and Other (See Comments)    Hot flashes, throwing up sick.   Hydrocodone-Acetaminophen Nausea Only    Social History:  Social History   Socioeconomic History   Marital status: Single    Spouse name: Not on file   Number of children: Not on file   Years of education: Not on file   Highest education level: Not on file  Occupational History   Not on file  Tobacco Use   Smoking status: Every Day    Packs/day: 1.50    Types: Cigarettes   Smokeless tobacco: Never  Vaping Use   Vaping Use: Never used  Substance and Sexual Activity   Alcohol use: Not Currently    Comment: once every two weeks   Drug use: No   Sexual activity: Never    Birth control/protection: Surgical  Other Topics Concern   Not on file  Social History Narrative   Not on file   Social Determinants of Health    Financial Resource Strain: Not on file  Food Insecurity: Not on file  Transportation Needs: Not on file  Physical Activity: Not on file  Stress: Not on file  Social Connections: Not on file  Intimate Partner Violence: Not on file   Social History   Tobacco Use  Smoking Status Every Day   Packs/day: 1.50   Types: Cigarettes  Smokeless Tobacco Never   Social History   Substance and Sexual Activity  Alcohol Use Not Currently   Comment: once every two weeks    Family History:  Family History  Problem Relation Age of Onset   Arthritis Mother    Hypertension Mother    Hypertension Sister    Diabetes Sister    Cancer Sister    Heart disease Brother    Asthma Brother    Lung cancer Brother    Other Son        Lipoma   Cancer Maternal Grandmother        Stomach   Breast cancer Maternal Aunt 50    Past medical history, surgical history, medications, allergies, family history and social history reviewed with patient today and changes made to appropriate areas of the chart.   Review of Systems  Constitutional: Negative.   HENT: Negative.    Eyes: Negative.   Respiratory: Negative.    Cardiovascular: Negative.   Gastrointestinal:  Positive for constipation. Negative for abdominal pain, blood in stool, diarrhea, heartburn, melena, nausea and vomiting.  Genitourinary: Negative.   Musculoskeletal: Negative.   Skin: Negative.   Neurological:  Positive for dizziness. Negative for tingling, tremors, sensory change, speech change, focal weakness, seizures, loss of consciousness, weakness and headaches.  Endo/Heme/Allergies:  Negative for environmental allergies and polydipsia. Bruises/bleeds easily.  Psychiatric/Behavioral: Negative.    All other ROS negative except what is listed above and in the HPI.      Objective:    Temp 98.3 F (36.8 C)    LMP 06/17/2011    SpO2 100%   Wt Readings from Last 3 Encounters:  09/12/21 124 lb 6.4 oz (56.4 kg)  03/21/21 125 lb 6.4 oz  (56.9 kg)  12/20/20 122 lb 6.4 oz (55.5 kg)    Physical Exam Vitals and nursing note reviewed. Exam conducted with a chaperone present.  Constitutional:      General: She is not in acute distress.    Appearance: Normal appearance. She is not ill-appearing, toxic-appearing or diaphoretic.  HENT:     Head: Normocephalic and atraumatic.     Right  Ear: Tympanic membrane, ear canal and external ear normal. There is no impacted cerumen.     Left Ear: Tympanic membrane, ear canal and external ear normal. There is no impacted cerumen.     Nose: Nose normal. No congestion or rhinorrhea.     Mouth/Throat:     Mouth: Mucous membranes are moist.     Pharynx: Oropharynx is clear. No oropharyngeal exudate or posterior oropharyngeal erythema.  Eyes:     General: No scleral icterus.       Right eye: No discharge.        Left eye: No discharge.     Extraocular Movements: Extraocular movements intact.     Conjunctiva/sclera: Conjunctivae normal.     Pupils: Pupils are equal, round, and reactive to light.  Neck:     Vascular: No carotid bruit.  Cardiovascular:     Rate and Rhythm: Normal rate and regular rhythm.     Pulses: Normal pulses.     Heart sounds: No murmur heard.   No friction rub. No gallop.  Pulmonary:     Effort: Pulmonary effort is normal. No respiratory distress.     Breath sounds: Normal breath sounds. No stridor. No wheezing, rhonchi or rales.  Chest:     Chest wall: No tenderness.  Breasts:    Right: Normal.     Left: Normal.  Abdominal:     General: Abdomen is flat. Bowel sounds are normal. There is no distension.     Palpations: Abdomen is soft. There is no mass.     Tenderness: There is no abdominal tenderness. There is no right CVA tenderness, left CVA tenderness, guarding or rebound.     Hernia: No hernia is present. There is no hernia in the left inguinal area or right inguinal area.  Genitourinary:    Labia:        Right: No rash, tenderness, lesion or injury.         Left: No rash, tenderness, lesion or injury.      Urethra: No prolapse, urethral pain, urethral swelling or urethral lesion.     Vagina: Normal.     Cervix: Friability present. No cervical motion tenderness, discharge, lesion, erythema, cervical bleeding or eversion.     Uterus: Normal.      Adnexa: Right adnexa normal and left adnexa normal.  Musculoskeletal:        General: No swelling, tenderness, deformity or signs of injury.     Cervical back: Normal range of motion and neck supple. No rigidity. No muscular tenderness.     Right lower leg: No edema.     Left lower leg: No edema.  Lymphadenopathy:     Cervical: No cervical adenopathy.     Lower Body: No right inguinal adenopathy. No left inguinal adenopathy.  Skin:    General: Skin is warm and dry.     Capillary Refill: Capillary refill takes less than 2 seconds.     Coloration: Skin is not jaundiced or pale.     Findings: No bruising, erythema, lesion or rash.  Neurological:     General: No focal deficit present.     Mental Status: She is alert and oriented to person, place, and time. Mental status is at baseline.     Cranial Nerves: No cranial nerve deficit.     Sensory: No sensory deficit.     Motor: No weakness.     Coordination: Coordination normal.     Gait: Gait normal.     Deep  Tendon Reflexes: Reflexes normal.  Psychiatric:        Mood and Affect: Mood normal.        Behavior: Behavior normal.        Thought Content: Thought content normal.        Judgment: Judgment normal.    Results for orders placed or performed in visit on 09/12/21  Microscopic Examination   Urine  Result Value Ref Range   WBC, UA 0-5 0 - 5 /hpf   RBC None seen 0 - 2 /hpf   Epithelial Cells (non renal) 0-10 0 - 10 /hpf   Bacteria, UA Moderate (A) None seen/Few  CBC with Differential/Platelet  Result Value Ref Range   WBC 8.5 3.4 - 10.8 x10E3/uL   RBC 3.79 3.77 - 5.28 x10E6/uL   Hemoglobin 12.6 11.1 - 15.9 g/dL   Hematocrit 36.5 34.0  - 46.6 %   MCV 96 79 - 97 fL   MCH 33.2 (H) 26.6 - 33.0 pg   MCHC 34.5 31.5 - 35.7 g/dL   RDW 12.2 11.7 - 15.4 %   Platelets 228 150 - 450 x10E3/uL   Neutrophils 52 Not Estab. %   Lymphs 36 Not Estab. %   Monocytes 9 Not Estab. %   Eos 1 Not Estab. %   Basos 1 Not Estab. %   Neutrophils Absolute 4.5 1.4 - 7.0 x10E3/uL   Lymphocytes Absolute 3.1 0.7 - 3.1 x10E3/uL   Monocytes Absolute 0.8 0.1 - 0.9 x10E3/uL   EOS (ABSOLUTE) 0.1 0.0 - 0.4 x10E3/uL   Basophils Absolute 0.1 0.0 - 0.2 x10E3/uL   Immature Granulocytes 1 Not Estab. %   Immature Grans (Abs) 0.1 0.0 - 0.1 x10E3/uL  Comprehensive metabolic panel  Result Value Ref Range   Glucose 117 (H) 70 - 99 mg/dL   BUN 16 6 - 24 mg/dL   Creatinine, Ser 1.14 (H) 0.57 - 1.00 mg/dL   eGFR 56 (L) >59 mL/min/1.73   BUN/Creatinine Ratio 14 9 - 23   Sodium 128 (L) 134 - 144 mmol/L   Potassium 4.1 3.5 - 5.2 mmol/L   Chloride 90 (L) 96 - 106 mmol/L   CO2 22 20 - 29 mmol/L   Calcium 9.1 8.7 - 10.2 mg/dL   Total Protein 6.9 6.0 - 8.5 g/dL   Albumin 4.7 3.8 - 4.9 g/dL   Globulin, Total 2.2 1.5 - 4.5 g/dL   Albumin/Globulin Ratio 2.1 1.2 - 2.2   Bilirubin Total 0.2 0.0 - 1.2 mg/dL   Alkaline Phosphatase 109 44 - 121 IU/L   AST 14 0 - 40 IU/L   ALT 8 0 - 32 IU/L  Lipid Panel w/o Chol/HDL Ratio  Result Value Ref Range   Cholesterol, Total 198 100 - 199 mg/dL   Triglycerides 77 0 - 149 mg/dL   HDL 110 >39 mg/dL   VLDL Cholesterol Cal 14 5 - 40 mg/dL   LDL Chol Calc (NIH) 74 0 - 99 mg/dL  Urinalysis, Routine w reflex microscopic  Result Value Ref Range   Specific Gravity, UA 1.010 1.005 - 1.030   pH, UA 6.5 5.0 - 7.5   Color, UA Yellow Yellow   Appearance Ur Cloudy (A) Clear   Leukocytes,UA Trace (A) Negative   Protein,UA Negative Negative/Trace   Glucose, UA Negative Negative   Ketones, UA Negative Negative   RBC, UA Negative Negative   Bilirubin, UA Negative Negative   Urobilinogen, Ur 0.2 0.2 - 1.0 mg/dL   Nitrite, UA Negative  Negative  Microscopic Examination See below:   TSH  Result Value Ref Range   TSH 1.240 0.450 - 4.500 uIU/mL  Microalbumin, Urine Waived  Result Value Ref Range   Microalb, Ur Waived 10 0 - 19 mg/L   Creatinine, Urine Waived 50 10 - 300 mg/dL   Microalb/Creat Ratio <30 <30 mg/g      Assessment & Plan:   Problem List Items Addressed This Visit   None Visit Diagnoses     Routine general medical examination at a health care facility    -  Primary   Vaccines up to date. Screening labs checked today. Pap done today. Mammo ordered. Cologuard up to date. Continue diet and exercise. Call with any concerns.    Hypotension due to drugs       Stop lisinopril. Recheck BP in 3-4 weeks.    Screening for cervical cancer       Relevant Orders   Cytology - PAP   Encounter for screening mammogram for malignant neoplasm of breast       Relevant Orders   MM 3D SCREEN BREAST BILATERAL        Follow up plan: Return 3-4 weeks follow up BP.   LABORATORY TESTING:  - Pap smear: pap done  IMMUNIZATIONS:   - Tdap: Tetanus vaccination status reviewed: last tetanus booster within 10 years. - Influenza: Up to date - Pneumovax: Up to date - Prevnar: Not applicable - COVID: Refused - HPV: Not applicable - Shingrix vaccine: Up to date  SCREENING: -Mammogram: Ordered today  - Colonoscopy: Up to date  - Bone Density: Not applicable   PATIENT COUNSELING:   Advised to take 1 mg of folate supplement per day if capable of pregnancy.   Sexuality: Discussed sexually transmitted diseases, partner selection, use of condoms, avoidance of unintended pregnancy  and contraceptive alternatives.   Advised to avoid cigarette smoking.  I discussed with the patient that most people either abstain from alcohol or drink within safe limits (<=14/week and <=4 drinks/occasion for males, <=7/weeks and <= 3 drinks/occasion for females) and that the risk for alcohol disorders and other health effects rises  proportionally with the number of drinks per week and how often a drinker exceeds daily limits.  Discussed cessation/primary prevention of drug use and availability of treatment for abuse.   Diet: Encouraged to adjust caloric intake to maintain  or achieve ideal body weight, to reduce intake of dietary saturated fat and total fat, to limit sodium intake by avoiding high sodium foods and not adding table salt, and to maintain adequate dietary potassium and calcium preferably from fresh fruits, vegetables, and low-fat dairy products.    stressed the importance of regular exercise  Injury prevention: Discussed safety belts, safety helmets, smoke detector, smoking near bedding or upholstery.   Dental health: Discussed importance of regular tooth brushing, flossing, and dental visits.    NEXT PREVENTATIVE PHYSICAL DUE IN 1 YEAR. Return 3-4 weeks follow up BP.

## 2021-09-28 NOTE — Patient Instructions (Signed)
Please call to schedule your mammogram: °[facility]  °Address: [address], [place], [place] [address]  °Phone: [phone] ° °

## 2021-10-01 ENCOUNTER — Other Ambulatory Visit: Payer: Self-pay | Admitting: Family Medicine

## 2021-10-02 NOTE — Telephone Encounter (Signed)
Requested Prescriptions  Pending Prescriptions Disp Refills   naproxen (NAPROSYN) 500 MG tablet [Pharmacy Med Name: NAPROXEN 500MG  TABLETS] 60 tablet 1    Sig: TAKE 1 TABLET(500 MG) BY MOUTH TWICE DAILY WITH A MEAL     Analgesics:  NSAIDS Failed - 10/01/2021  6:40 PM      Failed - Cr in normal range and within 360 days    Creatinine  Date Value Ref Range Status  04/14/2020 73.0 20.0 - 300.0 mg/dL Final   Creatinine, Ser  Date Value Ref Range Status  09/12/2021 1.14 (H) 0.57 - 1.00 mg/dL Final         Passed - HGB in normal range and within 360 days    Hemoglobin  Date Value Ref Range Status  09/12/2021 12.6 11.1 - 15.9 g/dL Final         Passed - Patient is not pregnant      Passed - Valid encounter within last 12 months    Recent Outpatient Visits          4 days ago Routine general medical examination at a health care facility   [facility], [place], DO   2 weeks ago COPD exacerbation [facility])   [place], [place], DO   6 months ago [place], [place], DO   9 months ago Primary hypertension   [place], Megan P, DO   10 months ago Seasonal allergic rhinitis due to pollen   [facility], Barbaraann Faster, NP

## 2021-10-03 LAB — CYTOLOGY - PAP
Comment: NEGATIVE
Diagnosis: NEGATIVE
High risk HPV: NEGATIVE

## 2021-10-05 ENCOUNTER — Encounter (INDEPENDENT_AMBULATORY_CARE_PROVIDER_SITE_OTHER): Payer: Self-pay | Admitting: *Deleted

## 2021-10-05 ENCOUNTER — Encounter: Payer: Self-pay | Admitting: Family Medicine

## 2021-10-06 ENCOUNTER — Encounter: Payer: Self-pay | Admitting: Internal Medicine

## 2021-10-06 ENCOUNTER — Telehealth (INDEPENDENT_AMBULATORY_CARE_PROVIDER_SITE_OTHER): Payer: Medicaid Other | Admitting: Internal Medicine

## 2021-10-06 DIAGNOSIS — K0889 Other specified disorders of teeth and supporting structures: Secondary | ICD-10-CM | POA: Diagnosis not present

## 2021-10-06 MED ORDER — IBUPROFEN 600 MG PO TABS: 600.0000 mg | ORAL_TABLET | Freq: Three times a day (TID) | ORAL | 0 refills | Status: AC | PRN

## 2021-10-06 NOTE — Progress Notes (Signed)
LMP 06/17/2011    Subjective:    Patient ID: Jill Poole, female    DOB: 1961-08-20, 61 y.o.   MRN: [id number]  Chief Complaint  Patient presents with   Dental Pain    Patient seen Dentist on 1/31 and was given Amoxicillin and Ibuprofen 800 mg to take for pain. Patient states she has a a cracked tooth on bottom left side and that her ear and her face is swollen    HPI: Jill Poole is a 61 y.o. female  Dental Pain  This is a new (the side of tooth next to her cheek is totally gone - seeing dentist for such, he put her on ibubufen 800 mg tid and amoxicillin) problem. The current episode started in the past 7 days. The problem occurs constantly. The problem has been gradually worsening. The pain is at a severity of 7/10. The pain is moderate. Pertinent negatives include no difficulty swallowing, facial pain, fever, oral bleeding, sinus pressure or thermal sensitivity.   Chief Complaint  Patient presents with   Dental Pain    Patient seen Dentist on 1/31 and was given Amoxicillin and Ibuprofen 800 mg to take for pain. Patient states she has a a cracked tooth on bottom left side and that her ear and her face is swollen    Relevant past medical, surgical, family and social history reviewed and updated as indicated. Interim medical history since our last visit reviewed. Allergies and medications reviewed and updated.  Review of Systems  Constitutional:  Negative for fever.  HENT:  Negative for sinus pressure.    Per HPI unless specifically indicated above     Objective:    LMP 06/17/2011   Wt Readings from Last 3 Encounters:  09/12/21 124 lb 6.4 oz (56.4 kg)  03/21/21 125 lb 6.4 oz (56.9 kg)  12/20/20 122 lb 6.4 oz (55.5 kg)    Physical Exam  Results for orders placed or performed in visit on 09/28/21  Cytology - PAP  Result Value Ref Range   High risk HPV Negative    Adequacy      Satisfactory for evaluation; transformation zone component PRESENT.   Diagnosis      -  Negative for intraepithelial lesion or malignancy (NILM)   Comment Normal Reference Range HPV - Negative         Current Outpatient Medications:    ibuprofen (ADVIL) 600 MG tablet, Take 1 tablet (600 mg total) by mouth every 8 (eight) hours as needed for mild pain., Disp: 30 tablet, Rfl: 0   acetaminophen (TYLENOL) 500 MG tablet, Take 500-1,000 mg by mouth every 6 (six) hours as needed for mild pain, moderate pain or fever., Disp: , Rfl:    albuterol (VENTOLIN HFA) 108 (90 Base) MCG/ACT inhaler, INHALE 2 PUFFS BY MOUTH EVERY 6 HOURS AS NEEDED FOR WHEEZING OR SHORTNESS OF BREATH, Disp: 18 g, Rfl: 3   cholecalciferol (VITAMIN D3) 25 MCG (1000 UNIT) tablet, Take 1,000 Units by mouth daily., Disp: , Rfl:    escitalopram (LEXAPRO) 10 MG tablet, Take 1 tablet (10 mg total) by mouth daily., Disp: 90 tablet, Rfl: 1   fluticasone (FLONASE) 50 MCG/ACT nasal spray, SHAKE LIQUID AND USE 2 SPRAYS IN EACH NOSTRIL DAILY, Disp: 16 g, Rfl: 6   fluticasone-salmeterol (ADVAIR) 250-50 MCG/ACT AEPB, Inhale 1 puff into the lungs in the morning and at bedtime., Disp: 60 each, Rfl: 12   hydrOXYzine (ATARAX) 25 MG tablet, Take 1-2 tablets (25-50 mg total) by mouth every  8 (eight) hours as needed for anxiety., Disp: 540 tablet, Rfl: 1   lisinopril (ZESTRIL) 20 MG tablet, Take 1 tablet (20 mg total) by mouth daily. (Patient not taking: Reported on 09/28/2021), Disp: 90 tablet, Rfl: 1   LORazepam (ATIVAN) 0.5 MG tablet, Take 1 tablet (0.5 mg total) by mouth 2 (two) times daily as needed for anxiety., Disp: 45 tablet, Rfl: 0   omeprazole (PRILOSEC) 40 MG capsule, Take 1 capsule (40 mg total) by mouth daily., Disp: 90 capsule, Rfl: 1   vitamin B-12 (CYANOCOBALAMIN) 1000 MCG tablet, Take 1,000 mcg by mouth daily., Disp: , Rfl:     Assessment & Plan:  Tooth pain  Will send in more ibuprufen refills, pt has an appt with the dental surgeon for her tooth extraction in 10 days.  Offered to have her come in for a toradol shot but  she says she doesn't have anyone at this time to bring her in  Will need to fu with her dentist's office for further mx and evaluation of her tooth issues.    Problem List Items Addressed This Visit       Other   Tooth ache - Primary     No orders of the defined types were placed in this encounter.    Meds ordered this encounter  Medications   ibuprofen (ADVIL) 600 MG tablet    Sig: Take 1 tablet (600 mg total) by mouth every 8 (eight) hours as needed for mild pain.    Dispense:  30 tablet    Refill:  0     Follow up plan: No follow-ups on file.  This visit was completed via telephone due to the restrictions of the COVID-19 pandemic. All issues as above were discussed and addressed but no physical exam was performed. If it was felt that the patient should be evaluated in the office, they were directed there. The patient verbally consented to this visit. Patient was unable to complete an audio/visual visit due to Technical difficulties, Lack of internet. Due to the catastrophic nature of the COVID-19 pandemic, this visit was done through audio contact only. Location of the patient: home Location of the provider: work Those involved with this call:  Provider: Charlynne Cousins, MD CMA: Frazier Butt, [place] Desk/Registration: Myrlene Broker  Time spent on call:  10 minutes on the phone discussing health concerns. 10 minutes total spent in review of patient's record and preparation of their chart.

## 2021-10-10 ENCOUNTER — Ambulatory Visit: Payer: Self-pay | Admitting: *Deleted

## 2021-10-10 MED ORDER — AMOXICILLIN-POT CLAVULANATE 875-125 MG PO TABS: 1.0000 | ORAL_TABLET | Freq: Two times a day (BID) | ORAL | 0 refills | Status: DC

## 2021-10-10 NOTE — Telephone Encounter (Signed)
Summary: cyst and medication   Pt is having an issue with a cyst and the pt is asking for a refill for pain medication / pt was sent an RX for 600MG  ibuprofen but she would like 800MG s/ the same mgs the dentist prescribed /please advise      Called patient to review medication request change. No answer, mailbox is full , unable to leave message.

## 2021-10-10 NOTE — Telephone Encounter (Signed)
°  Chief Complaint: dose increase Symptoms: tooth ache and infected Frequency: since last week Pertinent Negatives: NA  Disposition: [] ED /[] Urgent Care (no appt availability in office) / [] Appointment(In office/virtual)/ []  [facility]/ [] Home Care/ [] Refused Recommended Disposition /[] [facility]/ [x]  Follow-up with PCP Additional Notes: Pt had VV on 10/06/21 and states ibuprofen 600mg  isnt helping at all. She had 800mg  ibuprofen that barely helped and doesn't have appt with dentist until 10/19/21 for consultation. Pt is asking if 800mg  can be prescribed or something stronger to help with pain until appt.    Reason for Disposition  [1] Caller has URGENT medicine question about med that PCP or specialist prescribed AND [2] triager unable to answer question  Protocols used: Medication Question Call-A-AH

## 2021-10-10 NOTE — Addendum Note (Signed)
Addended by: Valerie Roys on: 10/10/2021 02:08 PM   Modules accepted: Orders

## 2021-10-10 NOTE — Telephone Encounter (Signed)
Seen by Dr. Neomia Dear. Will send in abx. Follow up with dentist ASAP

## 2021-10-11 DIAGNOSIS — E222 Syndrome of inappropriate secretion of antidiuretic hormone: Secondary | ICD-10-CM | POA: Diagnosis not present

## 2021-10-11 DIAGNOSIS — E559 Vitamin D deficiency, unspecified: Secondary | ICD-10-CM | POA: Diagnosis not present

## 2021-10-11 DIAGNOSIS — R809 Proteinuria, unspecified: Secondary | ICD-10-CM | POA: Diagnosis not present

## 2021-10-11 DIAGNOSIS — I129 Hypertensive chronic kidney disease with stage 1 through stage 4 chronic kidney disease, or unspecified chronic kidney disease: Secondary | ICD-10-CM | POA: Diagnosis not present

## 2021-10-11 DIAGNOSIS — N1831 Chronic kidney disease, stage 3a: Secondary | ICD-10-CM | POA: Diagnosis not present

## 2021-11-10 ENCOUNTER — Ambulatory Visit: Payer: Medicaid Other | Admitting: Family Medicine

## 2021-11-30 ENCOUNTER — Ambulatory Visit: Payer: Medicaid Other | Admitting: Family Medicine

## 2022-01-24 ENCOUNTER — Encounter: Payer: Self-pay | Admitting: Family Medicine

## 2022-01-24 ENCOUNTER — Ambulatory Visit: Payer: Medicaid Other | Admitting: Family Medicine

## 2022-01-24 VITALS — BP 133/87 | HR 90 | Temp 98.4°F | Wt 119.6 lb

## 2022-01-24 DIAGNOSIS — F331 Major depressive disorder, recurrent, moderate: Secondary | ICD-10-CM | POA: Diagnosis not present

## 2022-01-24 DIAGNOSIS — F419 Anxiety disorder, unspecified: Secondary | ICD-10-CM

## 2022-01-24 DIAGNOSIS — I10 Essential (primary) hypertension: Secondary | ICD-10-CM

## 2022-01-24 DIAGNOSIS — Z23 Encounter for immunization: Secondary | ICD-10-CM | POA: Diagnosis not present

## 2022-01-24 MED ORDER — OMEPRAZOLE 40 MG PO CPDR: 40.0000 mg | DELAYED_RELEASE_CAPSULE | Freq: Every day | ORAL | 0 refills | Status: AC

## 2022-01-24 MED ORDER — HYDROXYZINE HCL 25 MG PO TABS: 25.0000 mg | ORAL_TABLET | Freq: Three times a day (TID) | ORAL | 0 refills | Status: AC | PRN

## 2022-01-24 MED ORDER — LORAZEPAM 0.5 MG PO TABS: 0.5000 mg | ORAL_TABLET | Freq: Two times a day (BID) | ORAL | 0 refills | Status: AC | PRN

## 2022-01-24 MED ORDER — ESCITALOPRAM OXALATE 10 MG PO TABS: 10.0000 mg | ORAL_TABLET | Freq: Every day | ORAL | 0 refills | Status: AC

## 2022-01-24 NOTE — Assessment & Plan Note (Signed)
In exacerbation due to moving. Continue current regimen. Continue to monitor. Refills given today- should last about 3 months. Call with any concerns.

## 2022-01-24 NOTE — Assessment & Plan Note (Signed)
Doing well off medicine. Continue to monitor. Call with any concerns. Rechecking BMP today.

## 2022-01-24 NOTE — Progress Notes (Signed)
BP 133/87   Pulse 90   Temp 98.4 F (36.9 C)   Wt 119 lb 9.6 oz (54.3 kg)   LMP 06/17/2011   SpO2 99%   BMI 22.16 kg/m    Subjective:    Patient ID: Jill Poole, female    DOB: 16-Jan-1961, 61 y.o.   MRN: [id number]  HPI: Jill Poole is a 61 y.o. female  Chief Complaint  Patient presents with   Hypertension   Anxiety    Patient requesting medication refills for lorazepam    HYPERTENSION Hypertension status: better  Satisfied with current treatment? yes Duration of hypertension: chronic BP monitoring frequency:  not checking BP medication side effects:  no Previous BP meds: lisinopril Aspirin: no Recurrent headaches: no Visual changes: no Palpitations: no Dyspnea: no Chest pain: no Lower extremity edema: no Dizzy/lightheaded: no  ANXIETY/STRESS- has been using her lorazepam almost every day since she has been moving.  Duration: chronic Status:exacerbated Anxious mood: yes  Excessive worrying: yes Irritability: yes  Sweating: no Nausea: no Palpitations:no Hyperventilation: no Panic attacks: no Agoraphobia: no  Obscessions/compulsions: no Depressed mood: yes    01/24/2022   11:27 AM 10/06/2021   10:01 AM 09/28/2021    3:46 PM 09/12/2021    1:16 PM 03/21/2021    2:32 PM  Depression screen PHQ 2/9  Decreased Interest 0 3 0 3 1  Down, Depressed, Hopeless '2 2 1 3 '$ 0  PHQ - 2 Score '2 5 1 6 1  '$ Altered sleeping '3 3 2 3 3  '$ Tired, decreased energy '2 3 3 1 '$ 0  Change in appetite '2 3 1 3 1  '$ Feeling bad or failure about yourself  0 0 0 0 0  Trouble concentrating 0 3 1 0 0  Moving slowly or fidgety/restless 0 3 0 0 0  Suicidal thoughts 0 0 0 0 0  PHQ-9 Score '9 20 8 13 5  '$ Difficult doing work/chores  Not difficult at all  Extremely dIfficult Very difficult      01/24/2022   11:27 AM 10/06/2021   10:03 AM 09/28/2021    3:46 PM 09/12/2021    1:17 PM  GAD 7 : Generalized Anxiety Score  Nervous, Anxious, on Edge 3 0 1 0  Control/stop worrying '1 1 1 '$ 0  Worry too  much - different things 2 0 1 0  Trouble relaxing '2 3 1 3  '$ Restless 2 0 1 0  Easily annoyed or irritable 2 0 2 3  Afraid - awful might happen 0  1 0  Total GAD 7 Score '12  8 6  '$ Anxiety Difficulty Somewhat difficult Not difficult at all Somewhat difficult Extremely difficult   Anhedonia: no Weight changes: no Insomnia: no   Hypersomnia: no Fatigue/loss of energy: yes Feelings of worthlessness: no Feelings of guilt: no Impaired concentration/indecisiveness: no Suicidal ideations: no  Crying spells: no Recent Stressors/Life Changes: yes   Relationship problems: no   Family stress: no     Financial stress: yes    Job stress: no    Recent death/loss: no   Relevant past medical, surgical, family and social history reviewed and updated as indicated. Interim medical history since our last visit reviewed. Allergies and medications reviewed and updated.  Review of Systems  Constitutional: Negative.   Respiratory: Negative.    Cardiovascular: Negative.   Gastrointestinal: Negative.   Musculoskeletal: Negative.   Neurological: Negative.   Psychiatric/Behavioral: Negative.     Per HPI unless specifically indicated above  Objective:    BP 133/87   Pulse 90   Temp 98.4 F (36.9 C)   Wt 119 lb 9.6 oz (54.3 kg)   LMP 06/17/2011   SpO2 99%   BMI 22.16 kg/m   Wt Readings from Last 3 Encounters:  01/24/22 119 lb 9.6 oz (54.3 kg)  09/12/21 124 lb 6.4 oz (56.4 kg)  03/21/21 125 lb 6.4 oz (56.9 kg)    Physical Exam Vitals and nursing note reviewed.  Constitutional:      General: She is not in acute distress.    Appearance: Normal appearance. She is not ill-appearing, toxic-appearing or diaphoretic.  HENT:     Head: Normocephalic and atraumatic.     Right Ear: External ear normal.     Left Ear: External ear normal.     Nose: Nose normal.     Mouth/Throat:     Mouth: Mucous membranes are moist.     Pharynx: Oropharynx is clear.  Eyes:     General: No scleral icterus.        Right eye: No discharge.        Left eye: No discharge.     Extraocular Movements: Extraocular movements intact.     Conjunctiva/sclera: Conjunctivae normal.     Pupils: Pupils are equal, round, and reactive to light.  Cardiovascular:     Rate and Rhythm: Normal rate and regular rhythm.     Pulses: Normal pulses.     Heart sounds: Normal heart sounds. No murmur heard.   No friction rub. No gallop.  Pulmonary:     Effort: Pulmonary effort is normal. No respiratory distress.     Breath sounds: Normal breath sounds. No stridor. No wheezing, rhonchi or rales.  Chest:     Chest wall: No tenderness.  Musculoskeletal:        General: Normal range of motion.     Cervical back: Normal range of motion and neck supple.  Skin:    General: Skin is warm and dry.     Capillary Refill: Capillary refill takes less than 2 seconds.     Coloration: Skin is not jaundiced or pale.     Findings: No bruising, erythema, lesion or rash.  Neurological:     General: No focal deficit present.     Mental Status: She is alert and oriented to person, place, and time. Mental status is at baseline.  Psychiatric:        Mood and Affect: Mood normal.        Behavior: Behavior normal.        Thought Content: Thought content normal.        Judgment: Judgment normal.    Results for orders placed or performed in visit on 09/28/21  Cytology - PAP  Result Value Ref Range   High risk HPV Negative    Adequacy      Satisfactory for evaluation; transformation zone component PRESENT.   Diagnosis      - Negative for intraepithelial lesion or malignancy (NILM)   Comment Normal Reference Range HPV - Negative       Assessment & Plan:   Problem List Items Addressed This Visit       Cardiovascular and Mediastinum   HTN (hypertension)    Doing well off medicine. Continue to monitor. Call with any concerns. Rechecking BMP today.       Relevant Orders   Basic metabolic panel     Other   Anxiety    In  exacerbation  due to moving. Continue current regimen. Continue to monitor. Refills given today- should last about 3 months. Call with any concerns.        Relevant Medications   hydrOXYzine (ATARAX) 25 MG tablet   escitalopram (LEXAPRO) 10 MG tablet   LORazepam (ATIVAN) 0.5 MG tablet   Moderate episode of recurrent major depressive disorder ([place])    In exacerbation due to moving. Continue current regimen. Continue to monitor. Refills given today- should last about 3 months. Call with any concerns.        Relevant Medications   hydrOXYzine (ATARAX) 25 MG tablet   escitalopram (LEXAPRO) 10 MG tablet   LORazepam (ATIVAN) 0.5 MG tablet   Other Visit Diagnoses     Need for shingles vaccine    -  Primary   Relevant Orders   Varicella-zoster vaccine IM (Shingrix) (Completed)        Follow up plan: Return in about 3 months (around 04/26/2022).

## 2022-01-25 LAB — BASIC METABOLIC PANEL
BUN/Creatinine Ratio: 8 — ABNORMAL LOW (ref 9–23)
BUN: 8 mg/dL (ref 6–24)
CO2: 19 mmol/L — ABNORMAL LOW (ref 20–29)
Calcium: 9.1 mg/dL (ref 8.7–10.2)
Chloride: 87 mmol/L — ABNORMAL LOW (ref 96–106)
Creatinine, Ser: 0.96 mg/dL (ref 0.57–1.00)
Glucose: 198 mg/dL — ABNORMAL HIGH (ref 70–99)
Potassium: 4.6 mmol/L (ref 3.5–5.2)
Sodium: 125 mmol/L — ABNORMAL LOW (ref 134–144)
eGFR: 68 mL/min/{1.73_m2} (ref >59)

## 2022-03-05 ENCOUNTER — Telehealth: Payer: Self-pay | Admitting: Family Medicine

## 2022-03-05 DIAGNOSIS — D485 Neoplasm of uncertain behavior of skin: Secondary | ICD-10-CM

## 2022-03-05 NOTE — Telephone Encounter (Signed)
Copied from [id number]. Topic: Referral - Request for Referral >> Mar 05, 2022  2:16 PM Rosanne Ashing P wrote: Has patient seen PCP for this complaint? Yes.    Referral for which specialty: dermatology /spot on chest  Preferred provider/office: Dermatology in [place]  Reason for referral: spot on chest

## 2022-03-20 ENCOUNTER — Telehealth: Payer: Self-pay | Admitting: Family Medicine

## 2022-03-20 NOTE — Telephone Encounter (Signed)
On call record- see scanned document.   Patient states that she fell and broke her ankle and needs to get preauth from [place] to go to ER.   Patient needs to go to ER or Ortho walk in. Unclear what they need. Can we check on this please.

## 2022-03-22 NOTE — Telephone Encounter (Signed)
Attempted to contact patient - Jill Poole.

## 2022-04-26 ENCOUNTER — Ambulatory Visit: Payer: Medicaid Other | Admitting: Family Medicine

## 2022-11-01 ENCOUNTER — Encounter: Payer: Self-pay | Admitting: Radiology

## 2023-01-09 ENCOUNTER — Ambulatory Visit: Payer: 59 | Admitting: Dermatology
